# Patient Record
Sex: Female | Born: 1965 | Race: Black or African American | Hispanic: No | Marital: Married | Smoking: Current every day smoker
Health system: Southern US, Community
[De-identification: ages and names within clinical notes are randomized; demographics above are authoritative.]

## PROBLEM LIST (undated history)

## (undated) ENCOUNTER — Emergency Department (HOSPITAL_COMMUNITY): Admission: EM | Source: Home / Self Care

## (undated) DIAGNOSIS — T7840XA Allergy, unspecified, initial encounter: Secondary | ICD-10-CM

## (undated) DIAGNOSIS — I10 Essential (primary) hypertension: Secondary | ICD-10-CM

## (undated) DIAGNOSIS — D649 Anemia, unspecified: Secondary | ICD-10-CM

## (undated) DIAGNOSIS — C801 Malignant (primary) neoplasm, unspecified: Secondary | ICD-10-CM

## (undated) DIAGNOSIS — R7303 Prediabetes: Secondary | ICD-10-CM

## (undated) DIAGNOSIS — O00109 Unspecified tubal pregnancy without intrauterine pregnancy: Secondary | ICD-10-CM

## (undated) DIAGNOSIS — Z789 Other specified health status: Secondary | ICD-10-CM

## (undated) HISTORY — DX: Anemia, unspecified: D64.9

## (undated) HISTORY — DX: Malignant (primary) neoplasm, unspecified: C80.1

## (undated) HISTORY — PX: COLONOSCOPY: SHX174

## (undated) HISTORY — PX: OTHER SURGICAL HISTORY: SHX169

## (undated) HISTORY — PX: MOUTH SURGERY: SHX715

## (undated) HISTORY — DX: Essential (primary) hypertension: I10

## (undated) HISTORY — PX: WISDOM TOOTH EXTRACTION: SHX21

## (undated) HISTORY — PX: TUBAL LIGATION: SHX77

## (undated) HISTORY — PX: COLON SURGERY: SHX602

## (undated) HISTORY — DX: Unspecified tubal pregnancy without intrauterine pregnancy: O00.109

## (undated) HISTORY — DX: Allergy, unspecified, initial encounter: T78.40XA

---

## 1998-04-10 ENCOUNTER — Encounter: Payer: Self-pay | Admitting: Emergency Medicine

## 1998-04-10 ENCOUNTER — Emergency Department (HOSPITAL_COMMUNITY): Admission: EM | Admit: 1998-04-10 | Discharge: 1998-04-10 | Payer: Self-pay | Admitting: Emergency Medicine

## 2002-05-15 ENCOUNTER — Emergency Department (HOSPITAL_COMMUNITY): Admission: EM | Admit: 2002-05-15 | Discharge: 2002-05-15 | Payer: Self-pay | Admitting: *Deleted

## 2002-05-15 ENCOUNTER — Encounter: Payer: Self-pay | Admitting: *Deleted

## 2002-10-23 ENCOUNTER — Encounter: Admission: RE | Admit: 2002-10-23 | Discharge: 2002-10-23 | Payer: Self-pay | Admitting: Obstetrics and Gynecology

## 2003-06-06 ENCOUNTER — Emergency Department (HOSPITAL_COMMUNITY): Admission: EM | Admit: 2003-06-06 | Discharge: 2003-06-06 | Payer: Self-pay | Admitting: Emergency Medicine

## 2003-07-09 ENCOUNTER — Inpatient Hospital Stay (HOSPITAL_COMMUNITY): Admission: AD | Admit: 2003-07-09 | Discharge: 2003-07-09 | Payer: Self-pay | Admitting: *Deleted

## 2003-12-03 ENCOUNTER — Ambulatory Visit (HOSPITAL_COMMUNITY): Admission: RE | Admit: 2003-12-03 | Discharge: 2003-12-03 | Payer: Self-pay | Admitting: *Deleted

## 2008-04-12 ENCOUNTER — Ambulatory Visit (HOSPITAL_COMMUNITY): Admission: RE | Admit: 2008-04-12 | Discharge: 2008-04-12 | Payer: Self-pay | Admitting: Obstetrics

## 2008-04-26 ENCOUNTER — Ambulatory Visit (HOSPITAL_COMMUNITY): Admission: RE | Admit: 2008-04-26 | Discharge: 2008-04-26 | Payer: Self-pay | Admitting: Obstetrics

## 2011-06-27 ENCOUNTER — Inpatient Hospital Stay (HOSPITAL_COMMUNITY): Payer: Self-pay

## 2011-06-27 ENCOUNTER — Encounter (HOSPITAL_COMMUNITY): Payer: Self-pay | Admitting: *Deleted

## 2011-06-27 ENCOUNTER — Inpatient Hospital Stay (HOSPITAL_COMMUNITY)
Admission: AD | Admit: 2011-06-27 | Discharge: 2011-06-27 | Disposition: A | Discharge status: Home / Self Care | Payer: Self-pay | Source: Ambulatory Visit | Attending: Obstetrics and Gynecology | Admitting: Obstetrics and Gynecology

## 2011-06-27 DIAGNOSIS — N946 Dysmenorrhea, unspecified: Secondary | ICD-10-CM | POA: Insufficient documentation

## 2011-06-27 DIAGNOSIS — R1031 Right lower quadrant pain: Secondary | ICD-10-CM | POA: Insufficient documentation

## 2011-06-27 DIAGNOSIS — D259 Leiomyoma of uterus, unspecified: Secondary | ICD-10-CM | POA: Insufficient documentation

## 2011-06-27 DIAGNOSIS — N945 Secondary dysmenorrhea: Secondary | ICD-10-CM

## 2011-06-27 HISTORY — DX: Other specified health status: Z78.9

## 2011-06-27 LAB — HCG, SERUM, QUALITATIVE: Preg, Serum: NEGATIVE

## 2011-06-27 LAB — URINALYSIS, ROUTINE W REFLEX MICROSCOPIC
Bilirubin Urine: NEGATIVE
Specific Gravity, Urine: 1.025 (ref 1.005–1.030)
pH: 6 (ref 5.0–8.0)

## 2011-06-27 LAB — CBC
Hemoglobin: 12.4 g/dL (ref 12.0–15.0)
MCH: 28.5 pg (ref 26.0–34.0)
MCHC: 34.2 g/dL (ref 30.0–36.0)
RDW: 14.6 % (ref 11.5–15.5)

## 2011-06-27 LAB — URINE MICROSCOPIC-ADD ON

## 2011-06-27 MED ORDER — OXYCODONE-ACETAMINOPHEN 5-325 MG PO TABS
2.0000 | ORAL_TABLET | Freq: Once | ORAL | Status: AC
  Administered 2011-06-27: 2 via ORAL
  Filled 2011-06-27: qty 2

## 2011-06-27 MED ORDER — OXYCODONE-ACETAMINOPHEN 5-325 MG PO TABS: 2.0000 | ORAL_TABLET | Freq: Four times a day (QID) | ORAL | Status: AC | PRN

## 2011-06-27 MED ORDER — PROMETHAZINE HCL 25 MG/ML IJ SOLN
12.5000 mg | Freq: Once | INTRAMUSCULAR | Status: AC
  Administered 2011-06-27: 12.5 mg via INTRAMUSCULAR
  Filled 2011-06-27: qty 1

## 2011-06-27 MED ORDER — MORPHINE SULFATE 4 MG/ML IJ SOLN
2.0000 mg | Freq: Once | INTRAMUSCULAR | Status: AC
  Administered 2011-06-27: 2 mg via INTRAMUSCULAR
  Filled 2011-06-27: qty 1

## 2011-06-27 NOTE — ED Provider Notes (Signed)
Chief Complaint:  Abdominal Pain   Shelby Wyatt is  46 y.o. No obstetric history on file..  Patient's last menstrual period was 06/25/2011.Marland Kitchen  Her pregnancy status is negative.  She presents complaining of Abdominal Pain . Onset is described as sudden and has been present for  2 hours. Pt reports right-sided abd pain that feels "just like 2 previous ectopic pregnancies". States period started on time. Changing pad 3-4 times daily. Reports dx of fibroids by Dr. Gaynell Face in 2009.  Obstetrical/Gynecological History: [id number]  1992: Ectopic  2009: Ectopic  Past Medical History: Past Medical History  Diagnosis Date  . No pertinent past medical history     Past Surgical History: Past Surgical History  Procedure Date  . Laporoscopy     Family History: Family History  Problem Relation Age of Onset  . Diabetes Mother     Social History: History  Substance Use Topics  . Smoking status: Current Everyday Smoker  . Smokeless tobacco: Not on file  . Alcohol Use: No    Allergies: No Known Allergies  Prescriptions prior to admission  Medication Sig Dispense Refill  . naproxen sodium (ANAPROX) 220 MG tablet Take 220 mg by mouth 2 (two) times daily with a meal. pain        Review of Systems - Gastrointestinal ROS: positive for - abdominal pain negative for - nausea/vomiting Genito-Urinary ROS: no dysuria, trouble voiding, or hematuria positive for - menses  Physical Exam   Blood pressure 137/77, pulse 80, temperature 99.4 F (37.4 C), resp. rate 18, height 5\' 2"  (1.575 m), last menstrual period 06/25/2011.  General: General appearance - alert, well appearing, and in no distress, oriented to person, place, and time, overweight and uncomfortable appearing Mental status - alert, oriented to person, place, and time, normal mood, behavior, speech, dress, motor activity, and thought processes, affect appropriate to mood Abdomen - tenderness noted diffuse, moderate, soft abd.  +BS no CVA tenderness Pelvic - small amount of blood noted c/w mense  Labs: Recent Results (from the past 24 hour(s))  URINALYSIS, ROUTINE W REFLEX MICROSCOPIC   Collection Time   06/27/11  9:10 PM      Component Value Range   Color, Urine YELLOW  YELLOW    APPearance HAZY (*) CLEAR    Specific Gravity, Urine 1.025  1.005 - 1.030    pH 6.0  5.0 - 8.0    Glucose, UA NEGATIVE  NEGATIVE (mg/dL)   Hgb urine dipstick TRACE (*) NEGATIVE    Bilirubin Urine NEGATIVE  NEGATIVE    Ketones, ur 15 (*) NEGATIVE (mg/dL)   Protein, ur NEGATIVE  NEGATIVE (mg/dL)   Urobilinogen, UA 1.0  0.0 - 1.0 (mg/dL)   Nitrite NEGATIVE  NEGATIVE    Leukocytes, UA NEGATIVE  NEGATIVE   URINE MICROSCOPIC-ADD ON   Collection Time   06/27/11  9:10 PM      Component Value Range   Squamous Epithelial / LPF RARE  RARE    WBC, UA 0-2  <3 (WBC/hpf)   RBC / HPF 0-2  <3 (RBC/hpf)   Urine-Other MUCOUS PRESENT    CBC   Collection Time   06/27/11  9:15 PM      Component Value Range   WBC 7.8  4.0 - 10.5 (K/uL)   RBC 4.35  3.87 - 5.11 (MIL/uL)   Hemoglobin 12.4  12.0 - 15.0 (g/dL)   HCT 28.4  13.2 - 44.0 (%)   MCV 83.4  78.0 - 100.0 (fL)  MCH 28.5  26.0 - 34.0 (pg)   MCHC 34.2  30.0 - 36.0 (g/dL)   RDW 14.7  82.9 - 56.2 (%)   Platelets 216  150 - 400 (K/uL)  HCG, SERUM, QUALITATIVE   Collection Time   06/27/11  9:15 PM      Component Value Range   Preg, Serum NEGATIVE  NEGATIVE    Imaging Studies:  *RADIOLOGY REPORT*  Clinical Data: Uterine fibroids, right lower quadrant pain  TRANSABDOMINAL AND TRANSVAGINAL ULTRASOUND OF PELVIS Technique: Both transabdominal and transvaginal ultrasound examinations of the pelvis were performed. Transabdominal technique was performed for global imaging of the pelvis including uterus, ovaries, adnexal regions, and pelvic cul-de-sac.  Comparison: 07/09/2003 It was necessary to proceed with endovaginal exam following the transabdominal exam to visualize the uterus and  better detail.  Findings: Uterus: Uterus measures 10.1 x 6.0 x 7.7 cm. Numerous uterine fibroids noted. Largest uterine fibroid is on the right side and fundal in location measuring 3.6 x 3.3 x 4.2 cm. At least six total fibroids demonstrated which are predominately 3 cm or less in size.  Endometrium: Normal appearance. 3.7 mm thickness.  Right ovary: Measures 2.2 x 1.2 x 1.2 cm. No focal abnormality. Small follicles noted.  Left ovary: Measures 2.5 x 1.2 x 2.3 cm. No focal abnormality.  Trace physiologic free fluid.  Other findings: No free fluid  IMPRESSION: Multiple uterine fibroids.  Normal endometrium and ovaries.  Trace physiologic free fluid.  No acute finding by ultrasound.  Original Report Authenticated By: Judie Petit. Ruel Favors, M.D.   Assessment: Fibroid Uterus Dysmenorrea  Plan: Discharge home Rx pain percocet FU with Dr. Gaynell Face or [facility] E. 06/27/2011,11:03 PM

## 2011-06-27 NOTE — Progress Notes (Signed)
Pt states "I feel like I am having another ectopic pregnancy", pain rt lower abd off/on since yesterday, eased up and came back around 1830, period started 01/11 and became heavy yesterday. Changing a pad about every 2 1/2 hours. LMP in December 15. History of ectopic x 2, has had one on each side but did not remove tubes or ovaries.

## 2011-06-30 NOTE — ED Provider Notes (Signed)
Attestation of Attending Supervision of Advanced Practitioner: Evaluation and management procedures were performed by the PA/NP/CNM/OB Fellow under my supervision/collaboration. Chart reviewed and agree with management and plan.  Akya Fiorello V 06/30/2011 11:58 AM

## 2014-04-15 ENCOUNTER — Encounter (HOSPITAL_COMMUNITY): Payer: Self-pay | Admitting: *Deleted

## 2018-01-28 ENCOUNTER — Ambulatory Visit (INDEPENDENT_AMBULATORY_CARE_PROVIDER_SITE_OTHER): Payer: BLUE CROSS/BLUE SHIELD

## 2018-01-28 ENCOUNTER — Ambulatory Visit (INDEPENDENT_AMBULATORY_CARE_PROVIDER_SITE_OTHER): Payer: BLUE CROSS/BLUE SHIELD | Admitting: Podiatry

## 2018-01-28 ENCOUNTER — Ambulatory Visit: Payer: BLUE CROSS/BLUE SHIELD

## 2018-01-28 ENCOUNTER — Other Ambulatory Visit: Payer: Self-pay | Admitting: Podiatry

## 2018-01-28 ENCOUNTER — Encounter: Payer: Self-pay | Admitting: Podiatry

## 2018-01-28 DIAGNOSIS — M722 Plantar fascial fibromatosis: Secondary | ICD-10-CM

## 2018-01-28 MED ORDER — METHYLPREDNISOLONE 4 MG PO TBPK: ORAL_TABLET | ORAL | 0 refills | Status: DC

## 2018-01-28 MED ORDER — MELOXICAM 15 MG PO TABS: 15.0000 mg | ORAL_TABLET | Freq: Every day | ORAL | 1 refills | Status: DC

## 2018-02-01 NOTE — Progress Notes (Signed)
   Subjective: 52 year old female presenting today as a new patient with a chief complaint of bilateral heel pain that began a few years ago. She states the left heel is worse than the right and the pain has worsened over the past year. She states the pain is worse when standing after sitting for a long period of time. Walking and standing for long periods of time also increases the pain. She has been applying ice therapy with some temporary relief. Patient is here for further evaluation and treatment.   Past Medical History:  Diagnosis Date  . No pertinent past medical history      Objective: Physical Exam General: The patient is alert and oriented x3 in no acute distress.  Dermatology: Skin is warm, dry and supple bilateral lower extremities. Negative for open lesions or macerations bilateral.   Vascular: Dorsalis Pedis and Posterior Tibial pulses palpable bilateral.  Capillary fill time is immediate to all digits.  Neurological: Epicritic and protective threshold intact bilateral.   Musculoskeletal: Tenderness to palpation to the plantar aspect of the bilateral heels along the plantar fascia. All other joints range of motion within normal limits bilateral. Strength 5/5 in all groups bilateral.   Radiographic exam: Normal osseous mineralization. Joint spaces preserved. No fracture/dislocation/boney destruction. No other soft tissue abnormalities or radiopaque foreign bodies.   Assessment: 1. plantar fasciitis bilateral feet  Plan of Care:  1. Patient evaluated. Xrays reviewed.   2. Injection of 0.5cc Celestone soluspan injected into the bilateral heels.  3. Rx for Medrol Dose Pak placed 4. Rx for Meloxicam ordered for patient. 5. Plantar fascial band(s) dispensed for bilateral plantar fasciitis. 6. Instructed patient regarding therapies and modalities at home to alleviate symptoms.  7. Return to clinic in 4 weeks.    Edrick Kins, DPM [facility]  Dr. Edrick Kins, DPM    [address], [place] [address]                Office [phone]  Fax [phone]

## 2018-02-27 ENCOUNTER — Ambulatory Visit: Payer: BLUE CROSS/BLUE SHIELD | Admitting: Podiatry

## 2018-03-13 ENCOUNTER — Ambulatory Visit (INDEPENDENT_AMBULATORY_CARE_PROVIDER_SITE_OTHER): Payer: BLUE CROSS/BLUE SHIELD | Admitting: Podiatry

## 2018-03-13 DIAGNOSIS — B353 Tinea pedis: Secondary | ICD-10-CM

## 2018-03-13 DIAGNOSIS — M722 Plantar fascial fibromatosis: Secondary | ICD-10-CM | POA: Diagnosis not present

## 2018-03-13 MED ORDER — TERBINAFINE HCL 250 MG PO TABS: 250.0000 mg | ORAL_TABLET | Freq: Every day | ORAL | 0 refills | Status: DC

## 2018-03-13 MED ORDER — CLOTRIMAZOLE-BETAMETHASONE 1-0.05 % EX CREA: 1.0000 "application " | TOPICAL_CREAM | Freq: Two times a day (BID) | CUTANEOUS | 1 refills | Status: DC

## 2018-03-13 NOTE — Progress Notes (Signed)
   Subjective: 52 year old female presenting today for follow-up treatment evaluation regarding plantar fasciitis to the bilateral feet.  Patient states that she did good for about a month however the pain slowly returned.  Patient recently went to the beach and walked to the [place] walk for several miles.  Is aggravated her plantar fasciitis.  She is currently back to her original pain level. Patient also has a new complaint regarding burning and itching between the toes that is been ongoing for several years.  She notices skin peeling to the plantar aspect of the digits.  Patient denies trauma.  Past Medical History:  Diagnosis Date  . No pertinent past medical history      Objective: Physical Exam General: The patient is alert and oriented x3 in no acute distress.  Dermatology: Skin is warm, dry and supple bilateral lower extremities. Negative for open lesions or macerations bilateral.  Diffuse hyperkeratotic skin noted to the interdigital areas of the bilateral feet with peeling.  Pruritus also noted.  Vascular: Dorsalis Pedis and Posterior Tibial pulses palpable bilateral.  Capillary fill time is immediate to all digits.  Neurological: Epicritic and protective threshold intact bilateral.   Musculoskeletal: Tenderness to palpation to the plantar aspect of the bilateral heels along the plantar fascia. All other joints range of motion within normal limits bilateral. Strength 5/5 in all groups bilateral.   Radiographic exam: Normal osseous mineralization. Joint spaces preserved. No fracture/dislocation/boney destruction. No other soft tissue abnormalities or radiopaque foreign bodies.   Assessment: 1. plantar fasciitis bilateral feet 2.  Tinea pedis bilateral feet  Plan of Care:  1. Patient evaluated. Xrays reviewed.   2. Injection of 0.5cc Celestone soluspan injected into the bilateral heels.  3.  Continue oral meloxicam daily 4.  Appointment with Liliane Channel, Pedorthist, for custom  molded orthotics 5.  Prescription for Lamisil 250 mg x 2 months 6.  Prescription for Lotrisone cream 7.  Return to clinic in 8 weeks  *Husband also states that he is athlete's foot as well  Edrick Kins, DPM [facility]  Dr. Edrick Kins, DPM    [address], [place] [address]                Office [phone]  Fax [phone]

## 2018-03-20 ENCOUNTER — Other Ambulatory Visit: Payer: BLUE CROSS/BLUE SHIELD | Admitting: Orthotics

## 2018-03-20 DIAGNOSIS — M722 Plantar fascial fibromatosis: Secondary | ICD-10-CM | POA: Diagnosis not present

## 2018-04-10 ENCOUNTER — Ambulatory Visit: Payer: BLUE CROSS/BLUE SHIELD | Admitting: Orthotics

## 2018-04-10 DIAGNOSIS — M722 Plantar fascial fibromatosis: Secondary | ICD-10-CM

## 2018-04-10 NOTE — Progress Notes (Signed)
Patient came in today to pick up custom made foot orthotics.  The goals were accomplished and the patient reported no dissatisfaction with said orthotics.  Patient was advised of breakin period and how to report any issues. 

## 2018-04-14 ENCOUNTER — Telehealth: Payer: Self-pay | Admitting: Podiatry

## 2018-04-14 NOTE — Telephone Encounter (Signed)
I spoke with pt and told her she should stay off of the Lamisil and go to a PCP for the rash, which may or may not be related to the terbinafine. Pt states she does not have a PCP and I directed her to the urgent care and family practice on [address].

## 2018-04-14 NOTE — Telephone Encounter (Signed)
I've been taking the terbinafine prescribed by Dr. Amalia Hailey. I read where it said to stop taking if you have stomach problems which I'm experiencing. I stopped taking them about two weeks ago. I know have a rash under my stomach and other areas. I just need to know what to do. I can be reached at [phone].

## 2018-04-17 DIAGNOSIS — T7840XA Allergy, unspecified, initial encounter: Secondary | ICD-10-CM | POA: Diagnosis not present

## 2018-05-08 ENCOUNTER — Ambulatory Visit (INDEPENDENT_AMBULATORY_CARE_PROVIDER_SITE_OTHER): Payer: BLUE CROSS/BLUE SHIELD | Admitting: Podiatry

## 2018-05-08 ENCOUNTER — Encounter: Payer: Self-pay | Admitting: Podiatry

## 2018-05-08 DIAGNOSIS — M722 Plantar fascial fibromatosis: Secondary | ICD-10-CM

## 2018-05-08 DIAGNOSIS — B351 Tinea unguium: Secondary | ICD-10-CM | POA: Diagnosis not present

## 2018-05-08 DIAGNOSIS — B353 Tinea pedis: Secondary | ICD-10-CM | POA: Diagnosis not present

## 2018-05-08 DIAGNOSIS — M79676 Pain in unspecified toe(s): Secondary | ICD-10-CM

## 2018-05-09 NOTE — Progress Notes (Signed)
   Subjective: 52 year old female presenting today for follow-up treatment evaluation regarding plantar fasciitis as well as tinea pedis of bilateral feet. She states the pain has improved but is still present on occasion. She states some days are better than others. She reports getting hives three weeks ago after starting the Lamisil so she discontinued use. She has been using the Lotrisone cream as directed without issue.  She also reports elongated, thickened nails of bilateral feet that cause pain while ambulating in shoes. She is unable to trim her own nails. Patient is here for further evaluation and treatment.   Past Medical History:  Diagnosis Date  . No pertinent past medical history      Objective: Physical Exam General: The patient is alert and oriented x3 in no acute distress.  Dermatology: Skin is warm, dry and supple bilateral lower extremities. Negative for open lesions or macerations bilateral. Diffuse hyperkeratotic skin and pruritis noted to the interdigital areas of the bilateral feet with peeling. Nails are tender, long, thickened and dystrophic with subungual debris, consistent with onychomycosis, 1-5 bilateral. No signs of infection noted.  Vascular: Dorsalis Pedis and Posterior Tibial pulses palpable bilateral.  Capillary fill time is immediate to all digits.  Neurological: Epicritic and protective threshold intact bilateral.   Musculoskeletal: Tenderness to palpation to the plantar aspect of the bilateral heels along the plantar fascia. All other joints range of motion within normal limits bilateral. Strength 5/5 in all groups bilateral.   Assessment: 1. plantar fasciitis bilateral feet 2. Tinea pedis bilateral feet 3. Onychodystrophic nails 1-5 bilateral with hyperkeratosis of nails.  4. Onychomycosis of nail due to dermatophyte bilateral  Plan of Care:  1. Patient evaluated.    2. Continue using custom orthotics.  3. Continue taking Meloxicam.  4.  Discontinue taking Lamisil oral tablets due to allergy.  5. Continue using Lotrisone cream.  6. Mechanical debridement of nails 1-5 bilaterally performed using a nail nipper. Filed with dremel without incident.  7. Return to clinic as needed.   Edrick Kins, DPM [facility]  Dr. Edrick Kins, DPM    [address], [place] [address]                Office [phone]  Fax [phone]

## 2018-05-10 ENCOUNTER — Telehealth: Payer: Self-pay

## 2018-05-10 NOTE — Telephone Encounter (Signed)
Faxed reill request for terbinafine 250mg  denied due to patient allergy

## 2018-10-20 ENCOUNTER — Other Ambulatory Visit: Payer: Self-pay

## 2018-10-20 ENCOUNTER — Emergency Department (HOSPITAL_COMMUNITY)
Admission: EM | Admit: 2018-10-20 | Discharge: 2018-10-21 | Disposition: A | Discharge status: Home / Self Care | Payer: Self-pay | Attending: Emergency Medicine | Admitting: Emergency Medicine

## 2018-10-20 ENCOUNTER — Encounter (HOSPITAL_COMMUNITY): Payer: Self-pay | Admitting: Emergency Medicine

## 2018-10-20 ENCOUNTER — Emergency Department (HOSPITAL_COMMUNITY): Payer: Self-pay

## 2018-10-20 DIAGNOSIS — R0789 Other chest pain: Secondary | ICD-10-CM | POA: Insufficient documentation

## 2018-10-20 DIAGNOSIS — R7309 Other abnormal glucose: Secondary | ICD-10-CM | POA: Insufficient documentation

## 2018-10-20 DIAGNOSIS — F17210 Nicotine dependence, cigarettes, uncomplicated: Secondary | ICD-10-CM | POA: Diagnosis not present

## 2018-10-20 LAB — I-STAT BETA HCG BLOOD, ED (MC, WL, AP ONLY): I-stat hCG, quantitative: <5 m[IU]/mL (ref <5)

## 2018-10-20 MED ORDER — SODIUM CHLORIDE 0.9% FLUSH: 3.0000 mL | Freq: Once | INTRAVENOUS | Status: DC

## 2018-10-20 NOTE — ED Triage Notes (Signed)
Pt c/o chest pain under L breast, intermittent since last night. Slight shob with exertion, denies n/v/d, denies fever.  Pt reports increase in diaphoresis recently.

## 2018-10-21 LAB — CBC
HCT: 35.5 % — ABNORMAL LOW (ref 36.0–46.0)
Hemoglobin: 10.6 g/dL — ABNORMAL LOW (ref 12.0–15.0)
MCH: 21.1 pg — ABNORMAL LOW (ref 26.0–34.0)
MCHC: 29.9 g/dL — ABNORMAL LOW (ref 30.0–36.0)
MCV: 70.7 fL — ABNORMAL LOW (ref 80.0–100.0)
Platelets: 323 10*3/uL (ref 150–400)
RBC: 5.02 MIL/uL (ref 3.87–5.11)
RDW: 19.2 % — ABNORMAL HIGH (ref 11.5–15.5)
WBC: 7.6 10*3/uL (ref 4.0–10.5)
nRBC: 0 % (ref 0.0–0.2)

## 2018-10-21 LAB — BASIC METABOLIC PANEL
Anion gap: 13 (ref 5–15)
BUN: 9 mg/dL (ref 6–20)
CO2: 25 mmol/L (ref 22–32)
Calcium: 9.4 mg/dL (ref 8.9–10.3)
Chloride: 100 mmol/L (ref 98–111)
Creatinine, Ser: 0.75 mg/dL (ref 0.44–1.00)
GFR calc Af Amer: >60 mL/min (ref >60)
GFR calc non Af Amer: >60 mL/min (ref >60)
Glucose, Bld: 154 mg/dL — ABNORMAL HIGH (ref 70–99)
Potassium: 3.6 mmol/L (ref 3.5–5.1)
Sodium: 138 mmol/L (ref 135–145)

## 2018-10-21 LAB — TROPONIN I: Troponin I: <0.03 ng/mL (ref <0.03)

## 2018-10-21 NOTE — ED Provider Notes (Signed)
[facility] EMERGENCY DEPARTMENT Provider Note   CSN: [id number] Arrival date & time: 10/20/18  2237    History   Chief Complaint Chief Complaint  Patient presents with  . Chest Pain    HPI Shelby Wyatt is a 53 y.o. female.   The history is provided by the patient.  Chest Pain  She has no significant past history, and comes in because of episodic chest pain which started yesterday.  She stretched her arms and felt a sharp pain in the left parasternal area which only lasted a few seconds before resolving.  She was fine and till during the day today, she intermittently had pain in the same area.  Pain would be sharp and only last a few seconds before resolving.  There is no radiation of pain.  Pain is rated at 6/10 when it is present.  She denies dyspnea, nausea, diaphoresis.  She denies any fever or cough.  She denies exposure to anyone with COVID-19.  There is no history of diabetes, hypertension, hyperlipidemia.  There is no family history of premature coronary atherosclerosis in first-degree relatives.  She is a cigarette smoker admitting to smoking about half pack of cigarettes a day.  She is never had any pain like this before.  Past Medical History:  Diagnosis Date  . No pertinent past medical history     There are no active problems to display for this patient.   Past Surgical History:  Procedure Laterality Date  . laporoscopy       OB History    Gravida  2   Para      Term      Preterm      AB  2   Living        SAB      TAB      Ectopic  2   Multiple      Live Births               Home Medications    Prior to Admission medications   Medication Sig Start Date End Date Taking? Authorizing Provider  clotrimazole-betamethasone (LOTRISONE) cream Apply 1 application topically 2 (two) times daily. 03/13/18   Edrick Kins, DPM  methylPREDNISolone (MEDROL DOSEPAK) 4 MG TBPK tablet 6 day dose pack - take as directed 01/28/18    Edrick Kins, DPM  naproxen sodium (ALEVE) 220 MG tablet Take 220 mg by mouth.    [provider]  terbinafine (LAMISIL) 250 MG tablet Take 1 tablet (250 mg total) by mouth daily. Patient not taking: Reported on 05/08/2018 03/13/18   Edrick Kins, DPM    Family History Family History  Problem Relation Age of Onset  . Diabetes Mother     Social History Social History   Tobacco Use  . Smoking status: Current Every Day Smoker  . Smokeless tobacco: Never Used  Substance Use Topics  . Alcohol use: No  . Drug use: No     Allergies   Terbinafine and related   Review of Systems Review of Systems  Cardiovascular: Positive for chest pain.  All other systems reviewed and are negative.    Physical Exam Updated Vital Signs BP 131/68 (BP Location: Right Arm)   Pulse 80   Temp 98.4 F (36.9 C)   Resp 18   Ht 5\' 2"  (1.575 m)   Wt 74.8 kg   LMP 10/13/2018   SpO2 100%   BMI 30.18 kg/m   Physical  Exam Vitals signs and nursing note reviewed.    53 year old female, resting comfortably and in no acute distress. Vital signs are normal. Oxygen saturation is 100%, which is normal. Head is normocephalic and atraumatic. PERRLA, EOMI. Oropharynx is clear. Neck is nontender and supple without adenopathy or JVD. Back is nontender and there is no CVA tenderness. Lungs are clear without rales, wheezes, or rhonchi. Chest has point tenderness in the left parasternal area which does reproduce her pain.  There is no crepitus. Heart has regular rate and rhythm without murmur. Abdomen is soft, flat, nontender without masses or hepatosplenomegaly and peristalsis is normoactive. Extremities have no cyanosis or edema, full range of motion is present. Skin is warm and dry without rash. Neurologic: Mental status is normal, cranial nerves are intact, there are no motor or sensory deficits.  ED Treatments / Results  Labs (all labs ordered are listed, but only abnormal results are  displayed) Labs Reviewed  BASIC METABOLIC PANEL - Abnormal; Notable for the following components:      Result Value   Glucose, Bld 154 (*)    All other components within normal limits  CBC - Abnormal; Notable for the following components:   Hemoglobin 10.6 (*)    HCT 35.5 (*)    MCV 70.7 (*)    MCH 21.1 (*)    MCHC 29.9 (*)    RDW 19.2 (*)    All other components within normal limits  TROPONIN I  I-STAT BETA HCG BLOOD, ED ([facility], [facility], [facility] ONLY)    EKG None  Radiology Dg Chest 2 View  Result Date: 10/20/2018 CLINICAL DATA:  Chest pain EXAM: CHEST - 2 VIEW COMPARISON:  None. FINDINGS: The heart size and mediastinal contours are within normal limits. Both lungs are clear. The visualized skeletal structures are unremarkable. IMPRESSION: No active cardiopulmonary disease. Electronically Signed   By: Inez Catalina M.D.   On: 10/20/2018 23:46    Procedures Procedures   Medications Ordered in ED Medications  sodium chloride flush (NS) 0.9 % injection 3 mL (has no administration in time range)     Initial Impression / Assessment and Plan / ED Course  I have reviewed the triage vital signs and the nursing notes.  Pertinent labs & imaging results that were available during my care of the patient were reviewed by me and considered in my medical decision making (see chart for details).  Chest pain which seems to be chest wall pain.  Pain is reproducible with an very well localized tenderness.  Pattern not suggestive of ACS/angina.  Low index of suspicion for pulmonary embolism or aortic dissection, and no risk factors for either of these.  Labs show normal troponin, mild microcytic anemia, borderline hyperglycemia.  Chest x-ray showed no acute process and ECG is unremarkable.  Heart score is 2, which puts her at low risk for major adverse cardiac events in the next 6 weeks.  Patient is advised of this and advised to use over-the-counter NSAIDs as well as topical measures such as ice.  Return  precautions discussed.  Encouraged her to stop smoking.  Final Clinical Impressions(s) / ED Diagnoses   Final diagnoses:  Chest wall pain  Elevated glucose    ED Discharge Orders    None       Delora Fuel, MD 88/41/66 [phone]

## 2018-10-21 NOTE — Discharge Instructions (Addendum)
Apply ice as needed.  Take ibuprofen or naproxen as needed.  Your blood sugar today was high - 154.  This will need to be watched closely.  If it stays consistently elevated or goes higher, you might need to go on a special diet or go on medication for diabetes.

## 2018-12-21 ENCOUNTER — Other Ambulatory Visit: Payer: Self-pay | Admitting: Podiatry

## 2018-12-21 NOTE — Telephone Encounter (Signed)
LVM to pt advising of her meloxicam refill sent to her pharmacy

## 2018-12-21 NOTE — Telephone Encounter (Signed)
DR. March Rummage as Dr. On call please advice

## 2019-08-10 DIAGNOSIS — Z1151 Encounter for screening for human papillomavirus (HPV): Secondary | ICD-10-CM | POA: Diagnosis not present

## 2019-08-10 DIAGNOSIS — Z3200 Encounter for pregnancy test, result unknown: Secondary | ICD-10-CM | POA: Diagnosis not present

## 2019-08-10 DIAGNOSIS — Z113 Encounter for screening for infections with a predominantly sexual mode of transmission: Secondary | ICD-10-CM | POA: Diagnosis not present

## 2019-08-10 DIAGNOSIS — Z01419 Encounter for gynecological examination (general) (routine) without abnormal findings: Secondary | ICD-10-CM | POA: Diagnosis not present

## 2019-08-10 DIAGNOSIS — Z1231 Encounter for screening mammogram for malignant neoplasm of breast: Secondary | ICD-10-CM | POA: Diagnosis not present

## 2019-08-10 DIAGNOSIS — Z114 Encounter for screening for human immunodeficiency virus [HIV]: Secondary | ICD-10-CM | POA: Diagnosis not present

## 2019-08-10 DIAGNOSIS — Z6831 Body mass index (BMI) 31.0-31.9, adult: Secondary | ICD-10-CM | POA: Diagnosis not present

## 2019-08-10 DIAGNOSIS — N939 Abnormal uterine and vaginal bleeding, unspecified: Secondary | ICD-10-CM | POA: Diagnosis not present

## 2019-08-10 DIAGNOSIS — Z1159 Encounter for screening for other viral diseases: Secondary | ICD-10-CM | POA: Diagnosis not present

## 2019-08-10 DIAGNOSIS — Z118 Encounter for screening for other infectious and parasitic diseases: Secondary | ICD-10-CM | POA: Diagnosis not present

## 2019-09-10 DIAGNOSIS — N939 Abnormal uterine and vaginal bleeding, unspecified: Secondary | ICD-10-CM | POA: Diagnosis not present

## 2019-09-21 ENCOUNTER — Ambulatory Visit: Payer: Self-pay | Attending: Internal Medicine

## 2019-09-21 DIAGNOSIS — Z23 Encounter for immunization: Secondary | ICD-10-CM

## 2019-09-21 NOTE — Progress Notes (Signed)
   Covid-19 Vaccination Clinic  Name:  Shelby Wyatt    MRN: [id number] DOB: [birth year]  09/21/2019  Ms. [name] was observed post Covid-19 immunization for 15 minutes without incident. She was provided with Vaccine Information Sheet and instruction to access the V-Safe system.   Ms. [name] was instructed to call 911 with any severe reactions post vaccine: Marland Kitchen Difficulty breathing  . Swelling of face and throat  . A fast heartbeat  . A bad rash all over body  . Dizziness and weakness   Immunizations Administered    Name Date Dose VIS Date Route   Pfizer COVID-19 Vaccine 09/21/2019 10:35 AM 0.3 mL 05/25/2019 Intramuscular   Manufacturer: [employer]   Lot: [id number]   [place]: [id number]

## 2019-10-16 ENCOUNTER — Ambulatory Visit: Payer: Self-pay | Attending: Internal Medicine

## 2019-10-16 DIAGNOSIS — Z23 Encounter for immunization: Secondary | ICD-10-CM

## 2019-10-16 NOTE — Progress Notes (Signed)
   Covid-19 Vaccination Clinic  Name:  REID GARNO    MRN: [id number] DOB: 1966/05/08  10/16/2019  Ms. Musch was observed post Covid-19 immunization for 15 minutes without incident. She was provided with Vaccine Information Sheet and instruction to access the V-Safe system.   Ms. Gray was instructed to call 911 with any severe reactions post vaccine: Marland Kitchen Difficulty breathing  . Swelling of face and throat  . A fast heartbeat  . A bad rash all over body  . Dizziness and weakness   Immunizations Administered    Name Date Dose VIS Date Route   Pfizer COVID-19 Vaccine 10/16/2019 10:43 AM 0.3 mL 08/08/2018 Intramuscular   Manufacturer: [place]   Lot: [id number]   [place]: [id number]

## 2020-06-23 ENCOUNTER — Encounter (HOSPITAL_COMMUNITY): Payer: Self-pay

## 2020-06-23 ENCOUNTER — Ambulatory Visit (INDEPENDENT_AMBULATORY_CARE_PROVIDER_SITE_OTHER): Payer: Worker's Compensation

## 2020-06-23 ENCOUNTER — Ambulatory Visit (HOSPITAL_COMMUNITY)
Admission: EM | Admit: 2020-06-23 | Discharge: 2020-06-23 | Disposition: A | Discharge status: Home / Self Care | Payer: Worker's Compensation | Attending: Family Medicine | Admitting: Family Medicine

## 2020-06-23 ENCOUNTER — Other Ambulatory Visit: Payer: Self-pay

## 2020-06-23 DIAGNOSIS — M25421 Effusion, right elbow: Secondary | ICD-10-CM | POA: Diagnosis not present

## 2020-06-23 DIAGNOSIS — S5001XA Contusion of right elbow, initial encounter: Secondary | ICD-10-CM

## 2020-06-23 DIAGNOSIS — M25521 Pain in right elbow: Secondary | ICD-10-CM

## 2020-06-23 DIAGNOSIS — R2231 Localized swelling, mass and lump, right upper limb: Secondary | ICD-10-CM

## 2020-06-23 NOTE — ED Triage Notes (Signed)
Patient states she was pushing a cart and her right elbow got hit by a door. Pt complains of pain when she flexes her had as well as straightening her elbow. Pt complains of some swelling as well. Pt is aox4 and ambulatory.

## 2020-06-23 NOTE — Discharge Instructions (Addendum)
Limit use of arm while painful Take aleve 2 tabs morning and night with food Use ice to area to reduce pain and swelling Follow up with health at work

## 2020-06-23 NOTE — ED Provider Notes (Signed)
[facility]    CSN: [id number] Arrival date & time: 06/23/20  1547      History   Chief Complaint Chief Complaint  Patient presents with  . Elbow Injury    Occurred this am    HPI Shelby Wyatt is a 55 y.o. female.   HPI   Patient states that she was at work today.  She was pushing a cart through a doorway and a heavy freezer door hit her right on the elbow.  It caught her elbow between the car to the door.  She had immediate pain.  She was able to keep working but later noticed that when she tried to use her right arm or pick anything up it was painful in her elbow.  Her manager told her to come here for evaluation. Patient states she is in good health.  She is not on any prescription medications.  Past Medical History:  Diagnosis Date  . No pertinent past medical history     There are no problems to display for this patient.   Past Surgical History:  Procedure Laterality Date  . laporoscopy      OB History    Gravida  2   Para      Term      Preterm      AB  2   Living        SAB      IAB      Ectopic  2   Multiple      Live Births               Home Medications    Prior to Admission medications   Medication Sig Start Date End Date Taking? Authorizing Provider  naproxen sodium (ALEVE) 220 MG tablet Take 220 mg by mouth.    [provider]    Family History Family History  Problem Relation Age of Onset  . Diabetes Mother     Social History Social History   Tobacco Use  . Smoking status: Current Every Day Smoker  . Smokeless tobacco: Never Used  Vaping Use  . Vaping Use: Never used  Substance Use Topics  . Alcohol use: No  . Drug use: No     Allergies   Terbinafine and related   Review of Systems Review of Systems See HPI  Physical Exam Triage Vital Signs ED Triage Vitals  Enc Vitals Group     BP 06/23/20 1650 139/80     Pulse Rate 06/23/20 1650 75     Resp 06/23/20 1650 18     Temp  06/23/20 1650 98 F (36.7 C)     Temp Source 06/23/20 1650 Oral     SpO2 06/23/20 1650 99 %     Weight --      Height --      Head Circumference --      Peak Flow --      Pain Score 06/23/20 1652 6     Pain Loc --      Pain Edu? --      Excl. in [place]? --    No data found.  Updated Vital Signs BP 139/80 (BP Location: Right Arm)   Pulse 75   Temp 98 F (36.7 C) (Oral)   Resp 18   LMP 04/29/2020 (Approximate)   SpO2 99%      Physical Exam Constitutional:      General: She is not in acute distress.    Appearance:  She is well-developed and well-nourished.  HENT:     Head: Normocephalic and atraumatic.     Mouth/Throat:     Mouth: Oropharynx is clear and moist.  Eyes:     Conjunctiva/sclera: Conjunctivae normal.     Pupils: Pupils are equal, round, and reactive to light.  Cardiovascular:     Rate and Rhythm: Normal rate.  Pulmonary:     Effort: Pulmonary effort is normal. No respiratory distress.  Abdominal:     General: There is no distension.     Palpations: Abdomen is soft.  Musculoskeletal:        General: No edema. Normal range of motion.     Cervical back: Normal range of motion.     Comments: Right elbow was examined.  There is tenderness directly over the lateral epicondyle.  There is pain with resistance to wrist extension.  There is pain with pronation supination.  She has trauma directly to the tendon attachment of the lateral elbow  Skin:    General: Skin is warm and dry.  Neurological:     Mental Status: She is alert and oriented to person, place, and time.  Psychiatric:        Behavior: Behavior normal.      UC Treatments / Results  Labs (all labs ordered are listed, but only abnormal results are displayed) Labs Reviewed - No data to display  EKG   Radiology DG ELBOW COMPLETE RIGHT (3+VIEW)  Result Date: 06/23/2020 CLINICAL DATA:  55 year old female with right elbow swelling. EXAM: RIGHT ELBOW - COMPLETE 3+ VIEW COMPARISON:  None. FINDINGS:  There is no evidence of fracture, dislocation, or joint effusion. There is no evidence of arthropathy or other focal bone abnormality. Soft tissues are unremarkable. IMPRESSION: Negative. Electronically Signed   By: Anner Crete M.D.   On: 06/23/2020 17:39    Procedures Procedures (including critical care time)  Medications Ordered in UC Medications - No data to display  Initial Impression / Assessment and Plan / UC Course  I have reviewed the triage vital signs and the nursing notes.  Pertinent labs & imaging results that were available during my care of the patient were reviewed by me and considered in my medical decision making (see chart for details).     Patient has trauma to her lateral elbow causing a traumatic epicondylitis.  She should limit use of her arm, use ice, take anti-inflammatories, and follow-up with Worker's Comp. Final Clinical Impressions(s) / UC Diagnoses   Final diagnoses:  Contusion of right elbow, initial encounter  Elbow pain, right     Discharge Instructions     Limit use of arm while painful Take aleve 2 tabs morning and night with food Use ice to area to reduce pain and swelling Follow up with health at work   ED Prescriptions    None     PDMP not reviewed this encounter.   Raylene Everts, MD 06/23/20 [phone]

## 2020-12-01 ENCOUNTER — Other Ambulatory Visit (HOSPITAL_COMMUNITY): Payer: Self-pay

## 2020-12-01 ENCOUNTER — Ambulatory Visit: Payer: 59 | Attending: Internal Medicine | Admitting: Internal Medicine

## 2020-12-01 ENCOUNTER — Other Ambulatory Visit: Payer: Self-pay

## 2020-12-01 ENCOUNTER — Encounter: Payer: Self-pay | Admitting: Internal Medicine

## 2020-12-01 DIAGNOSIS — Z1159 Encounter for screening for other viral diseases: Secondary | ICD-10-CM | POA: Diagnosis not present

## 2020-12-01 DIAGNOSIS — R159 Full incontinence of feces: Secondary | ICD-10-CM | POA: Diagnosis not present

## 2020-12-01 DIAGNOSIS — Z7689 Persons encountering health services in other specified circumstances: Secondary | ICD-10-CM

## 2020-12-01 DIAGNOSIS — F17200 Nicotine dependence, unspecified, uncomplicated: Secondary | ICD-10-CM | POA: Diagnosis not present

## 2020-12-01 DIAGNOSIS — R109 Unspecified abdominal pain: Secondary | ICD-10-CM | POA: Diagnosis not present

## 2020-12-01 DIAGNOSIS — R739 Hyperglycemia, unspecified: Secondary | ICD-10-CM | POA: Diagnosis not present

## 2020-12-01 DIAGNOSIS — Z114 Encounter for screening for human immunodeficiency virus [HIV]: Secondary | ICD-10-CM

## 2020-12-01 MED ORDER — NICOTINE 7 MG/24HR TD PT24
7.0000 mg | MEDICATED_PATCH | Freq: Every day | TRANSDERMAL | 2 refills | Status: DC
  Filled 2020-12-01: qty 28, 28d supply, fill #0

## 2020-12-01 NOTE — Progress Notes (Addendum)
Virtual Visit via Telephone Note  I connected with Lenard Lance on 12/01/2020 at 1:49 PM by telephone and verified that I am speaking with the correct person using two identifiers  Location: Patient: home Provider: office  Participants: Myself Patient    I discussed the limitations, risks, security and privacy concerns of performing an evaluation and management service by telephone and the availability of in person appointments. I also discussed with the patient that there may be a patient responsible charge related to this service. The patient expressed understanding and agreed to proceed.   History of Present Illness: This is a new patient visit.  Patient is a Furniture conservator/restorer.  She has not had a previous PCP.  Denies any chronic medical diagnoses and not on any chronic medications.  She smokes about a third a pack of cigarettes a day.  Smoked since 1989.  When she smoked at her heaviest she was half a pack a day.  She was able to quit for 8 months 1 time in the past.  She is ready to quit.  She had tried using nicotine gum in the past but has several missing teeth and has difficulty chewing the gum.  She would like help to quit.  She would like to have lung cancer screening.  Based on her smoking of 33 years at most half a pack a day, her pack years is 16.5 years.  Complains of fecal incontinence intermittently x2 years.  Bowel movements are regular.  Has a bowel movement once a day to every 2 days.  Noticed blood in the stools only once about 2 years ago when she was straining.  Would like to have baseline blood test done including checking her liver function.  Reports she had some right upper quadrant abdominal pain back in 2020 when she was on terbinafine for onychomycosis.  Past medical, surgical, family history and social history updated in the system.  HM: Reports having had normal Pap smear and mammogram done about a year ago through [facility].  Social History    Socioeconomic History   Marital status: Married    Spouse name: Not on file   Number of children: 0   Years of education: Not on file   Highest education level: 12th grade  Occupational History   Occupation: Food services - [employer]  Tobacco Use   Smoking status: Every Day    Packs/day: 0.25    Pack years: 0.00    Types: Cigarettes   Smokeless tobacco: Never  Vaping Use   Vaping Use: Never used  Substance and Sexual Activity   Alcohol use: Yes    Comment: occasionally   Drug use: Yes    Types: Marijuana    Comment: occasionally   Sexual activity: Yes    Birth control/protection: None  Other Topics Concern   Not on file  Social History Narrative   Not on file   Social Determinants of Health   Financial Resource Strain: Not on file  Food Insecurity: Not on file  Transportation Needs: Not on file  Physical Activity: Not on file  Stress: Not on file  Social Connections: Not on file      Observations/Objective: No direct observation done as this was a telephone encounter.  Assessment and Plan: 1. Encounter to establish care   2. Tobacco dependence Advised to quit.  Discussed health risks associated with smoking.  Patient wanting to quit.  We discussed methods to help her quit.  She is not interested in  Chantix because she is afraid of possible side effects.  She would like to try the nicotine patches.  I recommended starting on the 14 mg dose but she wants to start a lower dose.  Prescription sent to her pharmacy.  I went over with her how to use the patches.  We will reevaluate her progress on her in person visit.  3 minutes spent on counseling -Based on her pack years, she does not qualify for low-dose CT scan for lung cancer screening.  However I strongly encouraged her to quit smoking. - CBC; Future - Lipid panel; Future - Comprehensive metabolic panel; Future - nicotine (NICODERM CQ - DOSED IN MG/24 HR) 7 mg/24hr patch; Place 1 patch (7 mg total) onto the skin  daily.  Dispense: 28 patch; Refill: 2  3. Incontinence of feces, unspecified fecal incontinence type Recommend referral to gastroenterology.  She is at the age where she is due for colonoscopy.  Patient is agreeable to colonoscopy. - Ambulatory referral to Gastroenterology  4. Abdominal pain, unspecified abdominal location -This has since resolved.  However she would like to have her liver function tests checked. - Comprehensive metabolic panel; Future  5. Screening for HIV (human immunodeficiency virus) - HIV antibody (with reflex); Future  6. Need for hepatitis C screening test - Hepatitis C Antibody; Future  When she comes to have her blood test done, I recommend that she signed a release for [place] to get the results of her Pap smear and mammogram from her gynecologist's office.  Addendum 12/11/2020: Patient with elevated blood sugar on lab results.  We will add an A1c to screen for diabetes. Follow Up Instructions: 2-3 mths   I discussed the assessment and treatment plan with the patient. The patient was provided an opportunity to ask questions and all were answered. The patient agreed with the plan and demonstrated an understanding of the instructions.   The patient was advised to call back or seek an in-person evaluation if the symptoms worsen or if the condition fails to improve as anticipated.  I  Spent 25 minutes on this telephone encounter  Karle Plumber, MD

## 2020-12-10 ENCOUNTER — Ambulatory Visit: Payer: 59 | Attending: Internal Medicine

## 2020-12-10 ENCOUNTER — Other Ambulatory Visit: Payer: Self-pay

## 2020-12-10 DIAGNOSIS — F17200 Nicotine dependence, unspecified, uncomplicated: Secondary | ICD-10-CM | POA: Diagnosis not present

## 2020-12-10 DIAGNOSIS — Z114 Encounter for screening for human immunodeficiency virus [HIV]: Secondary | ICD-10-CM | POA: Diagnosis not present

## 2020-12-10 DIAGNOSIS — R109 Unspecified abdominal pain: Secondary | ICD-10-CM

## 2020-12-10 DIAGNOSIS — Z1159 Encounter for screening for other viral diseases: Secondary | ICD-10-CM

## 2020-12-10 DIAGNOSIS — R739 Hyperglycemia, unspecified: Secondary | ICD-10-CM | POA: Diagnosis not present

## 2020-12-11 LAB — COMPREHENSIVE METABOLIC PANEL WITH GFR
ALT: 17 IU/L (ref 0–32)
AST: 14 IU/L (ref 0–40)
Albumin/Globulin Ratio: 1.2 (ref 1.2–2.2)
Albumin: 4.1 g/dL (ref 3.8–4.9)
Alkaline Phosphatase: 96 IU/L (ref 44–121)
BUN/Creatinine Ratio: 19 (ref 9–23)
BUN: 12 mg/dL (ref 6–24)
Bilirubin Total: <0.2 mg/dL (ref 0.0–1.2)
CO2: 21 mmol/L (ref 20–29)
Calcium: 9.3 mg/dL (ref 8.7–10.2)
Chloride: 103 mmol/L (ref 96–106)
Creatinine, Ser: 0.62 mg/dL (ref 0.57–1.00)
Globulin, Total: 3.3 g/dL (ref 1.5–4.5)
Glucose: 148 mg/dL — ABNORMAL HIGH (ref 65–99)
Potassium: 4.8 mmol/L (ref 3.5–5.2)
Sodium: 136 mmol/L (ref 134–144)
Total Protein: 7.4 g/dL (ref 6.0–8.5)
eGFR: 107 mL/min/1.73

## 2020-12-11 LAB — LIPID PANEL
Chol/HDL Ratio: 3 ratio (ref 0.0–4.4)
Cholesterol, Total: 155 mg/dL (ref 100–199)
HDL: 52 mg/dL (ref >39)
LDL Chol Calc (NIH): 91 mg/dL (ref 0–99)
Triglycerides: 60 mg/dL (ref 0–149)
VLDL Cholesterol Cal: 12 mg/dL (ref 5–40)

## 2020-12-11 LAB — CBC
Hematocrit: 38.2 % (ref 34.0–46.6)
Hemoglobin: 12.7 g/dL (ref 11.1–15.9)
MCH: 27.9 pg (ref 26.6–33.0)
MCHC: 33.2 g/dL (ref 31.5–35.7)
MCV: 84 fL (ref 79–97)
Platelets: 258 10*3/uL (ref 150–450)
RBC: 4.56 x10E6/uL (ref 3.77–5.28)
RDW: 14.7 % (ref 11.7–15.4)
WBC: 6.1 10*3/uL (ref 3.4–10.8)

## 2020-12-11 LAB — HIV ANTIBODY (ROUTINE TESTING W REFLEX): HIV Screen 4th Generation wRfx: NONREACTIVE

## 2020-12-11 LAB — HEPATITIS C ANTIBODY: Hep C Virus Ab: <0.1 s/co ratio (ref 0.0–0.9)

## 2020-12-11 NOTE — Addendum Note (Signed)
Addended by: Karle Plumber B on: 12/11/2020 09:07 AM   Modules accepted: Orders

## 2020-12-13 LAB — HEMOGLOBIN A1C
Est. average glucose Bld gHb Est-mCnc: 126 mg/dL
Hgb A1c MFr Bld: 6 % — ABNORMAL HIGH (ref 4.8–5.6)

## 2020-12-13 LAB — SPECIMEN STATUS REPORT

## 2020-12-22 ENCOUNTER — Ambulatory Visit: Payer: Self-pay | Admitting: *Deleted

## 2020-12-22 NOTE — Telephone Encounter (Signed)
Reason for Disposition  [5] Systolic BP  >= 449 OR Diastolic >= 80 AND [2] not taking BP medications  Answer Assessment - Initial Assessment Questions 1. BLOOD PRESSURE: "What is the blood pressure?" "Did you take at least two measurements 5 minutes apart?"     160/80  2. ONSET: "When did you take your blood pressure?"     10 minutes ago  3. HOW: "How did you obtain the blood pressure?" (e.g., visiting nurse, automatic home BP monitor)     Checked at work in  hospital  4. HISTORY: "Do you have a history of high blood pressure?"     no 5. MEDICATIONS: "Are you taking any medications for blood pressure?" "Have you missed any doses recently?"     no 6. OTHER SYMPTOMS: "Do you have any symptoms?" (e.g., headache, chest pain, blurred vision, difficulty breathing, weakness)     Chest pain once or twice every two weeks. Headaches , dizziness and lightheaded  7. PREGNANCY: "Is there any chance you are pregnant?" "When was your last menstrual period?"     na  Protocols used: Blood Pressure - High-A-AH

## 2020-12-22 NOTE — Telephone Encounter (Signed)
C/o elevated B/P today. Patient c/o dizziness and lightheadedness when leaving work today . Patient got nurse to check B/P 10 minutes ago for 160/80. Patient denies symptoms at this time. C/o headaches at times in left posterior side of head lasting approx. 5 minutes. Chest pains once to twice every 2 weeks last 3-5 minutes. Sharp pains noted on left side and sometimes notes feelings of reflux especially when lying flat. Patient wanted appt. No appt noted earlier than 02/12/21. Please notify patient if earlier appt available. Care advise given. Patient verbalized understanding of care advise and to call back or go to [facility] or ED if symptoms worsen.

## 2020-12-23 NOTE — Telephone Encounter (Signed)
Will forward to provider  

## 2020-12-24 ENCOUNTER — Other Ambulatory Visit (HOSPITAL_COMMUNITY): Payer: Self-pay

## 2020-12-24 ENCOUNTER — Ambulatory Visit: Payer: 59 | Admitting: Physician Assistant

## 2020-12-24 ENCOUNTER — Encounter: Payer: Self-pay | Admitting: Physician Assistant

## 2020-12-24 ENCOUNTER — Encounter: Payer: Self-pay | Admitting: Internal Medicine

## 2020-12-24 ENCOUNTER — Other Ambulatory Visit: Payer: Self-pay

## 2020-12-24 VITALS — BP 138/78 | HR 80 | Temp 98.2°F | Resp 18 | Ht 62.0 in | Wt 181.0 lb

## 2020-12-24 DIAGNOSIS — R7303 Prediabetes: Secondary | ICD-10-CM

## 2020-12-24 DIAGNOSIS — N951 Menopausal and female climacteric states: Secondary | ICD-10-CM | POA: Diagnosis not present

## 2020-12-24 DIAGNOSIS — F17200 Nicotine dependence, unspecified, uncomplicated: Secondary | ICD-10-CM

## 2020-12-24 DIAGNOSIS — I10 Essential (primary) hypertension: Secondary | ICD-10-CM

## 2020-12-24 MED ORDER — LISINOPRIL 5 MG PO TABS
5.0000 mg | ORAL_TABLET | Freq: Every day | ORAL | 2 refills | Status: DC
  Filled 2020-12-24: qty 30, 30d supply, fill #0
  Filled 2021-01-26: qty 30, 30d supply, fill #1
  Filled 2021-02-26: qty 30, 30d supply, fill #2

## 2020-12-24 MED ORDER — METFORMIN HCL 500 MG PO TABS
500.0000 mg | ORAL_TABLET | Freq: Two times a day (BID) | ORAL | 2 refills | Status: DC
  Filled 2020-12-24: qty 60, 30d supply, fill #0
  Filled 2021-02-26: qty 30, 15d supply, fill #1

## 2020-12-24 NOTE — Progress Notes (Signed)
Patient presents with HA and dizziness beginning at the start of the year with feeling flush and sweating. Patient shares this past Monday she became lightheaded and dizzy at work which subsided after a few minutes of deep breathing. Patient had her BP checked at 3 pm that afternoon which resulted in 160/80. Patient used her sisters cuff last night and received a reading of 154/76. Patient rechecked this morning which was 138/81 at 9:15 am.

## 2020-12-24 NOTE — Progress Notes (Signed)
I received patient's record from Dr. Brien Few on her gynecologist.  Her last mammogram was done 08/10/2019 and was negative.  Last Pap smear was done 08/10/2019 and was normal with negative HPV.  Patient also had negative hepatitis C and HIV screening test and RPR on the same day.

## 2020-12-24 NOTE — Progress Notes (Signed)
Established Patient Office Visit  Subjective:  [name], female    DOB: February 21, 1966  Age: 55 y.o. MRN: [id number]  CC:  Chief Complaint  Patient presents with   Blood Pressure Check    Elevated during home readings    HPI Shelby Wyatt reports that she has been having elevated blood pressure readings.  Reports that she has been having episodes of headaches, weakness, and sweating.  States that she has been checking her blood pressure and has experienced several elevated readings, stating her blood pressure at work yesterday was 160/80 and then again last night was 154/76.  Reports that she drinks approximately 32 ounces of water a day.  Reports that she does have difficulty sleeping due to feeling hot.  Reports that she has been using a fan with some relief.  Reports that she is still having a menstrual cycle, states that she had a menstrual cycle last month, but states that it has been irregular for several years.    Past Medical History:  Diagnosis Date   No pertinent past medical history    Tubal pregnancy     Past Surgical History:  Procedure Laterality Date   laporoscopy     TUBAL LIGATION      Family History  Problem Relation Age of Onset   Hypertension Mother    Diabetes Mother    Breast cancer Mother    Hypertension Maternal Grandmother    Hypertension Paternal Grandmother    Hypertension Paternal Grandfather     Social History   Socioeconomic History   Marital status: Married    Spouse name: Not on file   Number of children: 0   Years of education: Not on file   Highest education level: 12th grade  Occupational History   Occupation: Food services - [employer]  Tobacco Use   Smoking status: Every Day    Packs/day: 0.25    Types: Cigarettes   Smokeless tobacco: Never  Vaping Use   Vaping Use: Never used  Substance and Sexual Activity   Alcohol use: Yes    Comment: occasionally   Drug use: Yes    Types: Marijuana     Comment: occasionally   Sexual activity: Yes    Birth control/protection: None  Other Topics Concern   Not on file  Social History Narrative   Not on file   Social Determinants of Health   Financial Resource Strain: Not on file  Food Insecurity: Not on file  Transportation Needs: Not on file  Physical Activity: Not on file  Stress: Not on file  Social Connections: Not on file  Intimate Partner Violence: Not on file    Outpatient Medications Prior to Visit  Medication Sig Dispense Refill   naproxen sodium (ALEVE) 220 MG tablet Take 220 mg by mouth.     nicotine (NICODERM CQ - DOSED IN MG/24 HR) 7 mg/24hr patch Place 1 patch (7 mg total) onto the skin daily. 28 patch 2   No facility-administered medications prior to visit.    Allergies  Allergen Reactions   Terbinafine And Related     ROS Review of Systems  Constitutional: Negative.   HENT: Negative.    Eyes: Negative.   Respiratory:  Negative for shortness of breath.   Cardiovascular:  Negative for chest pain.  Gastrointestinal: Negative.   Endocrine: Negative.   Genitourinary: Negative.   Musculoskeletal: Negative.   Skin: Negative.   Allergic/Immunologic: Negative.   Neurological: Negative.   Hematological: Negative.  Psychiatric/Behavioral: Negative.       Objective:    Physical Exam Vitals and nursing note reviewed.  Constitutional:      Appearance: Normal appearance.  HENT:     Head: Normocephalic and atraumatic.     Right Ear: External ear normal.     Left Ear: External ear normal.     Nose: Nose normal.     Mouth/Throat:     Mouth: Mucous membranes are moist.     Pharynx: Oropharynx is clear.  Eyes:     Extraocular Movements: Extraocular movements intact.     Conjunctiva/sclera: Conjunctivae normal.     Pupils: Pupils are equal, round, and reactive to light.  Cardiovascular:     Rate and Rhythm: Normal rate and regular rhythm.     Pulses: Normal pulses.     Heart sounds: Normal heart  sounds.  Pulmonary:     Effort: Pulmonary effort is normal.     Breath sounds: Normal breath sounds.  Musculoskeletal:        General: Normal range of motion.     Cervical back: Normal range of motion and neck supple.  Skin:    General: Skin is warm and dry.  Neurological:     General: No focal deficit present.     Mental Status: She is alert and oriented to person, place, and time.  Psychiatric:        Mood and Affect: Mood normal.        Behavior: Behavior normal.        Thought Content: Thought content normal.        Judgment: Judgment normal.    BP 138/78 (BP Location: Left Arm, Patient Position: Sitting, Cuff Size: Normal)   Pulse 80   Temp 98.2 F (36.8 C) (Temporal)   Resp 18   Ht _0  (1.575 m)   Wt 181 lb (82.1 kg)   LMP 11/27/2020 Comment: last day  SpO2 98%   BMI 33.11 kg/m  Wt Readings from Last 3 Encounters:  12/24/20 181 lb (82.1 kg)  10/20/18 165 lb (74.8 kg)     Health Maintenance Due  Topic Date Due   Pneumococcal Vaccine 1-14 Years old (1 - PCV) Never done   TETANUS/TDAP  Never done   COLONOSCOPY (Pts 45-76yr Insurance coverage will need to be confirmed)  Never done   Zoster Vaccines- Shingrix (1 of 2) Never done   COVID-19 Vaccine (3 - Booster for Pfizer series) 03/17/2020    There are no preventive care reminders to display for this patient.  No results found for: TSH Lab Results  Component Value Date   WBC 6.1 12/10/2020   HGB 12.7 12/10/2020   HCT 38.2 12/10/2020   MCV 84 12/10/2020   PLT 258 12/10/2020   Lab Results  Component Value Date   NA 136 12/10/2020   K 4.8 12/10/2020   CO2 21 12/10/2020   GLUCOSE 148 (H) 12/10/2020   BUN 12 12/10/2020   CREATININE 0.62 12/10/2020   BILITOT <0.2 12/10/2020   ALKPHOS 96 12/10/2020   AST 14 12/10/2020   ALT 17 12/10/2020   PROT 7.4 12/10/2020   ALBUMIN 4.1 12/10/2020   CALCIUM 9.3 12/10/2020   ANIONGAP 13 10/20/2018   EGFR 107 12/10/2020   Lab Results  Component Value Date    CHOL 155 12/10/2020   Lab Results  Component Value Date   HDL 52 12/10/2020   Lab Results  Component Value Date   LDLCALC 91 12/10/2020  Lab Results  Component Value Date   TRIG 60 12/10/2020   Lab Results  Component Value Date   CHOLHDL 3.0 12/10/2020   Lab Results  Component Value Date   HGBA1C 6.0 (H) 12/10/2020      Assessment & Plan:   Problem List Items Addressed This Visit       Other   Tobacco dependence   Other Visit Diagnoses     Essential hypertension    -  Primary   Relevant Medications   lisinopril (ZESTRIL) 5 MG tablet   Prediabetes       Relevant Medications   metFORMIN (GLUCOPHAGE) 500 MG tablet   Other Relevant Orders   Referral to Nutrition and Diabetes Services   Perimenopausal           Meds ordered this encounter  Medications   lisinopril (ZESTRIL) 5 MG tablet    Sig: Take 1 tablet (5 mg total) by mouth daily.    Dispense:  30 tablet    Refill:  2    Order Specific Question:   Supervising Provider    Answer:   Elsie Stain [[id number]]   metFORMIN (GLUCOPHAGE) 500 MG tablet    Sig: Take 1 tablet (500 mg total) by mouth 2 (two) times daily with a meal.    Dispense:  30 tablet    Refill:  2    Order Specific Question:   Supervising Provider    Answer:   Joya Gaskins, PATRICK E [[id number]]  1. Essential hypertension Trial lisinopril.  Patient encouraged to check blood pressure at home, keep a written log and have available for all office visits.  Patient encouraged to increase hydration, follow a low sugar, low-sodium diet.  Red flags given for prompt reevaluation. - lisinopril (ZESTRIL) 5 MG tablet; Take 1 tablet (5 mg total) by mouth daily.  Dispense: 30 tablet; Refill: 2  2. Prediabetes Patient recently had A1c of 6.0.  Patient does want to start trial of metformin.  Refer for nutrition education.  Patient education given on mindful eating, Mediterranean style diet. - metFORMIN (GLUCOPHAGE) 500 MG tablet; Take 1 tablet (500 mg total) by  mouth 2 (two) times daily with a meal.  Dispense: 30 tablet; Refill: 2 - Referral to Nutrition and Diabetes Services  3. Tobacco dependence   4. Perimenopausal Patient encouraged to improve sleep hygiene, increase hydration, low sugar diet.   I have reviewed the patient's medical history (PMH, PSH, Social History, Family History, Medications, and allergies) , and have been updated if relevant. I spent 34 minutes reviewing chart and  face to face time with patient.    Follow-up: Return if symptoms worsen or fail to improve.    Loraine Grip Mayers, PA-C

## 2020-12-24 NOTE — Patient Instructions (Addendum)
For your elevated blood pressure, you will start lisinopril 5 mg once a day.  I encourage you to check your blood pressure on a daily basis, keep a written log and have available for all office visits.  For your prediabetes, you will start metformin 500 mg once a day.  I encourage you to follow a low sugar diet, practice mindfulness.  I do encourage you to increase your water intake to at least 64 ounces of water a day.  I have started a referral for you to be seen by a nutritionist.  Please feel free to follow-up with the mobile medicine unit if necessary prior to your next office visit with Dr. Wynetta Emery.  Kennieth Rad, PA-C Physician Assistant [place] [email or web address]  How to Take Your Blood Pressure Blood pressure is a measurement of how strongly your blood is pressing against the walls of your arteries. Arteries are blood vessels that carry blood from your heart throughout your body. Your health care provider takes your blood pressure at each office visit. You can also take your own blood pressure athome with a blood pressure monitor. You may need to take your own blood pressure to: Confirm a diagnosis of high blood pressure (hypertension). Monitor your blood pressure over time. Make sure your blood pressure medicine is working. Supplies needed: Blood pressure monitor. Dining room chair to sit in. Table or desk. Small notebook and pencil or pen. How to prepare To get the most accurate reading, avoid the following for 30 minutes before you check your blood pressure: Drinking caffeine. Drinking alcohol. Eating. Smoking. Exercising. Five minutes before you check your blood pressure: Use the bathroom and urinate so that you have an empty bladder. Sit quietly in a dining room chair. Do not sit in a soft couch or an armchair. Do not talk. How to take your blood pressure To check your blood pressure, follow the  instructions in the manual that came with your blood pressure monitor. If you have a digital blood pressure monitor, the instructions may be as follows: Sit up straight in a chair. Place your feet on the floor. Do not cross your ankles or legs. Rest your left arm at the level of your heart on a table or desk or on the arm of a chair. Pull up your shirt sleeve. Wrap the blood pressure cuff around the upper part of your left arm, 1 inch (2.5 cm) above your elbow. It is best to wrap the cuff around bare skin. Fit the cuff snugly around your arm. You should be able to place only one finger between the cuff and your arm. Position the cord so that it rests in the bend of your elbow. Press the power button. Sit quietly while the cuff inflates and deflates. Read the digital reading on the monitor screen and write the numbers down (record them) in a notebook. Wait 2-3 minutes, then repeat the steps, starting at step 1. What does my blood pressure reading mean? A blood pressure reading consists of a higher number over a lower number. Ideally, your blood pressure should be below 120/80. The first ("top") number is called the systolic pressure. It is a measure of the pressure in your arteries as your heart beats. The second ("bottom") number is called the diastolic pressure. It is a measure of the pressure in your arteries as theheart relaxes. Blood pressure is classified into five stages. The following are the stages for adults who do not have a short-term serious illness  or a chronic condition. Systolic pressure and diastolic pressure are measured in a unit called mm Hg (millimeters of mercury). Normal Systolic pressure: below 932. Diastolic pressure: below 80. Elevated Systolic pressure: 671-245. Diastolic pressure: below 80. Hypertension stage 1 Systolic pressure: 809-983. Diastolic pressure: 38-25. Hypertension stage 2 Systolic pressure: 053 or above. Diastolic pressure: 90 or above. You can have  elevated blood pressure or hypertension even if only the systolicor only the diastolic number in your reading is higher than normal. Follow these instructions at home: Medicines Take over-the-counter and prescription medicines only as told by your health care provider. Tell your health care provider if you are having any side effects from blood pressure medicine. General instructions Check your blood pressure as often as recommended by your health care provider. Check your blood pressure at the same time every day. Take your monitor to the next appointment with your health care provider to make sure that: You are using it correctly. It provides accurate readings. Understand what your goal blood pressure numbers are. Keep all follow-up visits as told by your health care provider. This is important. General tips Your health care provider can suggest a reliable monitor that will meet your needs. There are several types of home blood pressure monitors. Choose a monitor that has an arm cuff. Do not choose a monitor that measures your blood pressure from your wrist or finger. Choose a cuff that wraps snugly around your upper arm. You should be able to fit only one finger between your arm and the cuff. You can buy a blood pressure monitor at most drugstores or online. Where to find more information American Heart Association: www.heart.org Contact a health care provider if: Your blood pressure is consistently high. Your blood pressure is suddenly low. Get help right away if: Your systolic blood pressure is higher than 180. Your diastolic blood pressure is higher than 120. Summary Blood pressure is a measurement of how strongly your blood is pressing against the walls of your arteries. A blood pressure reading consists of a higher number over a lower number. Ideally, your blood pressure should be below 120/80. Check your blood pressure at the same time every day. Avoid caffeine, alcohol,  smoking, and exercise for 30 minutes prior to checking your blood pressure. These agents can affect the accuracy of the blood pressure reading. This information is not intended to replace advice given to you by your health care provider. Make sure you discuss any questions you have with your healthcare provider. Document Revised: 04/09/2020 Document Reviewed: 05/25/2019 Elsevier Patient Education  2022 [place] refers to food and lifestyle choices that are based on the traditions of countries located on the [employer]. This way of eating has been shown to help prevent certain conditions and improve outcomes forpeople who have chronic diseases, like kidney disease and heart disease. What are tips for following this plan? Lifestyle Cook and eat meals together with your family, when possible. Drink enough fluid to keep your urine clear or pale yellow. Be physically active every day. This includes: Aerobic exercise like running or swimming. Leisure activities like gardening, walking, or housework. Get 7-8 hours of sleep each night. If recommended by your health care provider, drink red wine in moderation. This means 1 glass a day for nonpregnant women and 2 glasses a day for men. A glass of wine equals 5 oz (150 mL). Reading food labels  Check the serving size of packaged foods. For foods such  as rice and pasta, the serving size refers to the amount of cooked product, not dry. Check the total fat in packaged foods. Avoid foods that have saturated fat or trans fats. Check the ingredients list for added sugars, such as corn syrup.  Shopping At the grocery store, buy most of your food from the areas near the walls of the store. This includes: Fresh fruits and vegetables (produce). Grains, beans, nuts, and seeds. Some of these may be available in unpackaged forms or large amounts (in bulk). Fresh seafood. Poultry and eggs. Low-fat dairy  products. Buy whole ingredients instead of prepackaged foods. Buy fresh fruits and vegetables in-season from local farmers markets. Buy frozen fruits and vegetables in resealable bags. If you do not have access to quality fresh seafood, buy precooked frozen shrimp or canned fish, such as tuna, salmon, or sardines. Buy small amounts of raw or cooked vegetables, salads, or olives from the deli or salad bar at your store. Stock your pantry so you always have certain foods on hand, such as olive oil, canned tuna, canned tomatoes, rice, pasta, and beans. Cooking Cook foods with extra-virgin olive oil instead of using butter or other vegetable oils. Have meat as a side dish, and have vegetables or grains as your main dish. This means having meat in small portions or adding small amounts of meat to foods like pasta or stew. Use beans or vegetables instead of meat in common dishes like chili or lasagna. Experiment with different cooking methods. Try roasting or broiling vegetables instead of steaming or sauteing them. Add frozen vegetables to soups, stews, pasta, or rice. Add nuts or seeds for added healthy fat at each meal. You can add these to yogurt, salads, or vegetable dishes. Marinate fish or vegetables using olive oil, lemon juice, garlic, and fresh herbs. Meal planning  Plan to eat 1 vegetarian meal one day each week. Try to work up to 2 vegetarian meals, if possible. Eat seafood 2 or more times a week. Have healthy snacks readily available, such as: Vegetable sticks with hummus. Greek yogurt. Fruit and nut trail mix. Eat balanced meals throughout the week. This includes: Fruit: 2-3 servings a day Vegetables: 4-5 servings a day Low-fat dairy: 2 servings a day Fish, poultry, or lean meat: 1 serving a day Beans and legumes: 2 or more servings a week Nuts and seeds: 1-2 servings a day Whole grains: 6-8 servings a day Extra-virgin olive oil: 3-4 servings a day Limit red meat and sweets  to only a few servings a month  What are my food choices? Mediterranean diet Recommended Grains: Whole-grain pasta. Brown rice. Bulgar wheat. Polenta. Couscous. Whole-wheat bread. [place]. Vegetables: Artichokes. Beets. Broccoli. Cabbage. Carrots. Eggplant. Green beans. Chard. Kale. Spinach. Onions. Leeks. Peas. Squash. Tomatoes. Peppers. Radishes. Fruits: Apples. Apricots. Avocado. Berries. Bananas. Cherries. Dates. Figs. Grapes. Lemons. Melon. Oranges. Peaches. Plums. Pomegranate. Meats and other protein foods: Beans. Almonds. Sunflower seeds. Pine nuts. Peanuts. [place]. Salmon. Scallops. Shrimp. [place]. Tilapia. Clams. Oysters. Eggs. Dairy: Low-fat milk. Cheese. Greek yogurt. Beverages: Water. Red wine. Herbal tea. Fats and oils: Extra virgin olive oil. Avocado oil. Grape seed oil. Sweets and desserts: [place] yogurt with honey. Baked apples. Poached pears. Trail mix. Seasoning and other foods: Basil. Cilantro. Coriander. Cumin. Mint. Parsley. Sage. Rosemary. Tarragon. Garlic. Oregano. Thyme. Pepper. Balsalmic vinegar. Tahini. Hummus. Tomato sauce. Olives. Mushrooms. Limit these Grains: Prepackaged pasta or rice dishes. Prepackaged cereal with added sugar. Vegetables: Deep fried potatoes (french fries). Fruits: Fruit canned in syrup. Meats  and other protein foods: Beef. Pork. Lamb. Poultry with skin. Hot dogs. Berniece Salines. Dairy: Ice cream. Sour cream. Whole milk. Beverages: Juice. Sugar-sweetened soft drinks. Beer. Liquor and spirits. Fats and oils: Butter. Canola oil. Vegetable oil. Beef fat (tallow). Lard. Sweets and desserts: Cookies. Cakes. Pies. Candy. Seasoning and other foods: Mayonnaise. Premade sauces and marinades. The items listed may not be a complete list. Talk with your dietitian aboutwhat dietary choices are right for you. Summary The Mediterranean diet includes both food and lifestyle choices. Eat a variety of fresh fruits and vegetables, beans, nuts, seeds, and whole  grains. Limit the amount of red meat and sweets that you eat. Talk with your health care provider about whether it is safe for you to drink red wine in moderation. This means 1 glass a day for nonpregnant women and 2 glasses a day for men. A glass of wine equals 5 oz (150 mL). This information is not intended to replace advice given to you by your health care provider. Make sure you discuss any questions you have with your healthcare provider. Document Revised: 01/29/2016 Document Reviewed: 01/22/2016 Elsevier Patient Education  [place].

## 2020-12-24 NOTE — Telephone Encounter (Signed)
All providers are fully booked. Provided patient with [facility] phone number to call to get on Cari schedule for today if possible.

## 2020-12-25 DIAGNOSIS — N951 Menopausal and female climacteric states: Secondary | ICD-10-CM | POA: Insufficient documentation

## 2020-12-25 DIAGNOSIS — R7303 Prediabetes: Secondary | ICD-10-CM | POA: Insufficient documentation

## 2020-12-25 DIAGNOSIS — I10 Essential (primary) hypertension: Secondary | ICD-10-CM | POA: Insufficient documentation

## 2021-01-05 ENCOUNTER — Encounter: Payer: Self-pay | Admitting: Dietician

## 2021-01-05 ENCOUNTER — Encounter: Payer: 59 | Attending: Internal Medicine | Admitting: Dietician

## 2021-01-05 ENCOUNTER — Other Ambulatory Visit: Payer: Self-pay

## 2021-01-05 DIAGNOSIS — R7303 Prediabetes: Secondary | ICD-10-CM | POA: Insufficient documentation

## 2021-01-05 NOTE — Progress Notes (Signed)
Medical Nutrition Therapy  Appointment Start time:  83  Appointment End time:  1730  Primary concerns today: Prediabetes  Referral diagnosis: R73.03 - Prediabetes Preferred learning style: No preference indicated Learning readiness: Ready   NUTRITION ASSESSMENT   Anthropometrics  Ht: 5'2" Wt: 181 lbs  Clinical Medical Hx: HTN, Prediabetes, Tobacco use Medications: Lisinopril, metformin (not taking due to concern of side effects n/v/d) Labs: A1c 6.0 Notable Signs/Symptoms: N/A  Lifestyle & Dietary Hx  Pt works in [employer], 6:00 am - 3:00 pm with every other weekend off. Pt is on their feet and active while at work. Pt has been a smoker since 1989, planning on quitting on quitting smoking January 12, 2021 with the help of Nicoderm. Pt reports liking to eat, married to a Biomedical scientist. Pt states they have always eaten whatever they want and never gained any weight. Pt reports usually skipping lunch when at home, may snack a little.  Pt currently drinks about 32 oz of water each day, is working on a goal of 64 oz per day. Pt drinks V8 splash, occasional Coke and sweet tea. Pt has a dog they walk every morning for 10-15 minutes before going to work. Pt reports left knee pain, has been through physical therapy but has pain occasionally. Pt reports major stressor of difficulty in communications with spouse, states they usually just walk away and move on. Pt is motivated to take care of themselves and improve blood sugar control   Estimated daily fluid intake: 50-60 oz Supplements: Centrum silver women's One-A-Day Sleep: Not great, has difficulty falling asleep occasionally Stress / self-care: Marital conflicts Current average weekly physical activity: ADLs, walks dog, dances  24-Hr Dietary Recall First Meal: [employer]'s pancakes w/syrup, scrambled eggs, bacon, hashbrown, coffee Snack: none Second Meal: none Snack: none Third Meal: Cube steak, rice and gravy, green  beans, 2 pieces of white wheat bread, V8 splash Snack: none Beverages: coffee, water, V8 splash   NUTRITION DIAGNOSIS  NB-1.1 Food and nutrition-related knowledge deficit As related to prediabetes.  As evidenced by A1c of 6.0, skipping lunch, and self-reported over consumption at meals.   NUTRITION INTERVENTION  Nutrition education (E-1) on the following topics:  Educated patient on the pathophysiology of diabetes. This includes why our bodies need circulating blood sugar, the relationship between insulin and blood sugar, and the results of insulin resistance and/or pancreatic insufficiency on the development of diabetes. Educated patient on factors that contribute to elevation of blood sugars, such as stress, illness, injury,and food choices. Discussed the role that physical activity plays in lowering blood sugar. Educate patient on the three main macronutrients. Protein, fats, and carbohydrates. Discussed how each of these macronutrients affect blood sugar levels, especially carbohydrate, and the importance of eating a consistent amount of carbohydrate throughout the day.   Handouts Provided Include  Balanced Plate Balanced Plate Food List  Learning Style & Readiness for Change Teaching method utilized: Visual & Auditory  Demonstrated degree of understanding via: Teach Back  Barriers to learning/adherence to lifestyle change: None  Goals Established by Pt Begin taking your metformin as prescribed. Always take it after a meal on a full stomach. Take your pill after breakfast and dinner. Begin increasing your physical activity up to 150 minutes a week.  Try some "Hiram Comber" or adding some intensity to your walks with your dog. When having fruits, a serving size is about the size of a baseball ball. Remember 1/2 a banana is a serving size.  Balance your smoothies with a good source of protein. Work towards eating three meals a day, about 5-6 hours apart! Begin to recognize carbohydrates in  your food choices!  Begin to build your meals using the proportions of the Balanced Plate. First, select your carb choice(s) for the meal Next, select your source of protein to pair with your carb choice(s). Finally, complete the remaining half of your meal with a variety of non-starchy vegetables. Purchase divided plates to begin building balanced meals!   MONITORING & EVALUATION Dietary intake, weekly physical activity, and meal frequency in 2 months.  Next Steps  Patient is to follow up with RD.

## 2021-01-05 NOTE — Patient Instructions (Addendum)
Begin taking your metformin as prescribed. Always take it after a meal on a full stomach. Take your pill after breakfast and dinner.  Begin increasing your physical activity up to 150 minutes a week.  Try some "Hiram Comber" or adding some intensity to your walks with your dog.  When having fruits, a serving size is about the size of a baseball ball. Remember 1/2 a banana is a serving size.  Balance your smoothies with a good source of protein.  Work towards eating three meals a day, about 5-6 hours apart!  Begin to recognize carbohydrates in your food choices!   Begin to build your meals using the proportions of the Balanced Plate. First, select your carb choice(s) for the meal Next, select your source of protein to pair with your carb choice(s). Finally, complete the remaining half of your meal with a variety of non-starchy vegetables.  Purchase divided plates to begin building balanced meals!

## 2021-01-13 ENCOUNTER — Ambulatory Visit: Payer: 59 | Admitting: Gastroenterology

## 2021-01-13 ENCOUNTER — Encounter: Payer: Self-pay | Admitting: Gastroenterology

## 2021-01-13 ENCOUNTER — Other Ambulatory Visit (HOSPITAL_COMMUNITY): Payer: Self-pay

## 2021-01-13 VITALS — BP 122/76 | HR 76 | Ht 62.0 in | Wt 186.0 lb

## 2021-01-13 DIAGNOSIS — Z1212 Encounter for screening for malignant neoplasm of rectum: Secondary | ICD-10-CM

## 2021-01-13 DIAGNOSIS — R151 Fecal smearing: Secondary | ICD-10-CM

## 2021-01-13 DIAGNOSIS — Z1211 Encounter for screening for malignant neoplasm of colon: Secondary | ICD-10-CM | POA: Diagnosis not present

## 2021-01-13 MED ORDER — PLENVU 140 G PO SOLR
ORAL | 0 refills | Status: DC
  Filled 2021-01-13: qty 3, 1d supply, fill #0

## 2021-01-13 NOTE — Patient Instructions (Signed)
If you are age 53 or older, your body mass index should be between 23-30. Your Body mass index is 34.02 kg/m. If this is out of the aforementioned range listed, please consider follow up with your Primary Care Provider.  If you are age 61 or younger, your body mass index should be between 19-25. Your Body mass index is 34.02 kg/m. If this is out of the aformentioned range listed, please consider follow up with your Primary Care Provider.   You have been scheduled for a colonoscopy. Please follow written instructions given to you at your visit today.  Please pick up your prep supplies at the pharmacy within the next 1-3 days. If you use inhalers (even only as needed), please bring them with you on the day of your procedure.  Start Metamucil daily.   The [facility] providers would like to encourage you to use [facility] to communicate with providers for non-urgent requests or questions.  Due to long hold times on the telephone, sending your provider a message by [facility] may be a faster and more efficient way to get a response.  Please allow 48 business hours for a response.  Please remember that this is for non-urgent requests.   It was a pleasure to see you today!  Thank you for trusting me with your gastrointestinal care!    Scott E. Candis Schatz, MD

## 2021-01-13 NOTE — Progress Notes (Signed)
HPI : Shelby Wyatt is a very pleasant 55 year old female referred by Karle Plumber, MD for further evaluation of fecal incontinence and for initial screening colonoscopy.  The patient states she has been having symptoms of incontinence for 2 years now.  Her incontinence is characterized by small amounts of fecal material seeping out following bowel movements.  This is been occurring with nearly every bowel movement for the past 2 years now.  Her incontinence is usually experienced within 20 minutes after a bowel movement.  The patient does not have the urge to defecate and does not realize stool is leaking out, rather she just feels the presence of stool after it happens.  Typically, the incontinence is small-volume and only liquid stool.  She typically has 1-2 bowel movements per day.  She denies problems with frequent diarrhea or constipation. Most the time her stool is formed with a bowel movement, but sometimes she has loose stools.  She denies blood in her stool.  She has problems with perianal itching associated with the seepage, but denies symptoms of itching or burning otherwise she denies pain with passage of stool or pain with wiping.  She denies feeling any protuberances around the skin or protruding from the anal canal.  She has symptoms of urinary urge incontinence as well.  She denies abdominal pain or unintentional weight loss. No family history of colon cancer.  She has never had a colonoscopy.      Past Medical History:  Diagnosis Date   No pertinent past medical history    Tubal pregnancy      Past Surgical History:  Procedure Laterality Date   laporoscopy     TUBAL LIGATION     Family History  Problem Relation Age of Onset   Hypertension Mother    Diabetes Mother    Breast cancer Mother    Hypertension Maternal Grandmother    Hypertension Paternal Grandmother    Hypertension Paternal Grandfather    Social History   Tobacco Use   Smoking status: Every Day     Packs/day: 0.25    Types: Cigarettes   Smokeless tobacco: Never  Vaping Use   Vaping Use: Never used  Substance Use Topics   Alcohol use: Yes    Comment: occasionally   Drug use: Yes    Types: Marijuana    Comment: occasionally   Current Outpatient Medications  Medication Sig Dispense Refill   lisinopril (ZESTRIL) 5 MG tablet Take 1 tablet (5 mg total) by mouth daily. 30 tablet 2   metFORMIN (GLUCOPHAGE) 500 MG tablet Take 1 tablet (500 mg total) by mouth 2 (two) times daily with a meal. 30 tablet 2   naproxen sodium (ALEVE) 220 MG tablet Take 220 mg by mouth.     nicotine (NICODERM CQ - DOSED IN MG/24 HR) 7 mg/24hr patch Place 1 patch (7 mg total) onto the skin daily. 28 patch 2   No current facility-administered medications for this visit.   Allergies  Allergen Reactions   Terbinafine And Related      Review of Systems: All systems reviewed and negative except where noted in HPI.    No results found.  Physical Exam: BP 122/76   Pulse 76   Ht '5\' 2"'$  (1.575 m)   Wt 186 lb (84.4 kg)   LMP 11/19/2020   BMI 34.02 kg/m  Constitutional: Pleasant,well-developed, African-American female in no acute distress. HEENT: Normocephalic and atraumatic. Conjunctivae are normal. No scleral icterus. Cardiovascular: Normal rate, regular rhythm.  Pulmonary/chest:  Effort normal and breath sounds normal. No wheezing, rales or rhonchi. Abdominal: Soft, nondistended, nontender. Bowel sounds active throughout. There are no masses palpable. No hepatomegaly. Extremities: no edema Rectal: Deferred until the time of colonoscopy Neurological: Alert and oriented to person place and time. Skin: Skin is warm and dry. No rashes noted. Psychiatric: Normal mood and affect. Behavior is normal.  CBC    Component Value Date/Time   WBC 6.1 12/10/2020 0958   WBC 7.6 10/20/2018 2303   RBC 4.56 12/10/2020 0958   RBC 5.02 10/20/2018 2303   HGB 12.7 12/10/2020 0958   HCT 38.2 12/10/2020 0958   PLT 258  12/10/2020 0958   MCV 84 12/10/2020 0958   MCH 27.9 12/10/2020 0958   MCH 21.1 (L) 10/20/2018 2303   MCHC 33.2 12/10/2020 0958   MCHC 29.9 (L) 10/20/2018 2303   RDW 14.7 12/10/2020 0958    CMP     Component Value Date/Time   NA 136 12/10/2020 0958   K 4.8 12/10/2020 0958   CL 103 12/10/2020 0958   CO2 21 12/10/2020 0958   GLUCOSE 148 (H) 12/10/2020 0958   GLUCOSE 154 (H) 10/20/2018 2303   BUN 12 12/10/2020 0958   CREATININE 0.62 12/10/2020 0958   CALCIUM 9.3 12/10/2020 0958   PROT 7.4 12/10/2020 0958   ALBUMIN 4.1 12/10/2020 0958   AST 14 12/10/2020 0958   ALT 17 12/10/2020 0958   ALKPHOS 96 12/10/2020 0958   BILITOT <0.2 12/10/2020 0958   GFRNONAA >60 10/20/2018 2303   GFRAA >60 10/20/2018 2303     ASSESSMENT AND PLAN: 55 year old female with 2-year history of fecal seepage following bowel movements, with otherwise normal bowel habits and no other concerning symptoms.  I suspect that her fecal seepage is most likely related to age-related decline in sphincter integrity and suboptimal stool consistency.  Internal hemorrhoids could also be a factor.  I recommended she start taking a fiber supplement daily to improve her stool bulk and consistency.  We discussed the role of pelvic floor physical therapy for this problem, and we elected to defer referring until after trial of fiber therapy. She is due for her initial screening colonoscopy.  At that time, we will perform an exam of the anus and the anal canal.  If large internal hemorrhoids are seen, then hemorrhoid banding may also be able to help with her seepage.  Fecal incontinence/seepage - Metamucil daily, increase as needed to optimize stool consistency and reduce seepage - Pelvic floor physical therapy +/- anorectal manometry if no improvement after trial of Metamucil - Hemorrhoid banding if large internal hemorrhoids seen on colonoscopy  Colon cancer screening - Plan for routine screening colonoscopy  The details,  risks (including bleeding, perforation, infection, missed lesions, medication reactions and possible hospitalization or surgery if complications occur), benefits, and alternatives to colonoscopy with possible biopsy and possible polypectomy were discussed with the patient and he consents to proceed.   I spent a total of 45 minutes reviewing the patient's medical record, interviewing and examining the patient, discussing her diagnosis and management of her condition going forward, and documenting in the medical record  Elijio Staples E. Candis Schatz, MD [facility]

## 2021-01-15 ENCOUNTER — Other Ambulatory Visit (HOSPITAL_COMMUNITY): Payer: Self-pay

## 2021-01-26 ENCOUNTER — Other Ambulatory Visit (HOSPITAL_COMMUNITY): Payer: Self-pay

## 2021-01-28 ENCOUNTER — Ambulatory Visit: Payer: Self-pay | Admitting: *Deleted

## 2021-01-28 NOTE — Telephone Encounter (Signed)
Patient would like to be contacted regarding their blood pressure   The patient shares that for the past three days their blood pressure has been elevated (reaching as high as 161/91 on 01/26/21)   The patient shares that they've also previously experienced lightheadedness as well as nausea   Called patient to review symptoms. C/o elevated B/P noted 01/26/21 161/98. C/o x 3 days dizziness, lightheaded and yesterday nausea. C/o feeling like room spinning when laying down in bed and takes approx 1 minute to go away. C/o slight headache at times . No specific symptoms at this time. Unable to check B/P at this time due to being at work. Denies chest pain, difficulty breathing, blurred vision or weakness or numbness on either side of body. Patient has scheduled appt 03/05/21. Please advise. Care advise given. Patient verbalized understanding of care advise and to call back or go to [facility] or ED if symptoms worsen.

## 2021-01-28 NOTE — Telephone Encounter (Signed)
Reason for Disposition  [id number] Systolic BP  >= [id number] OR Diastolic >= 80 AND [id number] taking BP medications  Answer Assessment - Initial Assessment Questions 1. BLOOD PRESSURE: "What is the blood pressure?" "Did you take at least two measurements 5 minutes apart?"     Patient was at work and unable to check B/P 2. ONSET: "When did you take your blood pressure?"     01/26/21 B/P 161/98 3. HOW: "How did you obtain the blood pressure?" (e.g., visiting nurse, automatic home BP monitor)     Automatic B/P monitor  4. HISTORY: "Do you have a history of high blood pressure?"     Yes 5. MEDICATIONS: "Are you taking any medications for blood pressure?" "Have you missed any doses recently?"     Taking lisinopril 6. OTHER SYMPTOMS: "Do you have any symptoms?" (e.g., headache, chest pain, blurred vision, difficulty breathing, weakness)     Slight headache, dizziness, lightheaded, nausea yesterday. Room spinning when laying down 7. PREGNANCY: "Is there any chance you are pregnant?" "When was your last menstrual period?"     na  Protocols used: Blood Pressure - High-A-AH

## 2021-01-29 ENCOUNTER — Other Ambulatory Visit (HOSPITAL_COMMUNITY): Payer: Self-pay

## 2021-01-29 MED ORDER — AMLODIPINE BESYLATE 5 MG PO TABS
5.0000 mg | ORAL_TABLET | Freq: Every day | ORAL | 2 refills | Status: DC
  Filled 2021-01-29: qty 30, 30d supply, fill #0
  Filled 2021-02-26: qty 60, 60d supply, fill #1

## 2021-01-29 NOTE — Addendum Note (Signed)
Addended by: Karle Plumber B on: 01/29/2021 01:01 PM   Modules accepted: Orders

## 2021-01-29 NOTE — Telephone Encounter (Signed)
Returned pt call and went over provider response. Pt states she will pick up medication tomorrow after work. Made pt aware that provider doesn't have any availability and provided the mobile location. Pt states she hasn't had any symptoms today. Made pt aware that if she starts to have any symptoms to be seen at the urgent care. Pt states she understands and doesn't have any questions or concerns

## 2021-01-29 NOTE — Telephone Encounter (Signed)
Will forward to provider  

## 2021-02-26 ENCOUNTER — Other Ambulatory Visit (HOSPITAL_COMMUNITY): Payer: Self-pay

## 2021-03-03 ENCOUNTER — Ambulatory Visit (AMBULATORY_SURGERY_CENTER): Payer: 59 | Admitting: Gastroenterology

## 2021-03-03 ENCOUNTER — Other Ambulatory Visit: Payer: Self-pay

## 2021-03-03 ENCOUNTER — Encounter: Payer: Self-pay | Admitting: Gastroenterology

## 2021-03-03 VITALS — BP 116/64 | HR 71 | Temp 97.5°F | Resp 16 | Ht 62.0 in | Wt 186.0 lb

## 2021-03-03 DIAGNOSIS — D125 Benign neoplasm of sigmoid colon: Secondary | ICD-10-CM | POA: Diagnosis not present

## 2021-03-03 DIAGNOSIS — D122 Benign neoplasm of ascending colon: Secondary | ICD-10-CM

## 2021-03-03 DIAGNOSIS — K635 Polyp of colon: Secondary | ICD-10-CM | POA: Diagnosis not present

## 2021-03-03 DIAGNOSIS — Z1212 Encounter for screening for malignant neoplasm of rectum: Secondary | ICD-10-CM | POA: Diagnosis not present

## 2021-03-03 DIAGNOSIS — Z1211 Encounter for screening for malignant neoplasm of colon: Secondary | ICD-10-CM | POA: Diagnosis not present

## 2021-03-03 DIAGNOSIS — R151 Fecal smearing: Secondary | ICD-10-CM | POA: Diagnosis not present

## 2021-03-03 DIAGNOSIS — D123 Benign neoplasm of transverse colon: Secondary | ICD-10-CM | POA: Diagnosis not present

## 2021-03-03 MED ORDER — SODIUM CHLORIDE 0.9 % IV SOLN: 500.0000 mL | Freq: Once | INTRAVENOUS | Status: DC

## 2021-03-03 NOTE — Patient Instructions (Signed)
Please read handouts provided. Continue present medications. Await pathology results.   YOU HAD AN ENDOSCOPIC PROCEDURE TODAY AT THE [facility]:   Refer to the procedure report that was given to you for any specific questions about what was found during the examination.  If the procedure report does not answer your questions, please call your gastroenterologist to clarify.  If you requested that your care partner not be given the details of your procedure findings, then the procedure report has been included in a sealed envelope for you to review at your convenience later.  YOU SHOULD EXPECT: Some feelings of bloating in the abdomen. Passage of more gas than usual.  Walking can help get rid of the air that was put into your GI tract during the procedure and reduce the bloating. If you had a lower endoscopy (such as a colonoscopy or flexible sigmoidoscopy) you may notice spotting of blood in your stool or on the toilet paper. If you underwent a bowel prep for your procedure, you may not have a normal bowel movement for a few days.  Please Note:  You might notice some irritation and congestion in your nose or some drainage.  This is from the oxygen used during your procedure.  There is no need for concern and it should clear up in a day or so.  SYMPTOMS TO REPORT IMMEDIATELY:  Following lower endoscopy (colonoscopy or flexible sigmoidoscopy):  Excessive amounts of blood in the stool  Significant tenderness or worsening of abdominal pains  Swelling of the abdomen that is new, acute  Fever of 100F or higher   For urgent or emergent issues, a gastroenterologist can be reached at any hour by calling [phone]. Do not use MyChart messaging for urgent concerns.    DIET:  We do recommend a small meal at first, but then you may proceed to your regular diet.  Drink plenty of fluids but you should avoid alcoholic beverages for 24 hours.  ACTIVITY:  You should plan to take it easy  for the rest of today and you should NOT DRIVE or use heavy machinery until tomorrow (because of the sedation medicines used during the test).    FOLLOW UP: Our staff will call the number listed on your records 48-72 hours following your procedure to check on you and address any questions or concerns that you may have regarding the information given to you following your procedure. If we do not reach you, we will leave a message.  We will attempt to reach you two times.  During this call, we will ask if you have developed any symptoms of COVID 19. If you develop any symptoms (ie: fever, flu-like symptoms, shortness of breath, cough etc.) before then, please call [phone].  If you test positive for Covid 19 in the 2 weeks post procedure, please call and report this information to us.    If any biopsies were taken you will be contacted by phone or by letter within the next 1-3 weeks.  Please call us at [phone] if you have not heard about the biopsies in 3 weeks.    SIGNATURES/CONFIDENTIALITY: You and/or your care partner have signed paperwork which will be entered into your electronic medical record.  These signatures attest to the fact that that the information above on your After Visit Summary has been reviewed and is understood.  Full responsibility of the confidentiality of this discharge information lies with you and/or your care-partner.  

## 2021-03-03 NOTE — Progress Notes (Signed)
[facility] History and Physical   Primary Care Physician:  Ladell Pier, MD   Reason for Procedure:   Colon cancer screening  Plan:    Screening colonoscopy     HPI: Shelby Wyatt is a 55 y.o. female here for initial average risk screening colonoscopy.  She has a history of fecal seepage, but otherwise no chronic GI symptoms. Her symptoms have improved since starting the metamucil following our visit.  No family history of colon cancer.   Past Medical History:  Diagnosis Date   Allergy    Hypertension    No pertinent past medical history    Tubal pregnancy     Past Surgical History:  Procedure Laterality Date   laporoscopy     TUBAL LIGATION      Prior to Admission medications   Medication Sig Start Date End Date Taking? Authorizing Provider  amLODipine (NORVASC) 5 MG tablet Take 1 tablet (5 mg total) by mouth daily. 01/29/21  Yes Ladell Pier, MD  lisinopril (ZESTRIL) 5 MG tablet Take 1 tablet (5 mg total) by mouth daily. 12/24/20  Yes Mayers, Cari S, PA-C  metFORMIN (GLUCOPHAGE) 500 MG tablet Take 1 tablet (500 mg total) by mouth 2 (two) times daily with a meal. 12/24/20  Yes Mayers, Cari S, PA-C  nicotine (NICODERM CQ - DOSED IN MG/24 HR) 7 mg/24hr patch Place 1 patch (7 mg total) onto the skin daily. 12/01/20  Yes Ladell Pier, MD  naproxen sodium (ALEVE) 220 MG tablet Take 220 mg by mouth. Patient not taking: Reported on 03/03/2021    [provider]    Current Outpatient Medications  Medication Sig Dispense Refill   amLODipine (NORVASC) 5 MG tablet Take 1 tablet (5 mg total) by mouth daily. 30 tablet 2   lisinopril (ZESTRIL) 5 MG tablet Take 1 tablet (5 mg total) by mouth daily. 30 tablet 2   metFORMIN (GLUCOPHAGE) 500 MG tablet Take 1 tablet (500 mg total) by mouth 2 (two) times daily with a meal. 30 tablet 2   nicotine (NICODERM CQ - DOSED IN MG/24 HR) 7 mg/24hr patch Place 1 patch (7 mg total) onto the skin daily. 28  patch 2   naproxen sodium (ALEVE) 220 MG tablet Take 220 mg by mouth. (Patient not taking: Reported on 03/03/2021)     Current Facility-Administered Medications  Medication Dose Route Frequency Provider Last Rate Last Admin   0.9 %  sodium chloride infusion  500 mL Intravenous Once Daryel November, MD        Allergies as of 03/03/2021 - Review Complete 03/03/2021  Allergen Reaction Noted   Terbinafine and related  10/20/2018    Family History  Problem Relation Age of Onset   Hypertension Mother    Diabetes Mother    Breast cancer Mother    Hypertension Maternal Grandmother    Hypertension Paternal Grandmother    Hypertension Paternal Grandfather    Colon cancer Neg Hx    Esophageal cancer Neg Hx    Rectal cancer Neg Hx    Stomach cancer Neg Hx     Social History   Socioeconomic History   Marital status: Married    Spouse name: Not on file   Number of children: 0   Years of education: Not on file   Highest education level: 12th grade  Occupational History   Occupation: Food services - [employer]  Tobacco Use   Smoking status: Every Day    Packs/day: 0.25  Types: Cigarettes   Smokeless tobacco: Never  Vaping Use   Vaping Use: Never used  Substance and Sexual Activity   Alcohol use: Yes    Comment: occasionally   Drug use: Yes    Types: Marijuana    Comment: occasionally   Sexual activity: Yes    Birth control/protection: None  Other Topics Concern   Not on file  Social History Narrative   Not on file   Social Determinants of Health   Financial Resource Strain: Not on file  Food Insecurity: Not on file  Transportation Needs: Not on file  Physical Activity: Not on file  Stress: Not on file  Social Connections: Not on file  Intimate Partner Violence: Not on file    Review of Systems: All other review of systems negative except as mentioned in the HPI.  Physical Exam: Vital signs BP 121/65   Pulse 73   Temp (!) 97.5 F (36.4 C) (Skin)   Ht 5\' 2"   (1.575 m)   Wt 186 lb (84.4 kg)   SpO2 98%   BMI 34.02 kg/m   General:   Alert,  Well-developed, well-nourished, pleasant and cooperative in NAD.  MP3 Lungs:  Clear throughout to auscultation.   Heart:  Regular rate and rhythm; no murmurs, clicks, rubs,  or gallops. Abdomen:  Soft, nontender and nondistended. Normal bowel sounds.   Neuro/Psych:  Normal mood and affect. A and O x 3   Lazer Wollard E. Candis Schatz, MD [facility]

## 2021-03-03 NOTE — Progress Notes (Signed)
Pt's states no medical or surgical changes since previsit or office visit. VS assessed by N.C ?

## 2021-03-03 NOTE — Progress Notes (Signed)
Called to room to assist during endoscopic procedure.  Patient ID and intended procedure confirmed with present staff. Received instructions for my participation in the procedure from the performing physician.  

## 2021-03-03 NOTE — Progress Notes (Signed)
Vss nad transferred to pacu 

## 2021-03-03 NOTE — Op Note (Signed)
[facility] Patient Name: Shelby Wyatt Procedure Date: 03/03/2021 8:54 AM MRN: [id number] Endoscopist: Nicki Reaper E. Candis Schatz , MD Age: 55 Referring MD:  Date of Birth: [birth year] Gender: Female Account #: [id number] Procedure:                Colonoscopy Indications:              Screening for colorectal malignant neoplasm, This                            is the patient's first colonoscopy Medicines:                Monitored Anesthesia Care Procedure:                Pre-Anesthesia Assessment:                           - Prior to the procedure, a History and Physical                            was performed, and patient medications and                            allergies were reviewed. The patient's tolerance of                            previous anesthesia was also reviewed. The risks                            and benefits of the procedure and the sedation                            options and risks were discussed with the patient.                            All questions were answered, and informed consent                            was obtained. Prior Anticoagulants: The patient has                            taken no previous anticoagulant or antiplatelet                            agents. ASA Grade Assessment: II - A patient with                            mild systemic disease. After reviewing the risks                            and benefits, the patient was deemed in                            satisfactory condition to undergo the procedure.  After obtaining informed consent, the colonoscope                            was passed under direct vision. Throughout the                            procedure, the patient's blood pressure, pulse, and                            oxygen saturations were monitored continuously. The                            CF [id number] #[id number] was introduced through the anus                            and advanced to  the the terminal ileum, with                            identification of the appendiceal orifice and IC                            valve. The colonoscopy was performed without                            difficulty. The patient tolerated the procedure                            well. The quality of the bowel preparation was                            good. The terminal ileum, ileocecal valve,                            appendiceal orifice, and rectum were photographed.                            The bowel preparation used was Plenvu via split                            dose instruction. Scope In: 9:00:50 AM Scope Out: 9:33:17 AM Scope Withdrawal Time: 0 hours 27 minutes 43 seconds  Total Procedure Duration: 0 hours 32 minutes 27 seconds  Findings:                 The perianal and digital rectal examinations were                            normal. Pertinent negatives include normal                            sphincter tone and no palpable rectal lesions.                           A 30 mm polyp was found in the cecum. The polyp was  sessile.                           Three sessile polyps were found in the ascending                            colon. The polyps were 2 to 5 mm in size. These                            polyps were removed with a cold snare. Resection                            and retrieval were complete. Estimated blood loss                            was minimal.                           A 12 mm polyp was found in the ascending colon. The                            polyp was sessile. The polyp was removed with a                            cold snare. Resection and retrieval were complete.                            Estimated blood loss was minimal.                           A 3 mm polyp was found in the hepatic flexure. The                            polyp was sessile. The polyp was removed with a                            cold snare. Resection  and retrieval were complete.                            Estimated blood loss was minimal.                           A 3 mm polyp was found in the transverse colon. The                            polyp was sessile. The polyp was removed with a                            cold snare. Resection and retrieval were complete.                            Estimated blood loss was minimal.  A 3 mm polyp was found in the splenic flexure. The                            polyp was sessile. The polyp was removed with a                            cold snare. Resection and retrieval were complete.                            Estimated blood loss was minimal.                           A 4 mm polyp was found in the sigmoid colon. The                            polyp was sessile. The polyp was removed with a                            cold snare. Resection and retrieval were complete.                            Estimated blood loss was minimal.                           The exam was otherwise normal throughout the                            examined colon.                           The terminal ileum appeared normal.                           The retroflexed view of the distal rectum and anal                            verge was normal and showed no anal or rectal                            abnormalities. Complications:            No immediate complications. Estimated Blood Loss:     Estimated blood loss was minimal. Impression:               - One 30 mm polyp in the cecum. Due to high risk                            polypectomy requiring lift and site closure,                            polypectomy should be performed in the hospital                            setting.                           -  Three 2 to 5 mm polyps in the ascending colon,                            removed with a cold snare. Resected and retrieved.                           - One 12 mm polyp in the ascending  colon, removed                            with a cold snare. Resected and retrieved.                           - One 3 mm polyp at the hepatic flexure, removed                            with a cold snare. Resected and retrieved.                           - One 3 mm polyp in the transverse colon, removed                            with a cold snare. Resected and retrieved.                           - One 3 mm polyp at the splenic flexure, removed                            with a cold snare. Resected and retrieved.                           - One 4 mm polyp in the sigmoid colon, removed with                            a cold snare. Resected and retrieved.                           - The examined portion of the ileum was normal.                           - The distal rectum and anal verge are normal on                            retroflexion view. Recommendation:           - Patient has a contact number available for                            emergencies. The signs and symptoms of potential                            delayed complications were discussed with the  patient. Return to normal activities tomorrow.                            Written discharge instructions were provided to the                            patient.                           - Resume previous diet.                           - Continue present medications.                           - Await pathology results.                           - Repeat colonoscopy in the at the next available                            hospital appointment for endoscopic mucosal                            resection of large cecal polyp. Koralyn Prestage E. Candis Schatz, MD 03/03/2021 9:46:47 AM This report has been signed electronically.

## 2021-03-05 ENCOUNTER — Other Ambulatory Visit: Payer: Self-pay

## 2021-03-05 ENCOUNTER — Ambulatory Visit: Payer: 59 | Attending: Internal Medicine | Admitting: Internal Medicine

## 2021-03-05 ENCOUNTER — Other Ambulatory Visit (HOSPITAL_COMMUNITY): Payer: Self-pay

## 2021-03-05 ENCOUNTER — Telehealth: Payer: Self-pay

## 2021-03-05 ENCOUNTER — Encounter: Payer: Self-pay | Admitting: Internal Medicine

## 2021-03-05 VITALS — BP 128/80 | HR 77 | Ht 62.0 in | Wt 189.0 lb

## 2021-03-05 DIAGNOSIS — R7303 Prediabetes: Secondary | ICD-10-CM

## 2021-03-05 DIAGNOSIS — T464X5A Adverse effect of angiotensin-converting-enzyme inhibitors, initial encounter: Secondary | ICD-10-CM

## 2021-03-05 DIAGNOSIS — F17210 Nicotine dependence, cigarettes, uncomplicated: Secondary | ICD-10-CM

## 2021-03-05 DIAGNOSIS — F17200 Nicotine dependence, unspecified, uncomplicated: Secondary | ICD-10-CM

## 2021-03-05 DIAGNOSIS — R058 Other specified cough: Secondary | ICD-10-CM | POA: Diagnosis not present

## 2021-03-05 DIAGNOSIS — E669 Obesity, unspecified: Secondary | ICD-10-CM | POA: Diagnosis not present

## 2021-03-05 DIAGNOSIS — I10 Essential (primary) hypertension: Secondary | ICD-10-CM

## 2021-03-05 DIAGNOSIS — Z23 Encounter for immunization: Secondary | ICD-10-CM

## 2021-03-05 DIAGNOSIS — Z1231 Encounter for screening mammogram for malignant neoplasm of breast: Secondary | ICD-10-CM

## 2021-03-05 MED ORDER — VALSARTAN 40 MG PO TABS
40.0000 mg | ORAL_TABLET | Freq: Every day | ORAL | 3 refills | Status: DC
  Filled 2021-03-05: qty 90, 90d supply, fill #0
  Filled 2021-05-25: qty 90, 90d supply, fill #1
  Filled 2021-11-23: qty 90, 90d supply, fill #2

## 2021-03-05 NOTE — Progress Notes (Signed)
[name], female    DOB: [birth year]  MRN: [id number]  CC: chronic ds management  Subjective: Shelby Wyatt is a 55 y.o. female who presents for chronic ds management Her concerns today include:  Tob dep, fecal incont, HTN, preDM  Saw GI since last visit with me and had colonoscopy done.  She had some polyps removed.  Pathology results are still pending on that.  HYPERTENSION Currently taking: see medication list.  She is on lisinopril and amlodipine. Med Adherence: [x]  Yes    []  No Medication side effects: [x]  Yes -he is having cough from the lisinopril.   []  No Adherence with salt restriction: [x]  Yes    []  No Home Monitoring?: [x]  Yes    []  No Monitoring Frequency:  Home BP results range: 132-140/75-89 SOB? []  Yes    [x]  No Chest Pain?: []  Yes    [x]  No Leg swelling?: []  Yes    [x]  No Headaches?: []  Yes    []  No Dizziness? []  Yes    []  No Comments:   PreDM/obesity: She walks her dog for about 10 to 15 minutes a day.  Feels she can do better with her eating habits.  Tob dep: Prescribed nicotine patches on last visit.  She feels she needs a higher dose on the 7 mg.  She also had issues with the patch falling off.    HM: Due for flu shot and mammogram. Patient Active Problem List   Diagnosis Date Noted   Essential hypertension 12/25/2020   Prediabetes 12/25/2020   Perimenopausal 12/25/2020   Tobacco dependence 12/01/2020   Incontinence of feces 12/01/2020   Abdominal pain 12/01/2020     Current Outpatient Medications on File Prior to Visit  Medication Sig Dispense Refill   amLODipine (NORVASC) 5 MG tablet Take 1 tablet (5 mg total) by mouth daily. 30 tablet 2   metFORMIN (GLUCOPHAGE) 500 MG tablet Take 1 tablet (500 mg total) by mouth 2 (two) times daily with a meal. 30 tablet 2   naproxen sodium (ALEVE) 220 MG tablet Take 220 mg by mouth.     nicotine (NICODERM CQ - DOSED IN MG/24 HR) 7 mg/24hr patch Place 1 patch (7 mg total) onto the skin  daily. 28 patch 2   No current facility-administered medications on file prior to visit.    Allergies  Allergen Reactions   Terbinafine And Related     Social History   Socioeconomic History   Marital status: Married    Spouse name: Not on file   Number of children: 0   Years of education: Not on file   Highest education level: 12th grade  Occupational History   Occupation: Food services - [employer]  Tobacco Use   Smoking status: Every Day    Packs/day: 0.25    Types: Cigarettes   Smokeless tobacco: Never  Vaping Use   Vaping Use: Never used  Substance and Sexual Activity   Alcohol use: Yes    Comment: occasionally   Drug use: Yes    Types: Marijuana    Comment: occasionally   Sexual activity: Yes    Birth control/protection: None  Other Topics Concern   Not on file  Social History Narrative   Not on file   Social Determinants of Health   Financial Resource Strain: Not on file  Food Insecurity: Not on file  Transportation Needs: Not on file  Physical Activity: Not on file  Stress: Not on file  Social Connections: Not  on file  Intimate Partner Violence: Not on file    Family History  Problem Relation Age of Onset   Hypertension Mother    Diabetes Mother    Breast cancer Mother    Hypertension Maternal Grandmother    Hypertension Paternal Grandmother    Hypertension Paternal Grandfather    Colon cancer Neg Hx    Esophageal cancer Neg Hx    Rectal cancer Neg Hx    Stomach cancer Neg Hx     Past Surgical History:  Procedure Laterality Date   laporoscopy     TUBAL LIGATION      ROS: Review of Systems Negative except as stated above  PHYSICAL EXAM: BP 128/80   Pulse 77   Ht 5\' 2"  (1.575 m)   Wt 189 lb (85.7 kg)   SpO2 99%   BMI 34.57 kg/m   Wt Readings from Last 3 Encounters:  03/05/21 189 lb (85.7 kg)  03/03/21 186 lb (84.4 kg)  01/13/21 186 lb (84.4 kg)    Physical Exam  General appearance - alert, well appearing, and in no  distress Mental status - normal mood, behavior, speech, dress, motor activity, and thought processes Neck - supple, no significant adenopathy Chest - clear to auscultation, no wheezes, rales or rhonchi, symmetric air entry Heart - normal rate, regular rhythm, normal S1, S2, no murmurs, rubs, clicks or gallops Extremities - peripheral pulses normal, no pedal edema, no clubbing or cyanosis   CMP Latest Ref Rng & Units 12/10/2020 10/20/2018  Glucose 65 - 99 mg/dL 148(H) 154(H)  BUN 6 - 24 mg/dL 12 9  Creatinine 0.57 - 1.00 mg/dL 0.62 0.75  Sodium 134 - 144 mmol/L 136 138  Potassium 3.5 - 5.2 mmol/L 4.8 3.6  Chloride 96 - 106 mmol/L 103 100  CO2 20 - 29 mmol/L 21 25  Calcium 8.7 - 10.2 mg/dL 9.3 9.4  Total Protein 6.0 - 8.5 g/dL 7.4 -  Total Bilirubin 0.0 - 1.2 mg/dL <0.2 -  Alkaline Phos 44 - 121 IU/L 96 -  AST 0 - 40 IU/L 14 -  ALT 0 - 32 IU/L 17 -   Lipid Panel     Component Value Date/Time   CHOL 155 12/10/2020 0958   TRIG 60 12/10/2020 0958   HDL 52 12/10/2020 0958   CHOLHDL 3.0 12/10/2020 0958   LDLCALC 91 12/10/2020 0958    CBC    Component Value Date/Time   WBC 6.1 12/10/2020 0958   WBC 7.6 10/20/2018 2303   RBC 4.56 12/10/2020 0958   RBC 5.02 10/20/2018 2303   HGB 12.7 12/10/2020 0958   HCT 38.2 12/10/2020 0958   PLT 258 12/10/2020 0958   MCV 84 12/10/2020 0958   MCH 27.9 12/10/2020 0958   MCH 21.1 (L) 10/20/2018 2303   MCHC 33.2 12/10/2020 0958   MCHC 29.9 (L) 10/20/2018 2303   RDW 14.7 12/10/2020 0958    ASSESSMENT AND PLAN: 1. Essential hypertension At goal.  Continue amlodipine.  Stop the Cozaar due to cough.  Changed to Diovan instead.  Encouraged her to check blood pressure at least once or twice a week and record the numbers with goal being 130/80 or lower.  She will send me some of her numbers in about 2 weeks. - valsartan (DIOVAN) 40 MG tablet; Take 1 tablet (40 mg total) by mouth daily.  Dispense: 90 tablet; Refill: 3  2. Tobacco dependence Advised  to quit and she is wanting to quit.  We will put her  on the 14 mg patch which is a higher dose.  3. Need for immunization against influenza - Flu Vaccine QUAD 17mo+IM (Fluarix, Fluzone & Alfiuria Quad PF)  4. Cough due to ACE inhibitor See #1 above.  5. Prediabetes 6. Obesity (BMI 30-39.9) Encouraged her to get in some form of moderate intensity exercise with a goal of 150 minutes/week total.  Dietary counseling given.  Asked to cut back on portion sizes of white carbohydrates, eliminate sugary drinks from the diet and eat more lean white meat instead of red meat.  7. Encounter for screening mammogram for malignant neoplasm of breast - MM Digital Screening; Future    Patient was given the opportunity to ask questions.  Patient verbalized understanding of the plan and was able to repeat key elements of the plan.   Orders Placed This Encounter  Procedures   MM Digital Screening   Flu Vaccine QUAD 4mo+IM (Fluarix, Fluzone & Alfiuria Quad PF)     Requested Prescriptions   Signed Prescriptions Disp Refills   valsartan (DIOVAN) 40 MG tablet 90 tablet 3    Sig: Take 1 tablet (40 mg total) by mouth daily.    Return in about 4 months (around 07/05/2021).  Karle Plumber, MD, FACP

## 2021-03-05 NOTE — Patient Instructions (Signed)
Stop lisinopril. Start Diovan 40 mg daily instead of the lisinopril.  Continue to monitor your blood pressure with goal being 130/80 or lower.

## 2021-03-05 NOTE — Telephone Encounter (Signed)
  Follow up Call-  Call back number 03/03/2021  Post procedure Call Back phone  # [phone]  Permission to leave phone message Yes  Some recent data might be hidden     Patient questions:  Do you have a fever, pain , or abdominal swelling? No. Pain Score  0 *  Have you tolerated food without any problems? Yes.    Have you been able to return to your normal activities? Yes.    Do you have any questions about your discharge instructions: Diet   No. Medications  No. Follow up visit  No.  Do you have questions or concerns about your Care? No.  Actions: * If pain score is 4 or above: No action needed, pain <4.

## 2021-03-06 ENCOUNTER — Telehealth: Payer: Self-pay

## 2021-03-06 ENCOUNTER — Other Ambulatory Visit: Payer: Self-pay

## 2021-03-06 DIAGNOSIS — K635 Polyp of colon: Secondary | ICD-10-CM

## 2021-03-06 MED ORDER — PLENVU 140 G PO SOLR: ORAL | 0 refills | Status: DC

## 2021-03-06 NOTE — Telephone Encounter (Signed)
Patient notified of plan and consents to proceed with colon EMR at [facility] with Dr. Rush Landmark.  She has been scheduled for 04/07/21 7:30 am.  She is scheduled for an office visit with Dr. Rush Landmark to review on 04/01/21 at 2:30.  Prep sent to her pharmacy.  Instructions provided to Dr. Donneta Romberg CMA Helmut Muster Amedeo Gory

## 2021-03-06 NOTE — Telephone Encounter (Signed)
-----   Message from Irving Copas., MD sent at 03/05/2021  4:50 PM EDT ----- Barbera Setters, I would be happy to help.  This is a large polyp and I need to talk with the patient about an advanced resection so she should be scheduled in follow-up clinic with me.  Okay to overbook if needed in the weeks before the procedure or use my help slots to get her in. This should be scheduled as a 90-minute colon with EMR in the hospital-based setting. Please let Dr. Candis Schatz and I know when she is scheduled. Thanks. GM ----- Message ----- From: Marlon Pel, RN Sent: 03/05/2021   4:42 PM EDT To: Irving Copas., MD  Dr. Rush Landmark,  Could you do this cecal polyp removal for Dr. Candis Schatz on 10/25 in your yellow block am/purple block pm?

## 2021-03-13 ENCOUNTER — Encounter: Payer: Self-pay | Admitting: Gastroenterology

## 2021-03-13 NOTE — Progress Notes (Signed)
Shelby Wyatt,   [place] of the eight polyps that I removed during your recent procedure were completely benign but were proven to be "pre-cancerous" polyps that MAY have grown into cancers if they had not been removed.  Studies shows that at least 20% of women over age 53 and 30% of men over age 22 have pre-cancerous polyps.  You still need to have the very large cecal polyp removed in the hospital.    Shelby Wyatt, she is scheduled with Dr. Rush Landmark for EMR of the cecal polyp in late October, correct?

## 2021-03-23 ENCOUNTER — Encounter: Payer: Self-pay | Admitting: Dietician

## 2021-03-23 ENCOUNTER — Other Ambulatory Visit: Payer: Self-pay

## 2021-03-23 ENCOUNTER — Encounter: Payer: 59 | Attending: Internal Medicine | Admitting: Dietician

## 2021-03-23 DIAGNOSIS — R7303 Prediabetes: Secondary | ICD-10-CM | POA: Insufficient documentation

## 2021-03-23 NOTE — Patient Instructions (Addendum)
Try adding some arugula to your mixed greens for some added flavor in your salad.  Pour your salad dressing into a small bowl and dip your bites of salad in it instead of pouring it onto your salad.  Begin to increase your physical activity. 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 Walk the treadmill or try the elliptical! Start by going 1 - 2 days a week, for 15-20 minutes.  Make it a point to continue to finish 2 of your 33 oz. SmartWater bottle each day.  Choose whole grains like quinoa and cous cous. Try plant based pastas for extra protein and fiber.

## 2021-03-23 NOTE — Progress Notes (Signed)
Medical Nutrition Therapy  Appointment Start time:  1620  Appointment End time:  1650  Primary concerns today: Prediabetes  Referral diagnosis: R73.03 - Prediabetes Preferred learning style: No preference indicated Learning readiness: Ready   NUTRITION ASSESSMENT   Anthropometrics  Ht: 5'2" Wt: 186.4 lbs Body mass index is 34.09 kg/m.   Clinical Medical Hx: HTN, Prediabetes, Tobacco use Medications: Lisinopril, metformin, Valsartan, Nicoderm patch Labs: A1c - 6.0 Notable Signs/Symptoms: N/A   Lifestyle & Dietary Hx Pt reports starting their metformin BID, taking with food for breakfast and dinner. No side effects or GI upset.  Pt has been using divided plates to build their meals occasionally. Pt reports noticing that they get fuller sooner at times. Pt reports switching over to whole wheat bread, drinks a smoothie with oatmeal occasionally, cut down on fried foods, more salads (kale, eggs, cucumbers, ham, bacon bits, sunflower seeds, walnuts, full-fat french dressing). Pt reports struggling with portion sizes and eating seconds at times. Pt reports staring a nicotine patch to quit smoking, had difficulties getting patch to stay on. Pt is considering starting nicotine gum. Pt is still smoking, but is smoking considerably less. Pt still walks their dog about 15 minutes each day, pt knows they need to increase their physical activity. Pt states their knee pain hinders them from certain exercises. Pt has been drinking more water, usually 64 oz a day.    Estimated daily fluid intake: 50-60 oz Supplements: Centrum silver women's One-A-Day Sleep: Not great, has difficulty falling asleep occasionally Stress / self-care: Marital conflicts Current average weekly physical activity: ADLs, walks dog, dances   24-Hr Dietary Recall First Meal: Banana nut muffin, coffee w/coffee mate, Splenda Snack: none Second Meal: Brendolyn Patty Double Whopper w/cheese, Lay's chips, regular Coke Snack:  none Third Meal: Moe's Beef burrito, Coca-cola Snack: none Beverages: Coca-cola, water   NUTRITION DIAGNOSIS  NB-1.1 Food and nutrition-related knowledge deficit As related to prediabetes.  As evidenced by A1c of 6.0, skipping lunch, and self-reported over consumption at meals.   NUTRITION INTERVENTION  Nutrition education (E-1) on the following topics:  Educated patient on the pathophysiology of diabetes. This includes why our bodies need circulating blood sugar, the relationship between insulin and blood sugar, and the results of insulin resistance and/or pancreatic insufficiency on the development of diabetes. Educated patient on factors that contribute to elevation of blood sugars, such as stress, illness, injury,and food choices. Discussed the role that physical activity plays in lowering blood sugar. Educate patient on the three main macronutrients. Protein, fats, and carbohydrates. Discussed how each of these macronutrients affect blood sugar levels, especially carbohydrate, and the importance of eating a consistent amount of carbohydrate throughout the day.   Handouts Provided Include  Balanced Plate Balanced Plate Food List  Learning Style & Readiness for Change Teaching method utilized: Visual & Auditory  Demonstrated degree of understanding via: Teach Back  Barriers to learning/adherence to lifestyle change: None  Goals Established by Pt Try adding some arugula to your mixed greens for some added flavor in your salad. Pour your salad dressing into a small bowl and dip your bites of salad in it instead of pouring it onto your salad. Begin to increase your physical activity. 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 Walk the treadmill or try the elliptical! Start by going 1 - 2 days a week, for 15-20 minutes. Make it a point to continue to finish 2 of your 33 oz. SmartWater bottle each day.  Choose whole grains like quinoa and cous cous. Try plant  based pastas for extra protein and fiber.   MONITORING & EVALUATION Dietary intake, weekly physical activity, and meal frequency in 2 months.  Next Steps  Patient is to follow up with RD.

## 2021-03-27 ENCOUNTER — Encounter (HOSPITAL_COMMUNITY): Payer: Self-pay | Admitting: Gastroenterology

## 2021-04-01 ENCOUNTER — Encounter: Payer: Self-pay | Admitting: Gastroenterology

## 2021-04-01 ENCOUNTER — Other Ambulatory Visit (HOSPITAL_COMMUNITY): Payer: Self-pay

## 2021-04-01 ENCOUNTER — Other Ambulatory Visit (INDEPENDENT_AMBULATORY_CARE_PROVIDER_SITE_OTHER): Payer: 59

## 2021-04-01 ENCOUNTER — Ambulatory Visit (INDEPENDENT_AMBULATORY_CARE_PROVIDER_SITE_OTHER): Payer: 59 | Admitting: Gastroenterology

## 2021-04-01 VITALS — BP 132/70 | HR 78 | Ht 62.0 in | Wt 183.0 lb

## 2021-04-01 DIAGNOSIS — K635 Polyp of colon: Secondary | ICD-10-CM

## 2021-04-01 DIAGNOSIS — R933 Abnormal findings on diagnostic imaging of other parts of digestive tract: Secondary | ICD-10-CM

## 2021-04-01 DIAGNOSIS — Z86010 Personal history of colonic polyps: Secondary | ICD-10-CM

## 2021-04-01 LAB — CBC
HCT: 41.8 % (ref 36.0–46.0)
Hemoglobin: 13.9 g/dL (ref 12.0–15.0)
MCHC: 33.3 g/dL (ref 30.0–36.0)
MCV: 82.8 fl (ref 78.0–100.0)
Platelets: 227 10*3/uL (ref 150.0–400.0)
RBC: 5.05 Mil/uL (ref 3.87–5.11)
RDW: 16.3 % — ABNORMAL HIGH (ref 11.5–15.5)
WBC: 5.5 10*3/uL (ref 4.0–10.5)

## 2021-04-01 LAB — BASIC METABOLIC PANEL
BUN: 9 mg/dL (ref 6–23)
CO2: 25 mEq/L (ref 19–32)
Calcium: 9.4 mg/dL (ref 8.4–10.5)
Chloride: 104 mEq/L (ref 96–112)
Creatinine, Ser: 0.66 mg/dL (ref 0.40–1.20)
GFR: 100.4 mL/min (ref >60.00)
Glucose, Bld: 84 mg/dL (ref 70–99)
Potassium: 3.8 mEq/L (ref 3.5–5.1)
Sodium: 137 mEq/L (ref 135–145)

## 2021-04-01 LAB — PROTIME-INR
INR: 1.2 ratio — ABNORMAL HIGH (ref 0.8–1.0)
Prothrombin Time: 12.9 s (ref 9.6–13.1)

## 2021-04-01 NOTE — Patient Instructions (Signed)
Your provider has requested that you go to the basement level for lab work before leaving today. Press "B" on the elevator. The lab is located at the first door on the left as you exit the elevator.  Due to recent changes in healthcare laws, you may see the results of your imaging and laboratory studies on MyChart before your provider has had a chance to review them.  We understand that in some cases there may be results that are confusing or concerning to you. Not all laboratory results come back in the same time frame and the provider may be waiting for multiple results in order to interpret others.  Please give [place] 48 hours in order for your provider to thoroughly review all the results before contacting the office for clarification of your results.   You have been scheduled for a colonoscopy. Please follow written instructions given to you at your visit today.  Please pick up your prep supplies at the pharmacy within the next 1-3 days. If you use inhalers (even only as needed), please bring them with you on the day of your procedure.  If you are age 23 or older, your body mass index should be between 23-30. Your Body mass index is 33.47 kg/m. If this is out of the aforementioned range listed, please consider follow up with your Primary Care Provider.  If you are age 37 or younger, your body mass index should be between 19-25. Your Body mass index is 33.47 kg/m. If this is out of the aformentioned range listed, please consider follow up with your Primary Care Provider.   ________________________________________________________  The [facility] providers would like to encourage you to use [facility] to communicate with providers for non-urgent requests or questions.  Due to long hold times on the telephone, sending your provider a message by [facility] may be a faster and more efficient way to get a response.  Please allow 48 business hours for a response.  Please remember that this is for non-urgent  requests.  _______________________________________________________  Thank you for choosing me and [facility].  Dr. Rush Landmark

## 2021-04-02 ENCOUNTER — Encounter: Payer: Self-pay | Admitting: Gastroenterology

## 2021-04-02 ENCOUNTER — Telehealth: Payer: Self-pay | Admitting: Internal Medicine

## 2021-04-02 DIAGNOSIS — F17200 Nicotine dependence, unspecified, uncomplicated: Secondary | ICD-10-CM

## 2021-04-02 DIAGNOSIS — R933 Abnormal findings on diagnostic imaging of other parts of digestive tract: Secondary | ICD-10-CM | POA: Insufficient documentation

## 2021-04-02 DIAGNOSIS — Z86010 Personal history of colonic polyps: Secondary | ICD-10-CM | POA: Insufficient documentation

## 2021-04-02 DIAGNOSIS — K635 Polyp of colon: Secondary | ICD-10-CM | POA: Insufficient documentation

## 2021-04-02 MED ORDER — NICOTINE 14 MG/24HR TD PT24
14.0000 mg | MEDICATED_PATCH | Freq: Every day | TRANSDERMAL | 2 refills | Status: DC
  Filled 2021-04-02: qty 28, 28d supply, fill #0
  Filled 2022-02-07: qty 28, 28d supply, fill #1

## 2021-04-02 NOTE — Telephone Encounter (Signed)
Will forward to provider  

## 2021-04-02 NOTE — Progress Notes (Signed)
[place] VISIT   Primary Care Provider Ladell Pier, MD [place] [id number] [phone]  Referring Provider Dr. Candis Schatz  Patient Profile: Shelby Wyatt is a 55 y.o. female with a pmh significant for hypertension, allergies, prediabetes, tobacco use disorder, colon polyps (TAs & SSPs including cecal polyp left in situ).  The patient presents to the [facility] for an evaluation and management of problem(s) noted below:  Problem List 1. Cecal polyp   2. Hx of adenomatous colonic polyps   3. Serrated polyp of colon   4. Abnormal colonoscopy     History of Present Illness Please see initial consultation note by Dr. Candis Schatz for full details of HPI.  Interval History The patient underwent recent colonoscopy and evaluation of symptoms that she had seen Dr. Candis Schatz for.  Multiple polyps were found including a large polyp of the cecum was found.  Is for this reason that the patient is referred for consideration of advanced resection attempt.  Patient has had no alteration of her symptoms since seeing Dr. Bryan Lemma.  Currently she is not experiencing any blood per rectum and fecal smearing is not occurring as frequently.  GI Review of Systems Positive as above Negative for dysphagia, odynophagia, melena, hematochezia  Review of Systems General: Denies fevers/chills/weight loss unintentionally Cardiovascular: Denies chest pain Pulmonary: Denies shortness of breath Gastroenterological: See HPI Genitourinary: Denies darkened urine Hematological: Denies easy bruising/bleeding Dermatological: Denies jaundice Psychological: Mood is stable   Medications Current Outpatient Medications  Medication Sig Dispense Refill   amLODipine (NORVASC) 5 MG tablet Take 1 tablet (5 mg total) by mouth daily. (Patient taking differently: Take 5 mg by mouth at bedtime.) 30 tablet 2   metFORMIN (GLUCOPHAGE) 500 MG  tablet Take 1 tablet (500 mg total) by mouth 2 (two) times daily with a meal. 30 tablet 2   Multiple Vitamins-Minerals (MULTIVITAMIN WITH MINERALS) tablet Take 1 tablet by mouth daily. Centrum 50+     naproxen sodium (ALEVE) 220 MG tablet Take 440 mg by mouth daily as needed (cramps).     PLENVU 140 g SOLR Take per colonoscopy instrucitons 1 each 0   valsartan (DIOVAN) 40 MG tablet Take 1 tablet (40 mg total) by mouth daily. (Patient taking differently: Take 40 mg by mouth at bedtime.) 90 tablet 3   nicotine (NICODERM CQ - DOSED IN MG/24 HOURS) 14 mg/24hr patch Place 1 patch (14 mg total) onto the skin daily. 30 patch 2   No current facility-administered medications for this visit.    Allergies Allergies  Allergen Reactions   Lisinopril Cough   Terbinafine And Related Hives    Histories Past Medical History:  Diagnosis Date   Allergy    Hypertension    No pertinent past medical history    Tubal pregnancy    Past Surgical History:  Procedure Laterality Date   COLONOSCOPY     laporoscopy     TUBAL LIGATION     Social History   Socioeconomic History   Marital status: Married    Spouse name: Not on file   Number of children: 0   Years of education: Not on file   Highest education level: 12th grade  Occupational History   Occupation: Food services - [employer]  Tobacco Use   Smoking status: Every Day    Packs/day: 0.25    Types: Cigarettes   Smokeless tobacco: Never  Vaping Use   Vaping Use: Never used  Substance and Sexual Activity  Alcohol use: Yes    Comment: occasionally   Drug use: Yes    Types: Marijuana    Comment: occasionally   Sexual activity: Yes    Birth control/protection: None  Other Topics Concern   Not on file  Social History Narrative   Not on file   Social Determinants of Health   Financial Resource Strain: Not on file  Food Insecurity: Not on file  Transportation Needs: Not on file  Physical Activity: Not on file  Stress: Not on file   Social Connections: Not on file  Intimate Partner Violence: Not on file   Family History  Problem Relation Age of Onset   Hypertension Mother    Diabetes Mother    Breast cancer Mother    Hypertension Maternal Grandmother    Hypertension Paternal Grandmother    Hypertension Paternal Grandfather    Colon cancer Neg Hx    Esophageal cancer Neg Hx    Rectal cancer Neg Hx    Stomach cancer Neg Hx    Inflammatory bowel disease Neg Hx    Liver disease Neg Hx    Pancreatic cancer Neg Hx    I have reviewed her medical, social, and family history in detail and updated the electronic medical record as necessary.    PHYSICAL EXAMINATION  BP 132/70   Pulse 78   Ht 5\' 2"  (1.575 m)   Wt 183 lb (83 kg)   SpO2 98%   BMI 33.47 kg/m  Wt Readings from Last 3 Encounters:  04/01/21 183 lb (83 kg)  03/23/21 186 lb 6.4 oz (84.6 kg)  03/05/21 189 lb (85.7 kg)  GEN: NAD, appears stated age, doesn't appear chronically ill PSYCH: Cooperative, without pressured speech EYE: Conjunctivae pink, sclerae anicteric ENT: MMM CV: Nontachycardic RESP: No audible wheezing GI: NABS, soft, NT/ND, without rebound MSK/EXT: No lower extremity edema SKIN: No jaundice NEURO:  Alert & Oriented x 3, no focal deficits   REVIEW OF DATA  I reviewed the following data at the time of this encounter:  GI Procedures and Studies  September 2022 colonoscopy - One 30 mm polyp in the cecum. Due to high risk polypectomy requiring lift and site closure, polypectomy should be performed in the hospital setting. - Three 2 to 5 mm polyps in the ascending colon, removed with a cold snare. Resected and retrieved. - One 12 mm polyp in the ascending colon, removed with a cold snare. Resected and retrieved. - One 3 mm polyp at the hepatic flexure, removed with a cold snare. Resected and retrieved. - One 3 mm polyp in the transverse colon, removed with a cold snare. Resected and retrieved. - One 3 mm polyp at the splenic  flexure, removed with a cold snare. Resected and retrieved. - One 4 mm polyp in the sigmoid colon, removed with a cold snare. Resected and retrieved. - The examined portion of the ileum was normal. - The distal rectum and anal verge are normal on retroflexion view.  Pathology Diagnosis 1. Surgical [P], colon, ascending, polyp (4) - TUBULAR ADENOMA (TWO). - NO HIGH GRADE DYSPLASIA OR CARCINOMA. - SESSILE SERRATED POLYP WITHOUT CYTOLOGIC DYSPLASIA (TWO). 2. Surgical [P], colon, hepatic flexure, transverse, splenic flexure, polyp (3) - SESSILE SERRATED POLYP WITHOUT CYTOLOGIC DYSPLASIA (ONE). - COLONIC MUCOSA WITH BENIGN LYMPHOID AGGREGATE (TWO). 3. Surgical [P], colon, sigmoid, polyp (1) - HYPERPLASTIC POLYP. - NO ADENOMATOUS CHANGE OR CARCINOMA.  Laboratory Studies  Reviewed those in epic and care everywhere  Imaging Studies  No relevant studies  to review   ASSESSMENT  Shelby Wyatt is a 55 y.o. female with a pmh significant for hypertension, allergies, prediabetes, tobacco use disorder, colon polyps (TAs & SSPs including cecal polyp left in situ).  The patient is seen today for evaluation and management of:  1. Cecal polyp   2. Hx of adenomatous colonic polyps   3. Serrated polyp of colon   4. Abnormal colonoscopy    The patient is clinically and hemodynamically stable at this time.  Based upon the description and endoscopic pictures I do feel that it is reasonable to pursue an Advanced Polypectomy attempt of the polyp/lesion.  We discussed some of the techniques of advanced polypectomy which include Endoscopic Mucosal Resection, OVESCO Full-Thickness Resection, Endorotor Morcellation, and Tissue Ablation via Fulguration.  We also reviewed images of typical techniques as noted above.  The risks and benefits of endoscopic evaluation were discussed with the patient; these include but are not limited to the risk of perforation, infection, bleeding, missed lesions, lack of diagnosis,  severe illness requiring hospitalization, as well as anesthesia and sedation related illnesses.  During attempts at advanced resection, the risks of bleeding and perforation/leak are increased as opposed to diagnostic and screening procedures, and that was discussed with the patient as well.   In addition, I explained that with the possible need for piecemeal resection, subsequent short-interval endoscopic evaluation for follow up and potential retreatment of the lesion/area may be necessary.  I did offer, a referral to surgery in order for patient to have opportunity to discuss surgical management/intervention prior to finalizing decision for attempt at endoscopic removal, however, the patient deferred on this.  If, after attempt at removal of the polyp/lesion, it is found that the patient has a complication or that an invasive lesion or malignant lesion is found, or that the polyp/lesion continues to recur, the patient is aware and understands that surgery may still be indicated/required.  All patient questions were answered, to the best of my ability, and the patient agrees to the aforementioned plan of action with follow-up as indicated.   PLAN  Preprocedure labs to be obtained Proceed with scheduled colonoscopy with EMR/advanced resection attempt next week as already scheduled Follow-up to be dictated by results of colonoscopy Further work-up/evaluation as per primary gastroenterologist   Orders Placed This Encounter  Procedures   CBC   Basic Metabolic Panel (BMET)   INR/PT    New Prescriptions   No medications on file   Modified Medications   Modified Medication Previous Medication   NICOTINE (NICODERM CQ - DOSED IN MG/24 HOURS) 14 MG/24HR PATCH nicotine (NICODERM CQ - DOSED IN MG/24 HR) 7 mg/24hr patch      Place 1 patch (14 mg total) onto the skin daily.    Place 1 patch (7 mg total) onto the skin daily.    Planned Follow Up No follow-ups on file.   Total Time in Face-to-Face  and in Coordination of Care for patient including independent/personal interpretation/review of prior testing, medical history, examination, medication adjustment, communicating results with the patient directly, and documentation within the EHR is 35 minutes.   Justice Britain, MD [facility] Office # [phone]

## 2021-04-02 NOTE — Telephone Encounter (Signed)
Copied from [place] [id number]. Topic: General - Inquiry >> Apr 02, 2021  2:52 PM Greggory Keen D wrote: Reason for CRM: Pt called saying that when she was in on the 21 of sept she talked to Dr. Wynetta Emery about upping the nicotine patch to 14 mg saying it 7 mg was not helping.  She said she thought Dr. Wynetta Emery was going to do that but no one has yet.  CB#  [phone]

## 2021-04-02 NOTE — H&P (View-Only) (Signed)
[place] VISIT   Primary Care Provider Ladell Pier, MD [place] [id number] [phone]  Referring Provider Dr. Candis Schatz  Patient Profile: Shelby Wyatt is a 55 y.o. female with a pmh significant for hypertension, allergies, prediabetes, tobacco use disorder, colon polyps (TAs & SSPs including cecal polyp left in situ).  The patient presents to the [facility] for an evaluation and management of problem(s) noted below:  Problem List 1. Cecal polyp   2. Hx of adenomatous colonic polyps   3. Serrated polyp of colon   4. Abnormal colonoscopy     History of Present Illness Please see initial consultation note by Dr. Candis Schatz for full details of HPI.  Interval History The patient underwent recent colonoscopy and evaluation of symptoms that she had seen Dr. Candis Schatz for.  Multiple polyps were found including a large polyp of the cecum was found.  Is for this reason that the patient is referred for consideration of advanced resection attempt.  Patient has had no alteration of her symptoms since seeing Dr. Bryan Lemma.  Currently she is not experiencing any blood per rectum and fecal smearing is not occurring as frequently.  GI Review of Systems Positive as above Negative for dysphagia, odynophagia, melena, hematochezia  Review of Systems General: Denies fevers/chills/weight loss unintentionally Cardiovascular: Denies chest pain Pulmonary: Denies shortness of breath Gastroenterological: See HPI Genitourinary: Denies darkened urine Hematological: Denies easy bruising/bleeding Dermatological: Denies jaundice Psychological: Mood is stable   Medications Current Outpatient Medications  Medication Sig Dispense Refill   amLODipine (NORVASC) 5 MG tablet Take 1 tablet (5 mg total) by mouth daily. (Patient taking differently: Take 5 mg by mouth at bedtime.) 30 tablet 2   metFORMIN (GLUCOPHAGE) 500 MG  tablet Take 1 tablet (500 mg total) by mouth 2 (two) times daily with a meal. 30 tablet 2   Multiple Vitamins-Minerals (MULTIVITAMIN WITH MINERALS) tablet Take 1 tablet by mouth daily. Centrum 50+     naproxen sodium (ALEVE) 220 MG tablet Take 440 mg by mouth daily as needed (cramps).     PLENVU 140 g SOLR Take per colonoscopy instrucitons 1 each 0   valsartan (DIOVAN) 40 MG tablet Take 1 tablet (40 mg total) by mouth daily. (Patient taking differently: Take 40 mg by mouth at bedtime.) 90 tablet 3   nicotine (NICODERM CQ - DOSED IN MG/24 HOURS) 14 mg/24hr patch Place 1 patch (14 mg total) onto the skin daily. 30 patch 2   No current facility-administered medications for this visit.    Allergies Allergies  Allergen Reactions   Lisinopril Cough   Terbinafine And Related Hives    Histories Past Medical History:  Diagnosis Date   Allergy    Hypertension    No pertinent past medical history    Tubal pregnancy    Past Surgical History:  Procedure Laterality Date   COLONOSCOPY     laporoscopy     TUBAL LIGATION     Social History   Socioeconomic History   Marital status: Married    Spouse name: Not on file   Number of children: 0   Years of education: Not on file   Highest education level: 12th grade  Occupational History   Occupation: Food services - [employer]  Tobacco Use   Smoking status: Every Day    Packs/day: 0.25    Types: Cigarettes   Smokeless tobacco: Never  Vaping Use   Vaping Use: Never used  Substance and Sexual Activity  Alcohol use: Yes    Comment: occasionally   Drug use: Yes    Types: Marijuana    Comment: occasionally   Sexual activity: Yes    Birth control/protection: None  Other Topics Concern   Not on file  Social History Narrative   Not on file   Social Determinants of Health   Financial Resource Strain: Not on file  Food Insecurity: Not on file  Transportation Needs: Not on file  Physical Activity: Not on file  Stress: Not on file   Social Connections: Not on file  Intimate Partner Violence: Not on file   Family History  Problem Relation Age of Onset   Hypertension Mother    Diabetes Mother    Breast cancer Mother    Hypertension Maternal Grandmother    Hypertension Paternal Grandmother    Hypertension Paternal Grandfather    Colon cancer Neg Hx    Esophageal cancer Neg Hx    Rectal cancer Neg Hx    Stomach cancer Neg Hx    Inflammatory bowel disease Neg Hx    Liver disease Neg Hx    Pancreatic cancer Neg Hx    I have reviewed her medical, social, and family history in detail and updated the electronic medical record as necessary.    PHYSICAL EXAMINATION  BP 132/70   Pulse 78   Ht 5\' 2"  (1.575 m)   Wt 183 lb (83 kg)   SpO2 98%   BMI 33.47 kg/m  Wt Readings from Last 3 Encounters:  04/01/21 183 lb (83 kg)  03/23/21 186 lb 6.4 oz (84.6 kg)  03/05/21 189 lb (85.7 kg)  GEN: NAD, appears stated age, doesn't appear chronically ill PSYCH: Cooperative, without pressured speech EYE: Conjunctivae pink, sclerae anicteric ENT: MMM CV: Nontachycardic RESP: No audible wheezing GI: NABS, soft, NT/ND, without rebound MSK/EXT: No lower extremity edema SKIN: No jaundice NEURO:  Alert & Oriented x 3, no focal deficits   REVIEW OF DATA  I reviewed the following data at the time of this encounter:  GI Procedures and Studies  September 2022 colonoscopy - One 30 mm polyp in the cecum. Due to high risk polypectomy requiring lift and site closure, polypectomy should be performed in the hospital setting. - Three 2 to 5 mm polyps in the ascending colon, removed with a cold snare. Resected and retrieved. - One 12 mm polyp in the ascending colon, removed with a cold snare. Resected and retrieved. - One 3 mm polyp at the hepatic flexure, removed with a cold snare. Resected and retrieved. - One 3 mm polyp in the transverse colon, removed with a cold snare. Resected and retrieved. - One 3 mm polyp at the splenic  flexure, removed with a cold snare. Resected and retrieved. - One 4 mm polyp in the sigmoid colon, removed with a cold snare. Resected and retrieved. - The examined portion of the ileum was normal. - The distal rectum and anal verge are normal on retroflexion view.  Pathology Diagnosis 1. Surgical [P], colon, ascending, polyp (4) - TUBULAR ADENOMA (TWO). - NO HIGH GRADE DYSPLASIA OR CARCINOMA. - SESSILE SERRATED POLYP WITHOUT CYTOLOGIC DYSPLASIA (TWO). 2. Surgical [P], colon, hepatic flexure, transverse, splenic flexure, polyp (3) - SESSILE SERRATED POLYP WITHOUT CYTOLOGIC DYSPLASIA (ONE). - COLONIC MUCOSA WITH BENIGN LYMPHOID AGGREGATE (TWO). 3. Surgical [P], colon, sigmoid, polyp (1) - HYPERPLASTIC POLYP. - NO ADENOMATOUS CHANGE OR CARCINOMA.  Laboratory Studies  Reviewed those in epic and care everywhere  Imaging Studies  No relevant studies  to review   ASSESSMENT  Ms. Lukic is a 55 y.o. female with a pmh significant for hypertension, allergies, prediabetes, tobacco use disorder, colon polyps (TAs & SSPs including cecal polyp left in situ).  The patient is seen today for evaluation and management of:  1. Cecal polyp   2. Hx of adenomatous colonic polyps   3. Serrated polyp of colon   4. Abnormal colonoscopy    The patient is clinically and hemodynamically stable at this time.  Based upon the description and endoscopic pictures I do feel that it is reasonable to pursue an Advanced Polypectomy attempt of the polyp/lesion.  We discussed some of the techniques of advanced polypectomy which include Endoscopic Mucosal Resection, OVESCO Full-Thickness Resection, Endorotor Morcellation, and Tissue Ablation via Fulguration.  We also reviewed images of typical techniques as noted above.  The risks and benefits of endoscopic evaluation were discussed with the patient; these include but are not limited to the risk of perforation, infection, bleeding, missed lesions, lack of diagnosis,  severe illness requiring hospitalization, as well as anesthesia and sedation related illnesses.  During attempts at advanced resection, the risks of bleeding and perforation/leak are increased as opposed to diagnostic and screening procedures, and that was discussed with the patient as well.   In addition, I explained that with the possible need for piecemeal resection, subsequent short-interval endoscopic evaluation for follow up and potential retreatment of the lesion/area may be necessary.  I did offer, a referral to surgery in order for patient to have opportunity to discuss surgical management/intervention prior to finalizing decision for attempt at endoscopic removal, however, the patient deferred on this.  If, after attempt at removal of the polyp/lesion, it is found that the patient has a complication or that an invasive lesion or malignant lesion is found, or that the polyp/lesion continues to recur, the patient is aware and understands that surgery may still be indicated/required.  All patient questions were answered, to the best of my ability, and the patient agrees to the aforementioned plan of action with follow-up as indicated.   PLAN  Preprocedure labs to be obtained Proceed with scheduled colonoscopy with EMR/advanced resection attempt next week as already scheduled Follow-up to be dictated by results of colonoscopy Further work-up/evaluation as per primary gastroenterologist   Orders Placed This Encounter  Procedures   CBC   Basic Metabolic Panel (BMET)   INR/PT    New Prescriptions   No medications on file   Modified Medications   Modified Medication Previous Medication   NICOTINE (NICODERM CQ - DOSED IN MG/24 HOURS) 14 MG/24HR PATCH nicotine (NICODERM CQ - DOSED IN MG/24 HR) 7 mg/24hr patch      Place 1 patch (14 mg total) onto the skin daily.    Place 1 patch (7 mg total) onto the skin daily.    Planned Follow Up No follow-ups on file.   Total Time in Face-to-Face  and in Coordination of Care for patient including independent/personal interpretation/review of prior testing, medical history, examination, medication adjustment, communicating results with the patient directly, and documentation within the EHR is 35 minutes.   Justice Britain, MD [place] [facility] Advanced [facility]oscopy Office # [phone]

## 2021-04-03 ENCOUNTER — Other Ambulatory Visit (HOSPITAL_COMMUNITY): Payer: Self-pay

## 2021-04-06 ENCOUNTER — Encounter (HOSPITAL_COMMUNITY): Payer: Self-pay | Admitting: Gastroenterology

## 2021-04-06 NOTE — Anesthesia Preprocedure Evaluation (Addendum)
Anesthesia Evaluation  Patient identified by MRN, date of birth, ID band Patient awake    Reviewed: Allergy & Precautions, NPO status , Patient's Chart, lab work & pertinent test results  Airway Mallampati: II  TM Distance: >3 FB Neck ROM: Full    Dental  (+) Teeth Intact   Pulmonary neg pulmonary ROS, Current Smoker,    Pulmonary exam normal        Cardiovascular hypertension, Pt. on medications  Rhythm:Regular Rate:Normal     Neuro/Psych negative neurological ROS  negative psych ROS   GI/Hepatic Neg liver ROS, Cecal polyp   Endo/Other  diabetes, Type 2, Oral Hypoglycemic Agents  Renal/GU negative Renal ROS  negative genitourinary   Musculoskeletal negative musculoskeletal ROS (+)   Abdominal (+)  Abdomen: soft.    Peds  Hematology negative hematology ROS (+)   Anesthesia Other Findings   Reproductive/Obstetrics                            Anesthesia Physical Anesthesia Plan  ASA: 2  Anesthesia Plan: MAC   Post-op Pain Management:    Induction: Intravenous  PONV Risk Score and Plan: 1 and Propofol infusion  Airway Management Planned: Simple Face Mask, Natural Airway and Nasal Cannula  Additional Equipment: None  Intra-op Plan:   Post-operative Plan:   Informed Consent: I have reviewed the patients History and Physical, chart, labs and discussed the procedure including the risks, benefits and alternatives for the proposed anesthesia with the patient or authorized representative who has indicated his/her understanding and acceptance.     Dental advisory given  Plan Discussed with: CRNA  Anesthesia Plan Comments: (Lab Results      Component                Value               Date                      WBC                      5.5                 04/01/2021                HGB                      13.9                04/01/2021                HCT                       41.8                04/01/2021                MCV                      82.8                04/01/2021                PLT                      227.0  04/01/2021           Lab Results      Component                Value               Date                      NA                       137                 04/01/2021                K                        3.8                 04/01/2021                CO2                      25                  04/01/2021                GLUCOSE                  84                  04/01/2021                BUN                      9                   04/01/2021                CREATININE               0.66                04/01/2021                CALCIUM                  9.4                 04/01/2021                EGFR                     107                 12/10/2020                GFRNONAA                 >60                 10/20/2018          )       Anesthesia Quick Evaluation

## 2021-04-07 ENCOUNTER — Encounter (HOSPITAL_COMMUNITY)
Admission: RE | Disposition: A | Discharge status: Home / Self Care | Payer: Self-pay | Source: Home / Self Care | Attending: Gastroenterology

## 2021-04-07 ENCOUNTER — Encounter (HOSPITAL_COMMUNITY): Payer: Self-pay | Admitting: Gastroenterology

## 2021-04-07 ENCOUNTER — Other Ambulatory Visit: Payer: Self-pay

## 2021-04-07 ENCOUNTER — Ambulatory Visit (HOSPITAL_COMMUNITY): Payer: 59 | Admitting: Anesthesiology

## 2021-04-07 ENCOUNTER — Ambulatory Visit (HOSPITAL_COMMUNITY)
Admission: RE | Admit: 2021-04-07 | Discharge: 2021-04-07 | Disposition: A | Discharge status: Home / Self Care | Payer: 59 | Attending: Gastroenterology | Admitting: Gastroenterology

## 2021-04-07 DIAGNOSIS — Z86010 Personal history of colonic polyps: Secondary | ICD-10-CM | POA: Diagnosis not present

## 2021-04-07 DIAGNOSIS — Z833 Family history of diabetes mellitus: Secondary | ICD-10-CM | POA: Insufficient documentation

## 2021-04-07 DIAGNOSIS — C180 Malignant neoplasm of cecum: Secondary | ICD-10-CM | POA: Diagnosis not present

## 2021-04-07 DIAGNOSIS — K641 Second degree hemorrhoids: Secondary | ICD-10-CM | POA: Insufficient documentation

## 2021-04-07 DIAGNOSIS — D123 Benign neoplasm of transverse colon: Secondary | ICD-10-CM | POA: Diagnosis not present

## 2021-04-07 DIAGNOSIS — Z8249 Family history of ischemic heart disease and other diseases of the circulatory system: Secondary | ICD-10-CM | POA: Insufficient documentation

## 2021-04-07 DIAGNOSIS — Z888 Allergy status to other drugs, medicaments and biological substances status: Secondary | ICD-10-CM | POA: Diagnosis not present

## 2021-04-07 DIAGNOSIS — F17210 Nicotine dependence, cigarettes, uncomplicated: Secondary | ICD-10-CM | POA: Insufficient documentation

## 2021-04-07 DIAGNOSIS — Z79899 Other long term (current) drug therapy: Secondary | ICD-10-CM | POA: Insufficient documentation

## 2021-04-07 DIAGNOSIS — D120 Benign neoplasm of cecum: Secondary | ICD-10-CM | POA: Diagnosis not present

## 2021-04-07 DIAGNOSIS — I10 Essential (primary) hypertension: Secondary | ICD-10-CM | POA: Insufficient documentation

## 2021-04-07 DIAGNOSIS — K635 Polyp of colon: Secondary | ICD-10-CM | POA: Diagnosis not present

## 2021-04-07 DIAGNOSIS — E119 Type 2 diabetes mellitus without complications: Secondary | ICD-10-CM | POA: Insufficient documentation

## 2021-04-07 DIAGNOSIS — Z7984 Long term (current) use of oral hypoglycemic drugs: Secondary | ICD-10-CM | POA: Diagnosis not present

## 2021-04-07 HISTORY — PX: SUBMUCOSAL LIFTING INJECTION: SHX6855

## 2021-04-07 HISTORY — PX: ENDOSCOPIC MUCOSAL RESECTION: SHX6839

## 2021-04-07 HISTORY — PX: COLONOSCOPY WITH PROPOFOL: SHX5780

## 2021-04-07 HISTORY — PX: HEMOSTASIS CLIP PLACEMENT: SHX6857

## 2021-04-07 LAB — GLUCOSE, CAPILLARY: Glucose-Capillary: 104 mg/dL — ABNORMAL HIGH (ref 70–99)

## 2021-04-07 SURGERY — COLONOSCOPY WITH PROPOFOL
Anesthesia: Monitor Anesthesia Care

## 2021-04-07 MED ORDER — PROPOFOL 10 MG/ML IV BOLUS
INTRAVENOUS | Status: AC
  Filled 2021-04-07: qty 40

## 2021-04-07 MED ORDER — GLYCOPYRROLATE 0.2 MG/ML IJ SOLN
INTRAMUSCULAR | Status: DC | PRN
  Administered 2021-04-07: .2 mg via INTRAVENOUS

## 2021-04-07 MED ORDER — SODIUM CHLORIDE 0.9 % IV SOLN: INTRAVENOUS | Status: DC

## 2021-04-07 MED ORDER — PROPOFOL 500 MG/50ML IV EMUL
INTRAVENOUS | Status: DC | PRN
  Administered 2021-04-07: 135 ug/kg/min via INTRAVENOUS

## 2021-04-07 MED ORDER — PHENYLEPHRINE HCL (PRESSORS) 10 MG/ML IV SOLN
INTRAVENOUS | Status: DC | PRN
  Administered 2021-04-07 (×2): 80 ug via INTRAVENOUS

## 2021-04-07 MED ORDER — ONDANSETRON HCL 4 MG/2ML IJ SOLN
INTRAMUSCULAR | Status: DC | PRN
  Administered 2021-04-07: 4 mg via INTRAVENOUS

## 2021-04-07 MED ORDER — LACTATED RINGERS IV SOLN: INTRAVENOUS | Status: DC

## 2021-04-07 MED ORDER — MIDAZOLAM HCL 5 MG/5ML IJ SOLN
INTRAMUSCULAR | Status: DC | PRN
  Administered 2021-04-07: 2 mg via INTRAVENOUS

## 2021-04-07 MED ORDER — NAPROXEN SODIUM 220 MG PO TABS: 440.0000 mg | ORAL_TABLET | Freq: Every day | ORAL | 0 refills | Status: AC | PRN

## 2021-04-07 MED ORDER — PROPOFOL 1000 MG/100ML IV EMUL
INTRAVENOUS | Status: AC
  Filled 2021-04-07: qty 200

## 2021-04-07 MED ORDER — MIDAZOLAM HCL 2 MG/2ML IJ SOLN
INTRAMUSCULAR | Status: AC
  Filled 2021-04-07: qty 2

## 2021-04-07 MED ORDER — PROPOFOL 500 MG/50ML IV EMUL
INTRAVENOUS | Status: DC | PRN
  Administered 2021-04-07: 30 mg via INTRAVENOUS

## 2021-04-07 SURGICAL SUPPLY — 22 items

## 2021-04-07 NOTE — Progress Notes (Signed)
Pt c/o pain/irritation left eye, feels like "something's in there". Flushed with normal saline, minimal relief stated. Dr Rex Kras informed

## 2021-04-07 NOTE — Progress Notes (Signed)
Dr Rex Kras assessed left eye, no new orders received.

## 2021-04-07 NOTE — Discharge Instructions (Signed)

## 2021-04-07 NOTE — Transfer of Care (Signed)
Immediate Anesthesia Transfer of Care Note  Patient: Shelby Wyatt  Procedure(s) Performed: Procedure(s): COLONOSCOPY WITH PROPOFOL (N/A) ENDOSCOPIC MUCOSAL RESECTION (N/A) SUBMUCOSAL LIFTING INJECTION HEMOSTASIS CLIP PLACEMENT  Patient Location: PACU  Anesthesia Type:MAC  Level of Consciousness:  sedated, patient cooperative and responds to stimulation  Airway & Oxygen Therapy:Patient Spontanous Breathing and Patient connected to face mask oxgen  Post-op Assessment:  Report given to PACU RN and Post -op Vital signs reviewed and stable  Post vital signs:  Reviewed and stable  Last Vitals:  Vitals:   04/07/21 0702  BP: 121/73  Pulse: 70  Resp: 10  Temp: 36.6 C  SpO2: 943%    Complications: No apparent anesthesia complications

## 2021-04-07 NOTE — Op Note (Signed)
[facility] Patient Name: Shelby Wyatt Procedure Date: 04/07/2021 MRN: [id number] Attending MD: Justice Britain , MD Date of Birth: 1965-07-23 CSN: [id number] Age: 55 Admit Type: Outpatient Procedure:                Colonoscopy Indications:              Excision of colonic polyp Providers:                Justice Britain, MD, Burtis Junes, RN, Hinton Dyer Referring MD:             [place] Candis Schatz, MD, Ladell Pier Medicines:                Monitored Anesthesia Care Complications:            No immediate complications. Estimated Blood Loss:     Estimated blood loss was minimal. Procedure:                Pre-Anesthesia Assessment:                           - Prior to the procedure, a History and Physical                            was performed, and patient medications and                            allergies were reviewed. The patient's tolerance of                            previous anesthesia was also reviewed. The risks                            and benefits of the procedure and the sedation                            options and risks were discussed with the patient.                            All questions were answered, and informed consent                            was obtained. Prior Anticoagulants: The patient has                            taken no previous anticoagulant or antiplatelet                            agents except for NSAID medication. ASA Grade                            Assessment: III - A patient with severe systemic                            disease. After reviewing the risks and benefits,  the patient was deemed in satisfactory condition to                            undergo the procedure.                           After obtaining informed consent, the colonoscope                            was passed under direct vision. Throughout the                            procedure, the patient's blood  pressure, pulse, and                            oxygen saturations were monitored continuously. The                            CF-HQ190L ([id number]) Olympus colonoscope was                            introduced through the anus and advanced to the 5                            cm into the ileum. The colonoscopy was performed                            without difficulty. The patient tolerated the                            procedure. The quality of the bowel preparation was                            adequate. The terminal ileum, ileocecal valve,                            appendiceal orifice, and rectum were photographed. Scope In: 7:53:59 AM Scope Out: 8:34:24 AM Scope Withdrawal Time: 0 hours 37 minutes 19 seconds  Total Procedure Duration: 0 hours 40 minutes 25 seconds  Findings:      The digital rectal exam findings include hemorrhoids. Pertinent       negatives include no palpable rectal lesions.      The terminal ileum and ileocecal valve appeared normal.      A 35 mm polyp was found in the cecum. The polyp was mixed lateral       spreading. There was mostly JNET2a in classification but an area on the       top of the polyp (which is also just below the ICV) had appearance of       mild ulceration and JNET3 appearance. Preparations were made for attempt       at mucosal resection. NBI imaging and White-light endoscopy was done to       demarcate the borders of the lesion. Orise gel was injected to raise the       lesion with greater than 90% showing an appropriate lift.  Piecemeal       mucosal resection using a snare was performed. Resection and retrieval       were complete. Coagulation for tissue destruction using snare tip soft       coagulation was successful to the margin. To prevent bleeding after       mucosal resection, seven hemostatic clips were successfully placed (MR       conditional). There was no bleeding at the end of the procedure.      A 6 mm polyp was found in the  transverse colon. The polyp was sessile.       The polyp was removed with a cold snare. Resection and retrieval were       complete.      Normal mucosa was found in the entire colon otherwise.      Anal papillae were hypertrophied.      Non-bleeding non-thrombosed internal hemorrhoids were found during       retroflexion, during perianal exam and during digital exam. The       hemorrhoids were Grade II (internal hemorrhoids that prolapse but reduce       spontaneously). Impression:               - Hemorrhoids found on digital rectal exam.                           - The examined portion of the ileum was normal.                           - One 35 mm polyp in the cecum (JNET2a + some JNET3                            characteristics), removed with piecemeal mucosal                            resection. Resected and retrieved. Treated with                            STSC to margin. Clips (MR conditional) were placed                            to close defect.                           - One 6 mm polyp in the transverse colon, removed                            with a cold snare. Resected and retrieved.                           - Normal mucosa in the entire examined colon                            otherwise.                           - Anal papillae were hypertrophied.                           -  Non-bleeding non-thrombosed internal hemorrhoids. Moderate Sedation:      Not Applicable - Patient had care per Anesthesia. Recommendation:           - The patient will be observed post-procedure,                            until all discharge criteria are met.                           - Discharge patient to home.                           - Patient has a contact number available for                            emergencies. The signs and symptoms of potential                            delayed complications were discussed with the                            patient. Return to normal activities  tomorrow.                            Written discharge instructions were provided to the                            patient.                           - High fiber diet.                           - Use FiberCon 1-2 tablets PO daily.                           - No aspirin, ibuprofen, naproxen, or other                            non-steroidal anti-inflammatory drugs for 2 weeks                            after polyp removal to decrease risk of                            post-interventional bleeding.                           - Continue present medications.                           - Await pathology results.                           - Repeat colonoscopy in 6-9 months for surveillance  as long as no evidence of malignancy is present.                           - The findings and recommendations were discussed                            with the patient.                           - The findings and recommendations were discussed                            with the patient's family. Procedure Code(s):        --- Professional ---                           [phone], Colonoscopy, flexible; with endoscopic                            mucosal resection                           45385, 31, Colonoscopy, flexible; with removal of                            tumor(s), polyp(s), or other lesion(s) by snare                            technique Diagnosis Code(s):        --- Professional ---                           K64.1, Second degree hemorrhoids                           K63.5, Polyp of colon                           K62.89, Other specified diseases of anus and rectum CPT copyright 2019 American Medical Association. All rights reserved. The codes documented in this report are preliminary and upon coder review may  be revised to meet current compliance requirements. Justice Britain, MD 04/07/2021 8:57:20 AM Number of Addenda: 0

## 2021-04-07 NOTE — Anesthesia Postprocedure Evaluation (Signed)
Anesthesia Post Note  Patient: Shelby Wyatt  Procedure(s) Performed: COLONOSCOPY WITH PROPOFOL ENDOSCOPIC MUCOSAL RESECTION SUBMUCOSAL LIFTING INJECTION HEMOSTASIS CLIP PLACEMENT     Patient location during evaluation: Endoscopy Anesthesia Type: MAC Level of consciousness: awake and alert Pain management: pain level controlled Vital Signs Assessment: post-procedure vital signs reviewed and stable Respiratory status: spontaneous breathing, nonlabored ventilation, respiratory function stable and patient connected to nasal cannula oxygen Cardiovascular status: stable and blood pressure returned to baseline Postop Assessment: no apparent nausea or vomiting Anesthetic complications: no   No notable events documented.  Last Vitals:  Vitals:   04/07/21 0910 04/07/21 0920  BP: (!) 145/86 (!) 153/85  Pulse: (!) 57 (!) 55  Resp: 15 15  Temp:    SpO2: 99% 94%    Last Pain:  Vitals:   04/07/21 0920  TempSrc:   PainSc: 0-No pain                 March [employer]

## 2021-04-07 NOTE — Interval H&P Note (Signed)
History and Physical Interval Note:  04/07/2021 7:41 AM  Shelby Wyatt  has presented today for surgery, with the diagnosis of large cecal polyp.  The various methods of treatment have been discussed with the patient and family. After consideration of risks, benefits and other options for treatment, the patient has consented to  Procedure(s): COLONOSCOPY WITH PROPOFOL (N/A) ENDOSCOPIC MUCOSAL RESECTION (N/A) as a surgical intervention.  The patient's history has been reviewed, patient examined, no change in status, stable for surgery.  I have reviewed the patient's chart and labs.  Questions were answered to the patient's satisfaction.     [employer]

## 2021-04-08 ENCOUNTER — Other Ambulatory Visit: Payer: Self-pay

## 2021-04-08 ENCOUNTER — Other Ambulatory Visit (HOSPITAL_COMMUNITY): Payer: Self-pay

## 2021-04-08 ENCOUNTER — Encounter: Payer: Self-pay | Admitting: Gastroenterology

## 2021-04-08 ENCOUNTER — Encounter (HOSPITAL_COMMUNITY): Payer: Self-pay | Admitting: Gastroenterology

## 2021-04-08 DIAGNOSIS — C189 Malignant neoplasm of colon, unspecified: Secondary | ICD-10-CM

## 2021-04-13 ENCOUNTER — Other Ambulatory Visit: Payer: 59

## 2021-04-13 DIAGNOSIS — C189 Malignant neoplasm of colon, unspecified: Secondary | ICD-10-CM | POA: Diagnosis not present

## 2021-04-14 LAB — CEA: CEA: 2.1 ng/mL

## 2021-04-16 ENCOUNTER — Other Ambulatory Visit: Payer: Self-pay

## 2021-04-16 ENCOUNTER — Ambulatory Visit (INDEPENDENT_AMBULATORY_CARE_PROVIDER_SITE_OTHER)
Admission: RE | Admit: 2021-04-16 | Discharge: 2021-04-16 | Disposition: A | Discharge status: Home / Self Care | Payer: 59 | Source: Ambulatory Visit | Attending: Gastroenterology | Admitting: Gastroenterology

## 2021-04-16 DIAGNOSIS — C189 Malignant neoplasm of colon, unspecified: Secondary | ICD-10-CM | POA: Diagnosis not present

## 2021-04-16 DIAGNOSIS — N852 Hypertrophy of uterus: Secondary | ICD-10-CM | POA: Diagnosis not present

## 2021-04-16 DIAGNOSIS — D259 Leiomyoma of uterus, unspecified: Secondary | ICD-10-CM | POA: Diagnosis not present

## 2021-04-16 DIAGNOSIS — K769 Liver disease, unspecified: Secondary | ICD-10-CM | POA: Diagnosis not present

## 2021-04-16 DIAGNOSIS — I700 Atherosclerosis of aorta: Secondary | ICD-10-CM | POA: Diagnosis not present

## 2021-04-16 MED ORDER — IOHEXOL 350 MG/ML SOLN
100.0000 mL | Freq: Once | INTRAVENOUS | Status: AC | PRN
  Administered 2021-04-16: 100 mL via INTRAVENOUS

## 2021-04-27 ENCOUNTER — Other Ambulatory Visit (HOSPITAL_BASED_OUTPATIENT_CLINIC_OR_DEPARTMENT_OTHER): Payer: Self-pay

## 2021-04-27 ENCOUNTER — Other Ambulatory Visit (HOSPITAL_COMMUNITY): Payer: Self-pay

## 2021-04-27 DIAGNOSIS — C182 Malignant neoplasm of ascending colon: Secondary | ICD-10-CM | POA: Diagnosis not present

## 2021-04-27 MED ORDER — METRONIDAZOLE 500 MG PO TABS
1000.0000 mg | ORAL_TABLET | Freq: Three times a day (TID) | ORAL | 0 refills | Status: DC
  Filled 2021-04-27: qty 6, 1d supply, fill #0

## 2021-04-27 MED ORDER — NEOMYCIN SULFATE 500 MG PO TABS
ORAL_TABLET | ORAL | 0 refills | Status: DC
  Filled 2021-04-27: qty 6, 1d supply, fill #0

## 2021-04-27 MED ORDER — POLYETHYLENE GLYCOL 3350 17 GM/SCOOP PO POWD
ORAL | 0 refills | Status: DC
  Filled 2021-04-27: qty 255, 1d supply, fill #0

## 2021-04-27 MED ORDER — POLYETHYLENE GLYCOL 3350 17 GM/SCOOP PO POWD
ORAL | 0 refills | Status: DC
  Filled 2021-04-27: qty 238, 1d supply, fill #0

## 2021-04-27 MED ORDER — METRONIDAZOLE 500 MG PO TABS
ORAL_TABLET | ORAL | 0 refills | Status: DC
  Filled 2021-04-27: qty 6, 1d supply, fill #0

## 2021-04-27 MED ORDER — NEOMYCIN SULFATE 500 MG PO TABS
1000.0000 mg | ORAL_TABLET | Freq: Three times a day (TID) | ORAL | 0 refills | Status: DC
  Filled 2021-04-27: qty 6, 1d supply, fill #0

## 2021-04-27 MED ORDER — BISACODYL EC 5 MG PO TBEC
DELAYED_RELEASE_TABLET | ORAL | 0 refills | Status: DC
  Filled 2021-04-27: qty 4, 1d supply, fill #0
  Filled 2021-04-27: qty 100, 1d supply, fill #0

## 2021-04-28 ENCOUNTER — Other Ambulatory Visit (HOSPITAL_COMMUNITY): Payer: Self-pay

## 2021-04-29 ENCOUNTER — Other Ambulatory Visit (HOSPITAL_COMMUNITY): Payer: Self-pay

## 2021-04-30 NOTE — Progress Notes (Addendum)
COVID swab appointment: 05/25/21  COVID Vaccine Completed: yes x2 Date COVID Vaccine completed: 09/21/19, 10/16/19 Has received booster: COVID vaccine manufacturer: Pfizer      Date of COVID positive in last 90 days: no  PCP - Karle Plumber, MD Cardiologist - n/a  Chest x-ray - CT 04/16/21 Epic EKG - 05/04/21 Epic Stress Test - n/a ECHO - n/a Cardiac Cath - n/a Pacemaker/ICD device last checked: n/a Spinal Cord Stimulator: n/a  Sleep Study - n/a CPAP -   Fasting Blood Sugar - pre DM does not check at home Checks Blood Sugar _____ times a day  Blood Thinner Instructions: n/a Aspirin Instructions: Last Dose:  Activity level: Can go up a flight of stairs and perform activities of daily living without stopping and without symptoms of chest pain or shortness of breath     Anesthesia review:   Patient denies shortness of breath, fever, cough and chest pain at PAT appointment   Patient verbalized understanding of instructions that were given to them at the PAT appointment. Patient was also instructed that they will need to review over the PAT instructions again at home before surgery.

## 2021-05-01 ENCOUNTER — Other Ambulatory Visit: Payer: Self-pay

## 2021-05-01 ENCOUNTER — Ambulatory Visit
Admission: RE | Admit: 2021-05-01 | Discharge: 2021-05-01 | Disposition: A | Discharge status: Home / Self Care | Payer: 59 | Source: Ambulatory Visit | Attending: Internal Medicine | Admitting: Internal Medicine

## 2021-05-01 DIAGNOSIS — Z1231 Encounter for screening mammogram for malignant neoplasm of breast: Secondary | ICD-10-CM

## 2021-05-01 NOTE — Patient Instructions (Addendum)
DUE TO COVID-19 ONLY ONE VISITOR IS ALLOWED TO COME WITH YOU AND STAY IN THE WAITING ROOM ONLY DURING PRE OP AND PROCEDURE.   **NO VISITORS ARE ALLOWED IN THE SHORT STAY AREA OR RECOVERY ROOM!!**  IF YOU WILL BE ADMITTED INTO THE HOSPITAL YOU ARE ALLOWED ONLY TWO SUPPORT PEOPLE DURING VISITATION HOURS ONLY (10AM -8PM)   The support person(s) may change daily. The support person(s) must pass our screening, gel in and out, and wear a mask at all times, including in the patient's room. Patients must also wear a mask when staff or their support person are in the room.  No visitors under the age of 72. Any visitor under the age of 68 must be accompanied by an adult.    COVID SWAB TESTING MUST BE COMPLETED ON:  05/25/21 **MUST PRESENT COMPLETED FORM AT TESTING SITE**    [place]  [facility] (backside of the building) Open 8am-3pm. No appointment needed. You are not required to quarantine, however you are required to wear a well-fitted mask when you are out and around people not in your household.  Hand Hygiene often Do NOT share personal items Notify your provider if you are in close contact with someone who has COVID or you develop fever 100.4 or greater, new onset of sneezing, cough, sore throat, shortness of breath or body aches.       Your procedure is scheduled on: 05/27/21   Report to [facility] Main Entrance    Report to admitting at 6:15 AM   Call this number if you have problems the morning of surgery [phone]   Follow clear liquid diet the day before surgery as instructed by you surgeons office.   May have liquids until 5:30 AM day of surgery  CLEAR LIQUID DIET  Foods Allowed                                                                     Foods Excluded  Water, Black Coffee and tea (no milk or creamer)           liquids that you cannot  Plain Jell-O in any flavor  (No red)                                    see through such as: Fruit ices (not  with fruit pulp)                                            milk, soups, orange juice              Iced Popsicles (No red)                                               All solid food  Apple juices Sports drinks like Gatorade (No red) Lightly seasoned clear broth or consume(fat free) Sugar  Sample Menu Breakfast                                Lunch                                     Supper Cranberry juice                    Beef broth                            Chicken broth Jell-O                                     Grape juice                           Apple juice Coffee or tea                        Jell-O                                      Popsicle                                                Coffee or tea                        Coffee or tea    Oral Hygiene is also important to reduce your risk of infection.                                    Remember - BRUSH YOUR TEETH THE MORNING OF SURGERY WITH YOUR REGULAR TOOTHPASTE   Do NOT smoke after Midnight   Take these medicines the morning of surgery with A SIP OF WATER: none  DO NOT TAKE ANY ORAL DIABETIC MEDICATIONS DAY OF YOUR SURGERY  How to Manage Your Diabetes Before and After Surgery  Why is it important to control my blood sugar before and after surgery? Improving blood sugar levels before and after surgery helps healing and can limit problems. A way of improving blood sugar control is eating a healthy diet by:  Eating less sugar and carbohydrates  Increasing activity/exercise  Talking with your doctor about reaching your blood sugar goals High blood sugars (greater than 180 mg/dL) can raise your risk of infections and slow your recovery, so you will need to focus on controlling your diabetes during the weeks before surgery. Make sure that the doctor who takes care of your diabetes knows about your planned surgery including the date and location.  How do I manage my blood sugar before  surgery? Check your blood sugar at least 4 times a day, starting 2 days before surgery, to make sure that the  level is not too high or low. Check your blood sugar the morning of your surgery when you wake up and every 2 hours until you get to the Short Stay unit. If your blood sugar is less than 70 mg/dL, you will need to treat for low blood sugar: Do not take insulin. Treat a low blood sugar (less than 70 mg/dL) with  cup of clear juice (cranberry or apple), 4 glucose tablets, OR glucose gel. Recheck blood sugar in 15 minutes after treatment (to make sure it is greater than 70 mg/dL). If your blood sugar is not greater than 70 mg/dL on recheck, call [phone] for further instructions. Report your blood sugar to the short stay nurse when you get to Short Stay.  If you are admitted to the hospital after surgery: Your blood sugar will be checked by the staff and you will probably be given insulin after surgery (instead of oral diabetes medicines) to make sure you have good blood sugar levels. The goal for blood sugar control after surgery is 80-180 mg/dL.   WHAT DO I DO ABOUT MY DIABETES MEDICATION?  Do not take oral diabetes medicines (pills) the morning of surgery.  THE DAY BEFORE SURGERY, take Metformin as prescribed       THE MORNING OF SURGERY, do not take Metformin   Reviewed and Endorsed by [facility] Patient Education Committee, August 2015                               You may not have any metal on your body including hair pins, jewelry, and body piercing             Do not wear make-up, lotions, powders, perfumes, or deodorant  Do not wear nail polish including gel and S&S, artificial/acrylic nails, or any other type of covering on natural nails including finger and toenails. If you have artificial nails, gel coating, etc. that needs to be removed by a nail salon please have this removed prior to surgery or surgery may need to be canceled/ delayed if the surgeon/ anesthesia  feels like they are unable to be safely monitored.   Do not shave  48 hours prior to surgery.    Do not bring valuables to the hospital. [place].   Bring small overnight bag day of surgery.   Special Instructions: Bring a copy of your healthcare power of attorney and living will documents         the day of surgery if you haven't scanned them before.              Please read over the following fact sheets you were given: IF YOU HAVE QUESTIONS ABOUT YOUR PRE-OP INSTRUCTIONS PLEASE CALL Agency - Preparing for Surgery Before surgery, you can play an important role.  Because skin is not sterile, your skin needs to be as free of germs as possible.  You can reduce the number of germs on your skin by washing with CHG (chlorahexidine gluconate) soap before surgery.  CHG is an antiseptic cleaner which kills germs and bonds with the skin to continue killing germs even after washing. Please DO NOT use if you have an allergy to CHG or antibacterial soaps.  If your skin becomes reddened/irritated stop using the CHG and inform your nurse when  you arrive at Short Stay. Do not shave (including legs and underarms) for at least 48 hours prior to the first CHG shower.  You may shave your face/neck.  Please follow these instructions carefully:  1.  Shower with CHG Soap the night before surgery and the  morning of surgery.  2.  If you choose to wash your hair, wash your hair first as usual with your normal  shampoo.  3.  After you shampoo, rinse your hair and body thoroughly to remove the shampoo.                             4.  Use CHG as you would any other liquid soap.  You can apply chg directly to the skin and wash.  Gently with a scrungie or clean washcloth.  5.  Apply the CHG Soap to your body ONLY FROM THE NECK DOWN.   Do   not use on face/ open                           Wound or open sores. Avoid contact with eyes, ears mouth  and   genitals (private parts).                       Wash face,  Genitals (private parts) with your normal soap.             6.  Wash thoroughly, paying special attention to the area where your    surgery  will be performed.  7.  Thoroughly rinse your body with warm water from the neck down.  8.  DO NOT shower/wash with your normal soap after using and rinsing off the CHG Soap.                9.  Pat yourself dry with a clean towel.            10.  Wear clean pajamas.            11.  Place clean sheets on your bed the night of your first shower and do not  sleep with pets. Day of Surgery : Do not apply any lotions/deodorants the morning of surgery.  Please wear clean clothes to the hospital/surgery center.  FAILURE TO FOLLOW THESE INSTRUCTIONS MAY RESULT IN THE CANCELLATION OF YOUR SURGERY  PATIENT SIGNATURE_________________________________  NURSE SIGNATURE__________________________________  ________________________________________________________________________  WHAT IS A BLOOD TRANSFUSION? Blood Transfusion Information  A transfusion is the replacement of blood or some of its parts. Blood is made up of multiple cells which provide different functions. Red blood cells carry oxygen and are used for blood loss replacement. White blood cells fight against infection. Platelets control bleeding. Plasma helps clot blood. Other blood products are available for specialized needs, such as hemophilia or other clotting disorders. BEFORE THE TRANSFUSION  Who gives blood for transfusions?  Healthy volunteers who are fully evaluated to make sure their blood is safe. This is blood bank blood. Transfusion therapy is the safest it has ever been in the practice of medicine. Before blood is taken from a donor, a complete history is taken to make sure that person has no history of diseases nor engages in risky social behavior (examples are intravenous drug use or sexual activity with multiple partners).  The donor's travel history is screened to minimize risk of transmitting infections, such as malaria. The donated blood  is tested for signs of infectious diseases, such as HIV and hepatitis. The blood is then tested to be sure it is compatible with you in order to minimize the chance of a transfusion reaction. If you or a relative donates blood, this is often done in anticipation of surgery and is not appropriate for emergency situations. It takes many days to process the donated blood. RISKS AND COMPLICATIONS Although transfusion therapy is very safe and saves many lives, the main dangers of transfusion include:  Getting an infectious disease. Developing a transfusion reaction. This is an allergic reaction to something in the blood you were given. Every precaution is taken to prevent this. The decision to have a blood transfusion has been considered carefully by your caregiver before blood is given. Blood is not given unless the benefits outweigh the risks. AFTER THE TRANSFUSION Right after receiving a blood transfusion, you will usually feel much better and more energetic. This is especially true if your red blood cells have gotten low (anemic). The transfusion raises the level of the red blood cells which carry oxygen, and this usually causes an energy increase. The nurse administering the transfusion will monitor you carefully for complications. HOME CARE INSTRUCTIONS  No special instructions are needed after a transfusion. You may find your energy is better. Speak with your caregiver about any limitations on activity for underlying diseases you may have. SEEK MEDICAL CARE IF:  Your condition is not improving after your transfusion. You develop redness or irritation at the intravenous (IV) site. SEEK IMMEDIATE MEDICAL CARE IF:  Any of the following symptoms occur over the next 12 hours: Shaking chills. You have a temperature by mouth above 102 F (38.9 C), not controlled by medicine. Chest,  back, or muscle pain. People around you feel you are not acting correctly or are confused. Shortness of breath or difficulty breathing. Dizziness and fainting. You get a rash or develop hives. You have a decrease in urine output. Your urine turns a dark color or changes to pink, red, or brown. Any of the following symptoms occur over the next 10 days: You have a temperature by mouth above 102 F (38.9 C), not controlled by medicine. Shortness of breath. Weakness after normal activity. The white part of the eye turns yellow (jaundice). You have a decrease in the amount of urine or are urinating less often. Your urine turns a dark color or changes to pink, red, or brown. Document Released: 05/28/2000 Document Revised: 08/23/2011 Document Reviewed: 01/15/2008 [facility] Patient Information 2014 [place], [place].  _______________________________________________________________________

## 2021-05-01 NOTE — Progress Notes (Signed)
Please place PAT orders for appointment scheduled 05/04/21.

## 2021-05-04 ENCOUNTER — Other Ambulatory Visit: Payer: Self-pay

## 2021-05-04 ENCOUNTER — Encounter (HOSPITAL_COMMUNITY)
Admission: RE | Admit: 2021-05-04 | Discharge: 2021-05-04 | Disposition: A | Discharge status: Home / Self Care | Payer: 59 | Source: Ambulatory Visit | Attending: Surgery | Admitting: Surgery

## 2021-05-04 ENCOUNTER — Encounter (HOSPITAL_COMMUNITY): Payer: Self-pay

## 2021-05-04 VITALS — BP 139/85 | HR 77 | Temp 98.3°F | Resp 14 | Ht 62.0 in | Wt 180.4 lb

## 2021-05-04 DIAGNOSIS — I10 Essential (primary) hypertension: Secondary | ICD-10-CM | POA: Diagnosis not present

## 2021-05-04 DIAGNOSIS — Z01818 Encounter for other preprocedural examination: Secondary | ICD-10-CM | POA: Diagnosis not present

## 2021-05-04 DIAGNOSIS — R7303 Prediabetes: Secondary | ICD-10-CM | POA: Diagnosis not present

## 2021-05-04 HISTORY — DX: Prediabetes: R73.03

## 2021-05-04 LAB — BASIC METABOLIC PANEL
Anion gap: 6 (ref 5–15)
BUN: 12 mg/dL (ref 6–20)
CO2: 25 mmol/L (ref 22–32)
Calcium: 9 mg/dL (ref 8.9–10.3)
Chloride: 106 mmol/L (ref 98–111)
Creatinine, Ser: 0.56 mg/dL (ref 0.44–1.00)
GFR, Estimated: >60 mL/min (ref >60)
Glucose, Bld: 141 mg/dL — ABNORMAL HIGH (ref 70–99)
Potassium: 4.1 mmol/L (ref 3.5–5.1)
Sodium: 137 mmol/L (ref 135–145)

## 2021-05-04 LAB — CBC
HCT: 41.5 % (ref 36.0–46.0)
Hemoglobin: 13.4 g/dL (ref 12.0–15.0)
MCH: 27.3 pg (ref 26.0–34.0)
MCHC: 32.3 g/dL (ref 30.0–36.0)
MCV: 84.7 fL (ref 80.0–100.0)
Platelets: 258 10*3/uL (ref 150–400)
RBC: 4.9 MIL/uL (ref 3.87–5.11)
RDW: 16 % — ABNORMAL HIGH (ref 11.5–15.5)
WBC: 6.6 10*3/uL (ref 4.0–10.5)
nRBC: 0 % (ref 0.0–0.2)

## 2021-05-04 LAB — HEMOGLOBIN A1C
Hgb A1c MFr Bld: 5.8 % — ABNORMAL HIGH (ref 4.8–5.6)
Mean Plasma Glucose: 119.76 mg/dL

## 2021-05-04 LAB — GLUCOSE, CAPILLARY: Glucose-Capillary: 166 mg/dL — ABNORMAL HIGH (ref 70–99)

## 2021-05-06 ENCOUNTER — Other Ambulatory Visit (HOSPITAL_COMMUNITY): Payer: Self-pay

## 2021-05-06 ENCOUNTER — Other Ambulatory Visit: Payer: Self-pay | Admitting: Internal Medicine

## 2021-05-06 DIAGNOSIS — R7303 Prediabetes: Secondary | ICD-10-CM

## 2021-05-06 MED ORDER — METFORMIN HCL 500 MG PO TABS
500.0000 mg | ORAL_TABLET | Freq: Two times a day (BID) | ORAL | 0 refills | Status: DC
  Filled 2021-05-06: qty 180, 90d supply, fill #0

## 2021-05-06 NOTE — Telephone Encounter (Signed)
Requested Prescriptions  Pending Prescriptions Disp Refills  . metFORMIN (GLUCOPHAGE) 500 MG tablet 30 tablet 2    Sig: Take 1 tablet (500 mg total) by mouth 2 (two) times daily with a meal.     Endocrinology:  Diabetes - Biguanides Failed - 05/06/2021 12:35 PM      Failed - AA eGFR in normal range and within 360 days    GFR calc Af Amer  Date Value Ref Range Status  10/20/2018 >60 >60 mL/min Final   GFR, Estimated  Date Value Ref Range Status  05/04/2021 >60 >60 mL/min Final    Comment:    (NOTE) Calculated using the CKD-EPI Creatinine Equation (2021)    GFR  Date Value Ref Range Status  04/01/2021 100.40 >60.00 mL/min Final    Comment:    Calculated using the CKD-EPI Creatinine Equation (2021)   eGFR  Date Value Ref Range Status  12/10/2020 107 >59 mL/min/1.73 Final         Passed - Cr in normal range and within 360 days    Creatinine, Ser  Date Value Ref Range Status  05/04/2021 0.56 0.44 - 1.00 mg/dL Final         Passed - HBA1C is between 0 and 7.9 and within 180 days    Hgb A1c MFr Bld  Date Value Ref Range Status  05/04/2021 5.8 (H) 4.8 - 5.6 % Final    Comment:    (NOTE) Pre diabetes:          5.7%-6.4%  Diabetes:              >6.4%  Glycemic control for   <7.0% adults with diabetes          Passed - Valid encounter within last 6 months    Recent Outpatient Visits          2 months ago Essential hypertension   [place], MD   5 months ago Encounter to establish care   [place] Ladell Pier, MD

## 2021-05-06 NOTE — Telephone Encounter (Signed)
Medication Refill - Medication:  metFORMIN (GLUCOPHAGE) 500 MG tablet  amLODipine (NORVASC) 5 MG tablet    Has the patient contacted their pharmacy? Yes.    Preferred Pharmacy (with phone number or street name):  [facility] Phone:  [phone]  Fax:  [phone]     Has the patient been seen for an appointment in the last year OR does the patient have an upcoming appointment? Yes.    Agent: Please be advised that RX refills may take up to 3 business days. We ask that you follow-up with your pharmacy.

## 2021-05-08 ENCOUNTER — Other Ambulatory Visit: Payer: Self-pay | Admitting: Internal Medicine

## 2021-05-08 ENCOUNTER — Other Ambulatory Visit (HOSPITAL_COMMUNITY): Payer: Self-pay

## 2021-05-08 DIAGNOSIS — I10 Essential (primary) hypertension: Secondary | ICD-10-CM

## 2021-05-08 MED ORDER — AMLODIPINE BESYLATE 5 MG PO TABS
5.0000 mg | ORAL_TABLET | Freq: Every day | ORAL | 1 refills | Status: DC
  Filled 2021-05-08: qty 30, 30d supply, fill #0
  Filled 2021-07-01: qty 30, 30d supply, fill #1

## 2021-05-08 NOTE — Addendum Note (Signed)
Addended by: Matilde Sprang on: 05/08/2021 03:16 PM   Modules accepted: Orders

## 2021-05-08 NOTE — Telephone Encounter (Signed)
Requested Prescriptions  Pending Prescriptions Disp Refills  . amLODipine (NORVASC) 5 MG tablet 30 tablet 1    Sig: Take 1 tablet (5 mg total) by mouth daily. OFFICE VISIT NEEDED FOR ADDITIONAL REFILLS     Cardiovascular:  Calcium Channel Blockers Passed - 05/08/2021  3:16 PM      Passed - Last BP in normal range    BP Readings from Last 1 Encounters:  05/04/21 139/85         Passed - Valid encounter within last 6 months    Recent Outpatient Visits          2 months ago Essential hypertension   [place], Dalbert Batman, MD   5 months ago Encounter to establish care   [place] Ladell Pier, MD             Signed Prescriptions Disp Refills   metFORMIN (GLUCOPHAGE) 500 MG tablet 180 tablet 0    Sig: Take 1 tablet (500 mg total) by mouth 2 (two) times daily with a meal.     Endocrinology:  Diabetes - Biguanides Failed - 05/06/2021 12:35 PM      Failed - AA eGFR in normal range and within 360 days    GFR calc Af Amer  Date Value Ref Range Status  10/20/2018 >60 >60 mL/min Final   GFR, Estimated  Date Value Ref Range Status  05/04/2021 >60 >60 mL/min Final    Comment:    (NOTE) Calculated using the CKD-EPI Creatinine Equation (2021)    GFR  Date Value Ref Range Status  04/01/2021 100.40 >60.00 mL/min Final    Comment:    Calculated using the CKD-EPI Creatinine Equation (2021)   eGFR  Date Value Ref Range Status  12/10/2020 107 >59 mL/min/1.73 Final         Passed - Cr in normal range and within 360 days    Creatinine, Ser  Date Value Ref Range Status  05/04/2021 0.56 0.44 - 1.00 mg/dL Final         Passed - HBA1C is between 0 and 7.9 and within 180 days    Hgb A1c MFr Bld  Date Value Ref Range Status  05/04/2021 5.8 (H) 4.8 - 5.6 % Final    Comment:    (NOTE) Pre diabetes:          5.7%-6.4%  Diabetes:              >6.4%  Glycemic control for   <7.0% adults with diabetes           Passed - Valid encounter within last 6 months    Recent Outpatient Visits          2 months ago Essential hypertension   [place] Ladell Pier, MD   5 months ago Encounter to establish care   [place] Ladell Pier, MD

## 2021-05-08 NOTE — Telephone Encounter (Signed)
Medication Refill - Medication: valsartan (DIOVAN) 40 MG tablet  Has the patient contacted their pharmacy? No, patient would rather send in request through her PCP    Preferred Pharmacy (with phone number or street name):  [facility] Phone:  [phone]  Fax:  [phone]       Has the patient been seen for an appointment in the last year OR does the patient have an upcoming appointment? Yes.    Agent: Please be advised that RX refills may take up to 3 business days. We ask that you follow-up with your pharmacy.

## 2021-05-10 NOTE — Telephone Encounter (Signed)
Too soon- 03/05/21 #90 [phone] RF

## 2021-05-14 DIAGNOSIS — H53022 Refractive amblyopia, left eye: Secondary | ICD-10-CM | POA: Diagnosis not present

## 2021-05-14 DIAGNOSIS — H2513 Age-related nuclear cataract, bilateral: Secondary | ICD-10-CM | POA: Diagnosis not present

## 2021-05-14 DIAGNOSIS — H4423 Degenerative myopia, bilateral: Secondary | ICD-10-CM | POA: Diagnosis not present

## 2021-05-14 DIAGNOSIS — H31013 Macula scars of posterior pole (postinflammatory) (post-traumatic), bilateral: Secondary | ICD-10-CM | POA: Diagnosis not present

## 2021-05-14 DIAGNOSIS — H40013 Open angle with borderline findings, low risk, bilateral: Secondary | ICD-10-CM | POA: Diagnosis not present

## 2021-05-14 LAB — HM DIABETES EYE EXAM

## 2021-05-18 ENCOUNTER — Encounter: Payer: Self-pay | Admitting: Dietician

## 2021-05-18 ENCOUNTER — Encounter: Payer: 59 | Attending: Internal Medicine | Admitting: Dietician

## 2021-05-18 ENCOUNTER — Other Ambulatory Visit: Payer: Self-pay

## 2021-05-18 DIAGNOSIS — R7303 Prediabetes: Secondary | ICD-10-CM | POA: Diagnosis not present

## 2021-05-18 NOTE — Patient Instructions (Signed)
Increase your water intake as close to 64 oz a day as you can.  Use your "Low Fiber" handout for food ideas as you are moved from a liquid diet into a low fiber, soft cooked diet.  Congratulations on improving your A1c!

## 2021-05-18 NOTE — Progress Notes (Signed)
Medical Nutrition Therapy  Appointment Start time:  1640  Appointment End time:  2025  Primary concerns today: Prediabetes  Referral diagnosis: R73.03 - Prediabetes Preferred learning style: No preference indicated Learning readiness: Ready   NUTRITION ASSESSMENT   Anthropometrics  Ht: 5'2" Wt: 188.1 lbs Body mass index is 34.4 kg/m.   Clinical Medical Hx: HTN, Prediabetes, Tobacco use Medications: Lisinopril, metformin, Valsartan, Nicoderm patch Labs: NEW: A1c - 5.8 (05/04/2021) Notable Signs/Symptoms: N/A   Lifestyle & Dietary Hx Pt continues to take metformin with no side effects. Pt had a polyp removed during a colonoscopy that turned out to be cancerous. Pt will be getting the ascending portion of their colon removed in a week.  Pt reports cycling with their tobacco use, states the smell of tobacco is starting to stink to them. Pt reports moderate stress level, not any worse than before. Pt is still eating their salads, but only drinking around 16 oz of water a day. Pt has increased their walking, will walk 10 minutes after breakfast most days of the week. Pt is still walking their dog at home as well.   Estimated daily fluid intake: 50-60 oz Supplements: Centrum silver women's One-A-Day Sleep: Not great, has difficulty falling asleep occasionally Stress / self-care: Marital conflicts Current average weekly physical activity: ADLs, walks dog, dances   24-Hr Dietary Recall First Meal: Grits, cheddar cheese, 2 boiled eggs, 3 pieces bacon, biscuit Snack: none Second Meal: Ribs, mashed potatoes, gravy, french fries, cornbread, 1/2 salad Snack: none Third Meal:  Snack: none Beverages: water, ginger ale   NUTRITION DIAGNOSIS  NB-1.1 Food and nutrition-related knowledge deficit As related to prediabetes.  As evidenced by A1c of 6.0, skipping lunch, and self-reported over consumption at meals.   NUTRITION INTERVENTION  Nutrition education (E-1) on the following  topics:  Educated patient on the pathophysiology of diabetes. This includes why our bodies need circulating blood sugar, the relationship between insulin and blood sugar, and the results of insulin resistance and/or pancreatic insufficiency on the development of diabetes. Educated patient on factors that contribute to elevation of blood sugars, such as stress, illness, injury,and food choices. Discussed the role that physical activity plays in lowering blood sugar. Educate patient on the three main macronutrients. Protein, fats, and carbohydrates. Discussed how each of these macronutrients affect blood sugar levels, especially carbohydrate, and the importance of eating a consistent amount of carbohydrate throughout the day.   Handouts Provided Include  Balanced Plate Balanced Plate Food List NEW: Fiber-Restricted (13 grams) Nutrition Therapy Nutrition Care Manual  Learning Style & Readiness for Change Teaching method utilized: Visual & Auditory  Demonstrated degree of understanding via: Teach Back  Barriers to learning/adherence to lifestyle change: None  Goals Established by Pt Increase your water intake as close to 64 oz a day as you can. Use your "Low Fiber" handout for food ideas as you are moved from a liquid diet into a low fiber, soft cooked diet. Congratulations on improving your A1c!   MONITORING & EVALUATION Dietary intake, weekly physical activity, and meal frequency in 3 months.  Next Steps  Patient is to follow up with RD.

## 2021-05-21 ENCOUNTER — Other Ambulatory Visit: Payer: Self-pay | Admitting: Internal Medicine

## 2021-05-21 DIAGNOSIS — I10 Essential (primary) hypertension: Secondary | ICD-10-CM

## 2021-05-21 NOTE — Telephone Encounter (Signed)
Medication Refill - Medication: valsartan (DIOVAN) 40 MG tablet  Pt will be out of work until January 18th because she has surgery on December 14th. Pt will run out she says   Has the patient contacted their pharmacy? Yes.   (Agent: If no, request that the patient contact the pharmacy for the refill. If patient does not wish to contact the pharmacy document the reason why and proceed with request.) (Agent: If yes, when and what did the pharmacy advise?)  Preferred Pharmacy (with phone number or street name):  [facility]  [address] [place] [address]  Phone: [phone] Fax: [phone]   Has the patient been seen for an appointment in the last year OR does the patient have an upcoming appointment? Yes.    Agent: Please be advised that RX refills may take up to 3 business days. We ask that you follow-up with your pharmacy.

## 2021-05-25 ENCOUNTER — Other Ambulatory Visit (HOSPITAL_COMMUNITY): Payer: Self-pay

## 2021-05-25 ENCOUNTER — Other Ambulatory Visit: Payer: Self-pay | Admitting: Surgery

## 2021-05-26 LAB — SARS CORONAVIRUS 2 (TAT 6-24 HRS): SARS Coronavirus 2: NEGATIVE

## 2021-05-26 NOTE — Progress Notes (Signed)
[facility] for orders for surgery on 05/27/21. Spoke with Abigail Butts in Triage.

## 2021-05-26 NOTE — Anesthesia Preprocedure Evaluation (Addendum)
Anesthesia Evaluation  Patient identified by MRN, date of birth, ID band Patient awake    Reviewed: Allergy & Precautions, NPO status , Patient's Chart, lab work & pertinent test results  History of Anesthesia Complications Negative for: history of anesthetic complications  Airway Mallampati: II  TM Distance: >3 FB Neck ROM: Full    Dental  (+) Dental Advisory Given, Missing, Loose,    Pulmonary neg pulmonary ROS, Patient abstained from smoking., former smoker,    Pulmonary exam normal        Cardiovascular hypertension, Pt. on medications Normal cardiovascular exam     Neuro/Psych negative neurological ROS     GI/Hepatic Neg liver ROS, Colon cancer   Endo/Other  diabetes (prediabetes), Oral Hypoglycemic Agents  Renal/GU negative Renal ROS  negative genitourinary   Musculoskeletal negative musculoskeletal ROS (+)   Abdominal   Peds  Hematology negative hematology ROS (+)   Anesthesia Other Findings   Reproductive/Obstetrics                          Anesthesia Physical Anesthesia Plan  ASA: 2  Anesthesia Plan: General   Post-op Pain Management: Tylenol PO (pre-op), Toradol IV (intra-op) and Lidocaine infusion   Induction: Intravenous  PONV Risk Score and Plan: 3 and Ondansetron, Dexamethasone, Treatment may vary due to age or medical condition and Midazolam  Airway Management Planned: Oral ETT  Additional Equipment: None  Intra-op Plan:   Post-operative Plan: Extubation in OR  Informed Consent: I have reviewed the patients History and Physical, chart, labs and discussed the procedure including the risks, benefits and alternatives for the proposed anesthesia with the patient or authorized representative who has indicated his/her understanding and acceptance.     Dental advisory given  Plan Discussed with:   Anesthesia Plan Comments:        Anesthesia Quick  Evaluation

## 2021-05-27 ENCOUNTER — Encounter: Payer: Self-pay | Admitting: Internal Medicine

## 2021-05-27 ENCOUNTER — Inpatient Hospital Stay (HOSPITAL_COMMUNITY): Payer: 59 | Admitting: Anesthesiology

## 2021-05-27 ENCOUNTER — Other Ambulatory Visit: Payer: Self-pay

## 2021-05-27 ENCOUNTER — Encounter (HOSPITAL_COMMUNITY)
Admission: RE | Disposition: A | Discharge status: Home / Self Care | Payer: Self-pay | Source: Ambulatory Visit | Attending: Surgery

## 2021-05-27 ENCOUNTER — Encounter (HOSPITAL_COMMUNITY): Payer: Self-pay | Admitting: Surgery

## 2021-05-27 ENCOUNTER — Inpatient Hospital Stay (HOSPITAL_COMMUNITY)
Admission: RE | Admit: 2021-05-27 | Discharge: 2021-06-01 | DRG: 330 | Disposition: A | Discharge status: Home / Self Care | Payer: 59 | Source: Ambulatory Visit | Attending: Surgery | Admitting: Surgery

## 2021-05-27 DIAGNOSIS — C189 Malignant neoplasm of colon, unspecified: Secondary | ICD-10-CM | POA: Diagnosis present

## 2021-05-27 DIAGNOSIS — E119 Type 2 diabetes mellitus without complications: Secondary | ICD-10-CM | POA: Diagnosis not present

## 2021-05-27 DIAGNOSIS — I10 Essential (primary) hypertension: Secondary | ICD-10-CM | POA: Diagnosis not present

## 2021-05-27 DIAGNOSIS — H409 Unspecified glaucoma: Secondary | ICD-10-CM | POA: Diagnosis present

## 2021-05-27 DIAGNOSIS — R110 Nausea: Secondary | ICD-10-CM | POA: Diagnosis not present

## 2021-05-27 DIAGNOSIS — C180 Malignant neoplasm of cecum: Secondary | ICD-10-CM | POA: Diagnosis not present

## 2021-05-27 DIAGNOSIS — D123 Benign neoplasm of transverse colon: Secondary | ICD-10-CM | POA: Diagnosis not present

## 2021-05-27 DIAGNOSIS — R933 Abnormal findings on diagnostic imaging of other parts of digestive tract: Secondary | ICD-10-CM

## 2021-05-27 DIAGNOSIS — Z87891 Personal history of nicotine dependence: Secondary | ICD-10-CM

## 2021-05-27 DIAGNOSIS — Z888 Allergy status to other drugs, medicaments and biological substances status: Secondary | ICD-10-CM | POA: Diagnosis not present

## 2021-05-27 DIAGNOSIS — K6289 Other specified diseases of anus and rectum: Secondary | ICD-10-CM | POA: Diagnosis not present

## 2021-05-27 DIAGNOSIS — K567 Ileus, unspecified: Secondary | ICD-10-CM | POA: Diagnosis not present

## 2021-05-27 DIAGNOSIS — H40009 Preglaucoma, unspecified, unspecified eye: Secondary | ICD-10-CM | POA: Insufficient documentation

## 2021-05-27 DIAGNOSIS — R109 Unspecified abdominal pain: Secondary | ICD-10-CM | POA: Diagnosis not present

## 2021-05-27 DIAGNOSIS — C182 Malignant neoplasm of ascending colon: Secondary | ICD-10-CM | POA: Diagnosis not present

## 2021-05-27 DIAGNOSIS — Z01818 Encounter for other preprocedural examination: Secondary | ICD-10-CM

## 2021-05-27 DIAGNOSIS — Z9889 Other specified postprocedural states: Secondary | ICD-10-CM

## 2021-05-27 DIAGNOSIS — R112 Nausea with vomiting, unspecified: Secondary | ICD-10-CM

## 2021-05-27 HISTORY — PX: LAPAROSCOPIC RIGHT HEMI COLECTOMY: SHX5926

## 2021-05-27 LAB — GLUCOSE, CAPILLARY
Glucose-Capillary: 131 mg/dL — ABNORMAL HIGH (ref 70–99)
Glucose-Capillary: 185 mg/dL — ABNORMAL HIGH (ref 70–99)
Glucose-Capillary: 131 mg/dL — ABNORMAL HIGH (ref 70–99)
Glucose-Capillary: 173 mg/dL — ABNORMAL HIGH (ref 70–99)

## 2021-05-27 LAB — HCG, SERUM, QUALITATIVE: Preg, Serum: NEGATIVE

## 2021-05-27 LAB — ABO/RH: ABO/RH(D): B POS

## 2021-05-27 SURGERY — LAPAROSCOPIC RIGHT HEMI COLECTOMY
Anesthesia: General | Laterality: Right

## 2021-05-27 MED ORDER — ORAL CARE MOUTH RINSE: 15.0000 mL | Freq: Once | OROMUCOSAL | Status: AC

## 2021-05-27 MED ORDER — FENTANYL CITRATE (PF) 100 MCG/2ML IJ SOLN
INTRAMUSCULAR | Status: AC
  Filled 2021-05-27: qty 2

## 2021-05-27 MED ORDER — ONDANSETRON HCL 4 MG/2ML IJ SOLN
4.0000 mg | Freq: Four times a day (QID) | INTRAMUSCULAR | Status: DC | PRN
  Administered 2021-05-29 – 2021-05-30 (×2): 4 mg via INTRAVENOUS
  Filled 2021-05-27 (×2): qty 2

## 2021-05-27 MED ORDER — MIDAZOLAM HCL 2 MG/2ML IJ SOLN
INTRAMUSCULAR | Status: AC
  Filled 2021-05-27: qty 2

## 2021-05-27 MED ORDER — SUGAMMADEX SODIUM 200 MG/2ML IV SOLN
INTRAVENOUS | Status: DC | PRN
  Administered 2021-05-27: 170 mg via INTRAVENOUS

## 2021-05-27 MED ORDER — IBUPROFEN 400 MG PO TABS: 600.0000 mg | ORAL_TABLET | Freq: Four times a day (QID) | ORAL | Status: DC | PRN

## 2021-05-27 MED ORDER — ONDANSETRON HCL 4 MG PO TABS: 4.0000 mg | ORAL_TABLET | Freq: Four times a day (QID) | ORAL | Status: DC | PRN

## 2021-05-27 MED ORDER — FENTANYL CITRATE PF 50 MCG/ML IJ SOSY: 25.0000 ug | PREFILLED_SYRINGE | INTRAMUSCULAR | Status: DC | PRN

## 2021-05-27 MED ORDER — BUPIVACAINE-EPINEPHRINE (PF) 0.25% -1:200000 IJ SOLN
INTRAMUSCULAR | Status: AC
  Filled 2021-05-27: qty 30

## 2021-05-27 MED ORDER — MIDAZOLAM HCL 5 MG/5ML IJ SOLN
INTRAMUSCULAR | Status: DC | PRN
  Administered 2021-05-27: 2 mg via INTRAVENOUS

## 2021-05-27 MED ORDER — AMISULPRIDE (ANTIEMETIC) 5 MG/2ML IV SOLN: 10.0000 mg | Freq: Once | INTRAVENOUS | Status: DC | PRN

## 2021-05-27 MED ORDER — SODIUM CHLORIDE 0.9 % IV SOLN
2.0000 g | Freq: Once | INTRAVENOUS | Status: AC
  Administered 2021-05-27: 09:00:00 2 g via INTRAVENOUS

## 2021-05-27 MED ORDER — PHENYLEPHRINE HCL (PRESSORS) 10 MG/ML IV SOLN
INTRAVENOUS | Status: AC
  Filled 2021-05-27: qty 1

## 2021-05-27 MED ORDER — ONDANSETRON HCL 4 MG/2ML IJ SOLN
4.0000 mg | Freq: Once | INTRAMUSCULAR | Status: AC | PRN
  Administered 2021-05-27: 12:00:00 4 mg via INTRAVENOUS

## 2021-05-27 MED ORDER — TRAMADOL HCL 50 MG PO TABS
50.0000 mg | ORAL_TABLET | Freq: Four times a day (QID) | ORAL | Status: DC | PRN
  Administered 2021-05-27 – 2021-05-29 (×5): 50 mg via ORAL
  Filled 2021-05-27 (×5): qty 1

## 2021-05-27 MED ORDER — SIMETHICONE 80 MG PO CHEW
40.0000 mg | CHEWABLE_TABLET | Freq: Four times a day (QID) | ORAL | Status: DC | PRN
  Administered 2021-05-29: 40 mg via ORAL
  Filled 2021-05-27: qty 1

## 2021-05-27 MED ORDER — ALUM & MAG HYDROXIDE-SIMETH 200-200-20 MG/5ML PO SUSP: 30.0000 mL | Freq: Four times a day (QID) | ORAL | Status: DC | PRN

## 2021-05-27 MED ORDER — DEXAMETHASONE SODIUM PHOSPHATE 10 MG/ML IJ SOLN
INTRAMUSCULAR | Status: DC | PRN
  Administered 2021-05-27: 6 mg via INTRAVENOUS

## 2021-05-27 MED ORDER — HYDRALAZINE HCL 20 MG/ML IJ SOLN: 10.0000 mg | INTRAMUSCULAR | Status: DC | PRN

## 2021-05-27 MED ORDER — ALVIMOPAN 12 MG PO CAPS: 12.0000 mg | ORAL_CAPSULE | Freq: Two times a day (BID) | ORAL | Status: DC

## 2021-05-27 MED ORDER — ROCURONIUM BROMIDE 10 MG/ML (PF) SYRINGE
PREFILLED_SYRINGE | INTRAVENOUS | Status: DC | PRN
  Administered 2021-05-27: 60 mg via INTRAVENOUS

## 2021-05-27 MED ORDER — LACTATED RINGERS IR SOLN
Status: DC | PRN
  Administered 2021-05-27: 1000 mL

## 2021-05-27 MED ORDER — AMLODIPINE BESYLATE 5 MG PO TABS
5.0000 mg | ORAL_TABLET | Freq: Every day | ORAL | Status: DC
  Administered 2021-05-28 – 2021-06-01 (×5): 5 mg via ORAL
  Filled 2021-05-27 (×5): qty 1

## 2021-05-27 MED ORDER — HEPARIN SODIUM (PORCINE) 5000 UNIT/ML IJ SOLN
5000.0000 [IU] | Freq: Three times a day (TID) | INTRAMUSCULAR | Status: DC
  Administered 2021-05-28 – 2021-06-01 (×13): 5000 [IU] via SUBCUTANEOUS
  Filled 2021-05-27 (×14): qty 1

## 2021-05-27 MED ORDER — PROPOFOL 10 MG/ML IV BOLUS
INTRAVENOUS | Status: AC
  Filled 2021-05-27: qty 20

## 2021-05-27 MED ORDER — ENSURE SURGERY PO LIQD
237.0000 mL | Freq: Two times a day (BID) | ORAL | Status: DC
  Administered 2021-05-28 – 2021-05-29 (×2): 237 mL via ORAL

## 2021-05-27 MED ORDER — INSULIN ASPART 100 UNIT/ML IJ SOLN
0.0000 [IU] | Freq: Three times a day (TID) | INTRAMUSCULAR | Status: DC
  Administered 2021-05-27: 18:00:00 1 [IU] via SUBCUTANEOUS

## 2021-05-27 MED ORDER — LIDOCAINE 2% (20 MG/ML) 5 ML SYRINGE
INTRAMUSCULAR | Status: DC | PRN
  Administered 2021-05-27: 60 mg via INTRAVENOUS
  Administered 2021-05-27: 1.5 mg/kg/h via INTRAVENOUS

## 2021-05-27 MED ORDER — FENTANYL CITRATE (PF) 100 MCG/2ML IJ SOLN
INTRAMUSCULAR | Status: DC | PRN
  Administered 2021-05-27: 50 ug via INTRAVENOUS
  Administered 2021-05-27: 100 ug via INTRAVENOUS
  Administered 2021-05-27 (×2): 50 ug via INTRAVENOUS

## 2021-05-27 MED ORDER — ROCURONIUM BROMIDE 10 MG/ML (PF) SYRINGE
PREFILLED_SYRINGE | INTRAVENOUS | Status: AC
  Filled 2021-05-27: qty 10

## 2021-05-27 MED ORDER — SODIUM CHLORIDE 0.9 % IV SOLN
INTRAVENOUS | Status: AC
  Filled 2021-05-27: qty 2

## 2021-05-27 MED ORDER — BUPIVACAINE LIPOSOME 1.3 % IJ SUSP
INTRAMUSCULAR | Status: AC
  Filled 2021-05-27: qty 20

## 2021-05-27 MED ORDER — HYDROMORPHONE HCL 1 MG/ML IJ SOLN
0.5000 mg | INTRAMUSCULAR | Status: DC | PRN
  Administered 2021-05-27: 16:00:00 0.5 mg via INTRAVENOUS
  Filled 2021-05-27: qty 0.5

## 2021-05-27 MED ORDER — ACETAMINOPHEN 500 MG PO TABS
1000.0000 mg | ORAL_TABLET | Freq: Once | ORAL | Status: AC
  Administered 2021-05-27: 07:00:00 1000 mg via ORAL
  Filled 2021-05-27: qty 2

## 2021-05-27 MED ORDER — LACTATED RINGERS IV SOLN: INTRAVENOUS | Status: DC

## 2021-05-27 MED ORDER — ONDANSETRON HCL 4 MG/2ML IJ SOLN
INTRAMUSCULAR | Status: AC
  Filled 2021-05-27: qty 2

## 2021-05-27 MED ORDER — DEXAMETHASONE SODIUM PHOSPHATE 10 MG/ML IJ SOLN
INTRAMUSCULAR | Status: AC
  Filled 2021-05-27: qty 1

## 2021-05-27 MED ORDER — LIDOCAINE HCL 2 % IJ SOLN
INTRAMUSCULAR | Status: AC
  Filled 2021-05-27: qty 20

## 2021-05-27 MED ORDER — OXYCODONE HCL 5 MG PO TABS: 5.0000 mg | ORAL_TABLET | Freq: Once | ORAL | Status: DC | PRN

## 2021-05-27 MED ORDER — OXYCODONE HCL 5 MG/5ML PO SOLN: 5.0000 mg | Freq: Once | ORAL | Status: DC | PRN

## 2021-05-27 MED ORDER — ACETAMINOPHEN 500 MG PO TABS
1000.0000 mg | ORAL_TABLET | Freq: Four times a day (QID) | ORAL | Status: DC
  Administered 2021-05-27 – 2021-06-01 (×18): 1000 mg via ORAL
  Filled 2021-05-27 (×19): qty 2

## 2021-05-27 MED ORDER — IRBESARTAN 75 MG PO TABS
37.5000 mg | ORAL_TABLET | Freq: Every day | ORAL | Status: DC
  Administered 2021-05-28 – 2021-06-01 (×5): 37.5 mg via ORAL
  Filled 2021-05-27 (×5): qty 1

## 2021-05-27 MED ORDER — CHLORHEXIDINE GLUCONATE 0.12 % MT SOLN
15.0000 mL | Freq: Once | OROMUCOSAL | Status: AC
  Administered 2021-05-27: 07:00:00 15 mL via OROMUCOSAL

## 2021-05-27 MED ORDER — LIDOCAINE HCL (PF) 2 % IJ SOLN
INTRAMUSCULAR | Status: AC
  Filled 2021-05-27: qty 5

## 2021-05-27 MED ORDER — BUPIVACAINE LIPOSOME 1.3 % IJ SUSP
INTRAMUSCULAR | Status: DC | PRN
  Administered 2021-05-27: 20 mL

## 2021-05-27 MED ORDER — BUPIVACAINE-EPINEPHRINE 0.25% -1:200000 IJ SOLN
INTRAMUSCULAR | Status: DC | PRN
  Administered 2021-05-27: 30 mL

## 2021-05-27 MED ORDER — DIPHENHYDRAMINE HCL 12.5 MG/5ML PO ELIX: 12.5000 mg | ORAL_SOLUTION | Freq: Four times a day (QID) | ORAL | Status: DC | PRN

## 2021-05-27 MED ORDER — INSULIN ASPART 100 UNIT/ML IJ SOLN: 0.0000 [IU] | Freq: Every day | INTRAMUSCULAR | Status: DC

## 2021-05-27 MED ORDER — PROPOFOL 10 MG/ML IV BOLUS
INTRAVENOUS | Status: DC | PRN
  Administered 2021-05-27: 160 mg via INTRAVENOUS

## 2021-05-27 MED ORDER — DIPHENHYDRAMINE HCL 50 MG/ML IJ SOLN: 12.5000 mg | Freq: Four times a day (QID) | INTRAMUSCULAR | Status: DC | PRN

## 2021-05-27 SURGICAL SUPPLY — 65 items
ADH SKN CLS APL DERMABOND .7 (GAUZE/BANDAGES/DRESSINGS) ×2
APL PRP STRL LF DISP 70% ISPRP (MISCELLANEOUS)
APPLIER CLIP ROT 10 11.4 M/L (STAPLE)
APR CLP MED LRG 11.4X10 (STAPLE)
BAG COUNTER SPONGE SURGICOUNT (BAG) IMPLANT
BAG SPNG CNTER NS LX DISP (BAG)
BAG SURGICOUNT SPONGE COUNTING (BAG)
BLADE CLIPPER SURG (BLADE) ×2 IMPLANT
CABLE HIGH FREQUENCY MONO STRZ (ELECTRODE) ×2 IMPLANT
CELLS DAT CNTRL 66122 CELL SVR (MISCELLANEOUS) IMPLANT
CHLORAPREP W/TINT 26 (MISCELLANEOUS) ×1 IMPLANT
CLIP APPLIE ROT 10 11.4 M/L (STAPLE) IMPLANT
DECANTER SPIKE VIAL GLASS SM (MISCELLANEOUS) ×1 IMPLANT
DERMABOND ADVANCED (GAUZE/BANDAGES/DRESSINGS) ×4
DERMABOND ADVANCED .7 DNX12 (GAUZE/BANDAGES/DRESSINGS) IMPLANT
DISSECTOR BLUNT TIP ENDO 5MM (MISCELLANEOUS) IMPLANT
ELECT REM PT RETURN 15FT ADLT (MISCELLANEOUS) ×3 IMPLANT
GAUZE SPONGE 4X4 12PLY STRL (GAUZE/BANDAGES/DRESSINGS) ×3 IMPLANT
GLOVE SURG ENC MOIS LTX SZ7.5 (GLOVE) ×6 IMPLANT
GLOVE SURG NEOPR MICRO LF SZ8 (GLOVE) ×22 IMPLANT
GOWN STRL REUS W/TWL XL LVL3 (GOWN DISPOSABLE) ×12 IMPLANT
IRRIG SUCT STRYKERFLOW 2 WTIP (MISCELLANEOUS) ×3
IRRIGATION SUCT STRKRFLW 2 WTP (MISCELLANEOUS) IMPLANT
KIT TURNOVER KIT A (KITS) ×2 IMPLANT
LIGASURE IMPACT 36 18CM CVD LR (INSTRUMENTS) IMPLANT
NS IRRIG 1000ML POUR BTL (IV SOLUTION) ×5 IMPLANT
PACK COLON (CUSTOM PROCEDURE TRAY) ×3 IMPLANT
PAD POSITIONING PINK XL (MISCELLANEOUS) IMPLANT
PENCIL SMOKE EVACUATOR (MISCELLANEOUS) ×2 IMPLANT
PROTECTOR NERVE ULNAR (MISCELLANEOUS) IMPLANT
RELOAD PROXIMATE 75MM BLUE (ENDOMECHANICALS) ×3 IMPLANT
RELOAD STAPLE 75 3.8 BLU REG (ENDOMECHANICALS) IMPLANT
RETRACTOR WND ALEXIS 18 MED (MISCELLANEOUS) IMPLANT
RTRCTR WOUND ALEXIS 18CM MED (MISCELLANEOUS)
SCISSORS LAP 5X35 DISP (ENDOMECHANICALS) ×3 IMPLANT
SEALER TISSUE G2 STRG ARTC 35C (ENDOMECHANICALS) IMPLANT
SET TUBE SMOKE EVAC HIGH FLOW (TUBING) ×3 IMPLANT
SHEARS HARMONIC ACE PLUS 36CM (ENDOMECHANICALS) ×2 IMPLANT
SLEEVE ADV FIXATION 5X100MM (TROCAR) ×6 IMPLANT
SLEEVE ENDOPATH XCEL 5M (ENDOMECHANICALS) ×1 IMPLANT
STAPLER GUN LINEAR PROX 60 (STAPLE) ×2 IMPLANT
STAPLER PROXIMATE 75MM BLUE (STAPLE) ×2 IMPLANT
STAPLER VISISTAT 35W (STAPLE) ×1 IMPLANT
SUT MNCRL AB 4-0 PS2 18 (SUTURE) ×5 IMPLANT
SUT PDS AB 1 CT1 27 (SUTURE) ×6 IMPLANT
SUT PROLENE 2 0 CT2 30 (SUTURE) IMPLANT
SUT PROLENE 2 0 KS (SUTURE) IMPLANT
SUT SILK 2 0 (SUTURE) ×3
SUT SILK 2 0 SH CR/8 (SUTURE) ×3 IMPLANT
SUT SILK 2-0 18XBRD TIE 12 (SUTURE) ×1 IMPLANT
SUT SILK 3 0 (SUTURE) ×3
SUT SILK 3 0 SH CR/8 (SUTURE) ×3 IMPLANT
SUT SILK 3-0 18XBRD TIE 12 (SUTURE) ×1 IMPLANT
SYS LAPSCP GELPORT 120MM (MISCELLANEOUS)
SYSTEM LAPSCP GELPORT 120MM (MISCELLANEOUS) IMPLANT
TAPE CLOTH 4X10 WHT NS (GAUZE/BANDAGES/DRESSINGS) IMPLANT
TOWEL OR 17X26 10 PK STRL BLUE (TOWEL DISPOSABLE) ×3 IMPLANT
TRAY FOLEY MTR SLVR 14FR STAT (SET/KITS/TRAYS/PACK) ×3 IMPLANT
TRAY FOLEY MTR SLVR 16FR STAT (SET/KITS/TRAYS/PACK) IMPLANT
TROCAR ADV FIXATION 5X100MM (TROCAR) ×3 IMPLANT
TROCAR BALLN 12MMX100 BLUNT (TROCAR) ×3 IMPLANT
TROCAR XCEL NON-BLD 11X100MML (ENDOMECHANICALS) IMPLANT
TUBING CONNECTING 10 (TUBING) ×4 IMPLANT
TUBING CONNECTING 10' (TUBING) ×2
YANKAUER SUCT BULB TIP NO VENT (SUCTIONS) ×1 IMPLANT

## 2021-05-27 NOTE — Transfer of Care (Signed)
Immediate Anesthesia Transfer of Care Note  Patient: Shelby Wyatt  Procedure(s) Performed: LAPAROSCOPIC RIGHT HEMI COLECTOMY (Right)  Patient Location: PACU  Anesthesia Type:General  Level of Consciousness: drowsy and patient cooperative  Airway & Oxygen Therapy: Patient Spontanous Breathing and Patient connected to face mask oxygen  Post-op Assessment: Report given to RN and Post -op Vital signs reviewed and stable  Post vital signs: Reviewed and stable  Last Vitals:  Vitals Value Taken Time  BP 171/81 05/27/21 1111  Temp    Pulse 95 05/27/21 1114  Resp 14 05/27/21 1114  SpO2 100 % 05/27/21 1114  Vitals shown include unvalidated device data.  Last Pain:  Vitals:   05/27/21 0655  TempSrc: Oral  PainSc:          Complications: No notable events documented.

## 2021-05-27 NOTE — Anesthesia Procedure Notes (Signed)
Date/Time: 05/27/2021 8:48 AM Performed by: Victoriano Lain, CRNA Pre-anesthesia Checklist: Patient identified, Emergency Drugs available, Suction available, Patient being monitored and Timeout performed Patient Re-evaluated:Patient Re-evaluated prior to induction Oxygen Delivery Method: Circle system utilized Preoxygenation: Pre-oxygenation with 100% oxygen Induction Type: IV induction Ventilation: Mask ventilation without difficulty Laryngoscope Size: 4 and Mac Grade View: Grade I Tube type: Oral Tube size: 7.5 mm Number of attempts: 1 Airway Equipment and Method: Stylet Placement Confirmation: ETT inserted through vocal cords under direct vision, positive ETCO2 and breath sounds checked- equal and bilateral Secured at: 22 cm Tube secured with: Tape Dental Injury: Teeth and Oropharynx as per pre-operative assessment

## 2021-05-27 NOTE — H&P (Signed)
Colon cancer, cecum  History of Present Illness: Shelby Wyatt is a 55 y.o. female with history of HTN, DM, whom is seen in the office today as a referral by Dr. Rush Landmark for evaluation of colon cancer.  Colonoscopy 04/07/21 with Dr. Rush Landmark demonstrated a 3.5 cm polyp in the cecum. There was lateral spreading. Some ulceration. The lesion was raised and removed in a piecemeal manner. Coagulation for tissue destruction was also applied. 7 hemostatic clips were placed. 6 mm polyp in the transverse colon removed. Hypertrophied anal papilla.  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 Carcinoma invades submucosa to 6 mm. Resection margins are positive for invasive carcinoma. No evident LVI or poorly differentiated component. Transverse colon polyp returned as a tubular adenoma.  She denies any changes in her health or health history since met in the office. No new medications, allergies, etc. Tolerated bowel prep with satisfactory result.   CEA (04/13/21) 2.1 CT CAP 04/16/21 no evident metastatic disease. Fibroid uterus. Aortic atherosclerosis. Clips evident in cecum.  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 Denies any other known family history of colorectal, breast, endometrial or ovarian cancer  Social Hx: +tobacco use 7-10 cigarettes per day, working on quitting with patch. Denies EtOH. illicit drug. 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  Review of Systems: A complete review of systems was obtained from the patient. I have reviewed this information and discussed as appropriate with the patient. See HPI as well for other ROS.   Past Medical History:  Diagnosis Date   Allergy    Hypertension    No pertinent past medical history    Pre-diabetes    Tubal pregnancy     Past Surgical History:   Procedure Laterality Date   COLONOSCOPY     COLONOSCOPY WITH PROPOFOL N/A 04/07/2021   Procedure: COLONOSCOPY WITH PROPOFOL;  Surgeon: Rush Landmark Telford Nab., MD;  Location: [facility];  Service: Gastroenterology;  Laterality: N/A;   ENDOSCOPIC MUCOSAL RESECTION N/A 04/07/2021   Procedure: ENDOSCOPIC MUCOSAL RESECTION;  Surgeon: Rush Landmark Telford Nab., MD;  Location: [facility];  Service: Gastroenterology;  Laterality: N/A;   HEMOSTASIS CLIP PLACEMENT  04/07/2021   Procedure: HEMOSTASIS CLIP PLACEMENT;  Surgeon: Rush Landmark Telford Nab., MD;  Location: [facility];  Service: Gastroenterology;;   laporoscopy     SUBMUCOSAL LIFTING INJECTION  04/07/2021   Procedure: SUBMUCOSAL LIFTING INJECTION;  Surgeon: Irving Copas., MD;  Location: [facility];  Service: Gastroenterology;;   TUBAL LIGATION     WISDOM TOOTH EXTRACTION      Family History  Problem Relation Age of Onset   Hypertension Mother    Diabetes Mother    Breast cancer Mother    Hypertension Maternal Grandmother    Hypertension Paternal Grandmother    Hypertension Paternal Grandfather    Colon cancer Neg Hx    Esophageal cancer Neg Hx    Rectal cancer Neg Hx    Stomach cancer Neg Hx    Inflammatory bowel disease Neg Hx    Liver disease Neg Hx    Pancreatic cancer Neg Hx     Social:  reports that she quit smoking about 3 weeks ago. Her smoking use included cigarettes. She smoked an average of .25 packs per day. She has never used smokeless tobacco. She reports current alcohol use. She reports current drug use. Drug: Marijuana.  Allergies:  Allergies  Allergen Reactions   Lisinopril Cough   Terbinafine And Related Hives    Medications: I have reviewed the patient's current medications.  No results found for this or any previous visit (from the past 48 hour(s)).  No results found.  ROS - all of the below systems have been reviewed with the patient and positives are indicated with bold  text General: chills, fever or night sweats Eyes: blurry vision or double vision ENT: epistaxis or sore throat Allergy/Immunology: itchy/watery eyes or nasal congestion Hematologic/Lymphatic: bleeding problems, blood clots or swollen lymph nodes Endocrine: temperature intolerance or unexpected weight changes Breast: new or changing breast lumps or nipple discharge Resp: cough, shortness of breath, or wheezing CV: chest pain or dyspnea on exertion GI: as per HPI GU: dysuria, trouble voiding, or hematuria MSK: joint pain or joint stiffness Neuro: TIA or stroke symptoms Derm: pruritus and skin lesion changes Psych: anxiety and depression  PE Height 5' 2"  (1.575 m), weight 85.3 kg. Constitutional: NAD; conversant Eyes: Moist conjunctiva; no lid lag; anicteric Lungs: Normal respiratory effort CV: RRR GI: Abd soft, NT/ND; no palpable hepatosplenomegaly MSK: Normal range of motion of extremities Psychiatric: Appropriate affect; alert and oriented x3  No results found for this or any previous visit (from the past 48 hour(s)).  No results found.   A/P: Shelby Wyatt is an 55 y.o. female with hx of HTN, DM here for surgery for colon cancer within a resected mass from the cecum. The adenocarcinoma component was found to extend at least 6 mm and of the submucosa with positive margins.  CEA 2.1 (04/13/21) CT CAP 04/16/21 - no evident metastatic disease. Clips are apparent in the cecum. Normal TI/appendix  -The anatomy and physiology of the GI tract was reviewed with the patient. The pathophysiology of colon cancer was discussed as well with associated pictures. -We have discussed various different treatment options going forward including surgery (the most definitive) to address this -laparoscopic right hemicolectomy. -We discussed with her having had a involved margin and piecemeal resection of this mass which contained adenocarcinoma, a higher risk of local recurrence. We also  discussed the possibility of lymph node metastasis for colon cancer in general. We discussed observation would carry a higher risk of "regrowth" and subsequent potential spread. She would like to proceed with surgery. -The planned procedure, material risks (including, but not limited to, pain, bleeding, infection, scarring, need for blood transfusion, damage to surrounding structures- blood vessels/nerves/viscus/organs, damage to ureter, leak from anastomosis, need for additional procedures, scenarios where a stoma may be necessary and where it could even be permanent, worsening of pre-existing medical conditions, hernia, recurrence, pneumonia, heart attack, stroke, death) benefits and alternatives to surgery were discussed at length. The patient's questions were answered to her satisfaction, she voiced understanding and elected to proceed with surgery. Additionally, we discussed typical postoperative expectations and the recovery process. -We have strongly recommended she quit smoking and also work to liberate herself from nicotine patches. We discussed high risks of infectious, anastomotic, and hernia complications with ongoing nicotine and tobacco use. She expressed understanding and states she plans to quit today.  Nadeen Landau, MD [facility] Use [email or web address] to contact on call provider

## 2021-05-27 NOTE — Anesthesia Postprocedure Evaluation (Signed)
Anesthesia Post Note  Patient: Shelby Wyatt  Procedure(s) Performed: LAPAROSCOPIC RIGHT HEMI COLECTOMY (Right)     Patient location during evaluation: PACU Anesthesia Type: General Level of consciousness: awake and alert Pain management: pain level controlled Vital Signs Assessment: post-procedure vital signs reviewed and stable Respiratory status: spontaneous breathing, nonlabored ventilation, respiratory function stable and patient connected to nasal cannula oxygen Cardiovascular status: blood pressure returned to baseline and stable Postop Assessment: no apparent nausea or vomiting Anesthetic complications: no   No notable events documented.  Last Vitals:  Vitals:   05/27/21 1113 05/27/21 1130  BP: (!) 171/81   Pulse: 95   Resp: 15   Temp: 36.6 C   SpO2: 100% 90%    Last Pain:  Vitals:   05/27/21 1113  TempSrc:   PainSc: 0-No pain                 Lidia Collum

## 2021-05-27 NOTE — Op Note (Signed)
PATIENT: Shelby Wyatt  55 y.o. female  Patient Care Team: Ladell Pier, MD as PCP - General (Internal Medicine)  PREOP DIAGNOSIS: Cecal adenocarcinoma  POSTOP DIAGNOSIS: Same  PROCEDURE: Laparoscopic right hemicolectomy  SURGEON: Shelby Mt. Ousmane Seeman, MD  ASSISTANT: Shelby Ditch PA-C  ANESTHESIA: General endotracheal  EBL: 50 mL Total I/O In: 1100 [I.V.:1000; IV Piggyback:100] Out: 80 [Urine:30; Blood:50]  DRAINS: None  SPECIMEN: Right colon  COUNTS: Sponge, needle and instrument counts were reported correct x2  FINDINGS:  No evidence of extension beyond the colon. Normal appearing appendix. No evident peritoneal surface lesions or lesions visible on the liver surface. Right hemicolectomy carried out including right branch of middle colic pedicle. Stapled ileo-transverse anastomosis fashioned.  NARRATIVE:  The patient was identified & brought into the operating room, placed supine on the operating table and SCDs were applied to the lower extremities. General endotracheal anesthesia was induced. The patient was positioned supine with arms tucked. Antibiotics were administered. A foley catheter was placed under sterile conditions. Hair in the region of planned surgery was clipped. The abdomen was prepped and draped in a sterile fashion. A timeout was performed confirming our patient and plan.   Beginning with the extraction port, a supraumbilical incision was made and carried down to the midline fascia. This was then incised with electrocautery. The peritoneum was identified and elevated between clamps and carefully opened sharply. A small Alexis wound protector with a cap and associated port was then placed. The abdomen was insufflated to 15 mmHg with Co2. A laparoscope was placed and camera inspection revealed no evidence of injury. Bilateral TAP blocks were then performed under laparoscopic visualization using a mixture of 0.25% marcaine with epinepherine + Exparel. 3  additional ports were then placed under direct laparoscopic visualization - two in the left hemiabdomen and one in the right abdomen. The abdomen was surveyed. The liver and peritoneum appeared normal.  There were no signs of metastatic disease.  She was positioned in trendelenburg with left side down. The ileocolic pedicle was identified. Gentle blunt dissection commenced around the pedicle and the duodenum was identified and freed from the surrounding structures. We carefully developed the retroperitoneal plane bluntly.  We freed the appendix off its attachments to the pelvic wall. The terminal ileum was mobilized, taking care to avoid injuring any retroperitoneal structures.  After this we began to mobilize laterally down the [name] line of Toldt and then took down the hepatic flexure using the Enseal device. The omentum was mobilized off of the right side of the transverse colon. The entire colon was then flipped medially and mobilized off of the retroperitoneal structures until we could visualize the lateral edge of the duodenum underneath. The ileocolic pedicle was circumferentially dissected, confirming duodenum was down and away. This was then ligated and divided with the Enseal device. The stump is inspected and hemostatic.   At this point, the abdomen was desufflated and the terminal ileum and right colon delivered through the wound protector. The terminal ileum was then transected using a GIA blue load stapler. The remaining mesentery was divided using the Enseal device. A bowel clamp is placed on the proximal staple line to assist in maintaining orientation of the ileum. The divided mesentery was inspected and noted to be hemostatic. The distal point of transection was then identified on the transverse colon at a location that included the right branch of the middle colics, leaving the main middle colic feeding the remaining transverse colon. This was transected using another  blue load GIA stapler.   The specimen was then passed off. Attention was turned to creating the anastomosis. The terminal ileum and transverse colon were inspected for orientation to ensure no twisting nor bowel included in the mesenteric defect. An anastomosis was created between the terminal ileum and the transverse colon using a 75 mm GIA blue load stapler. The staple line was inspected and noted to be hemostatic.  The common enterotomy channel was closed using a TA 60 blue load stapler. Hemostasis was achieved at the staple line using 3-0 silk U-stitches. 3-0 silk sutures were used to imbricate the corners of the staple line as well.  A 2-0 silk suture was placed securing the "crotch" of the anastomosis. The anastomosis was palpated and noted to be widely patent. This was then placed back into the abdomen. The abdomen was then irrigated with sterile saline and hemostasis verified. The omentum was then brought down over the anastomosis. The wound protector cap was replaced and CO2 reinsufflated. The laparoscopic ports were removed under direct visualization and the sites noted to be hemostatic. The Alexis wound protector was removed, counts were reported correct, and we switched to clean instruments, gowns and drapes.   The fascia was then closed using two running #1 PDS sutures.  The skin of all incision sites was closed with 4-0 monocryl subcuticular suture. Dermabond was placed on the port sites and a sterile dressing was placed over the abdominal incision. All counts are reported correct x 2. She was then awakened from anesthesia and sent to the post anesthesia care unit in stable condition.    DISPOSITION: PACU in satisfactory condition

## 2021-05-27 NOTE — Progress Notes (Signed)
Brief Pharmacy note: Alvimopan  Pre-op dose was not ordered, post-op bid order rejected  Minda Ditto PharmD [facility] [phone] 05/27/2021, 2:20 PM

## 2021-05-28 ENCOUNTER — Other Ambulatory Visit (HOSPITAL_COMMUNITY): Payer: Self-pay

## 2021-05-28 ENCOUNTER — Encounter (HOSPITAL_COMMUNITY): Payer: Self-pay | Admitting: Surgery

## 2021-05-28 LAB — GLUCOSE, CAPILLARY
Glucose-Capillary: 129 mg/dL — ABNORMAL HIGH (ref 70–99)
Glucose-Capillary: 105 mg/dL — ABNORMAL HIGH (ref 70–99)
Glucose-Capillary: 125 mg/dL — ABNORMAL HIGH (ref 70–99)
Glucose-Capillary: 122 mg/dL — ABNORMAL HIGH (ref 70–99)

## 2021-05-28 LAB — CBC
HCT: 37.5 % (ref 36.0–46.0)
Hemoglobin: 12.6 g/dL (ref 12.0–15.0)
MCH: 28.4 pg (ref 26.0–34.0)
MCHC: 33.6 g/dL (ref 30.0–36.0)
MCV: 84.7 fL (ref 80.0–100.0)
Platelets: 239 10*3/uL (ref 150–400)
RBC: 4.43 MIL/uL (ref 3.87–5.11)
RDW: 15.1 % (ref 11.5–15.5)
WBC: 17.5 10*3/uL — ABNORMAL HIGH (ref 4.0–10.5)
nRBC: 0 % (ref 0.0–0.2)

## 2021-05-28 LAB — BASIC METABOLIC PANEL
Anion gap: 6 (ref 5–15)
BUN: 8 mg/dL (ref 6–20)
CO2: 23 mmol/L (ref 22–32)
Calcium: 8.5 mg/dL — ABNORMAL LOW (ref 8.9–10.3)
Chloride: 106 mmol/L (ref 98–111)
Creatinine, Ser: 0.63 mg/dL (ref 0.44–1.00)
GFR, Estimated: >60 mL/min (ref >60)
Glucose, Bld: 146 mg/dL — ABNORMAL HIGH (ref 70–99)
Potassium: 4.3 mmol/L (ref 3.5–5.1)
Sodium: 135 mmol/L (ref 135–145)

## 2021-05-28 MED ORDER — TRAMADOL HCL 50 MG PO TABS
50.0000 mg | ORAL_TABLET | Freq: Four times a day (QID) | ORAL | 0 refills | Status: AC | PRN
  Filled 2021-05-28: qty 15, 4d supply, fill #0

## 2021-05-28 NOTE — Progress Notes (Signed)
°  Subjective No acute events. Feeling well. Has been up walking. Tolerating liquids without any difficulty, nausea, vomiting or bloating. Denies flatus/bm yet. Pain well controlled  Objective: Vital signs in last 24 hours: Temp:  [97.9 F (36.6 C)-98.8 F (37.1 C)] 98.4 F (36.9 C) (12/15 0538) Pulse Rate:  [65-95] 65 (12/15 0538) Resp:  [12-18] 18 (12/15 0538) BP: (108-171)/(60-92) 135/74 (12/15 0538) SpO2:  [90 %-100 %] 95 % (12/15 0538)    Intake/Output from previous day: 12/14 0701 - 12/15 0700 In: 3588.8 [P.O.:800; I.V.:2688.8; IV Piggyback:100] Out: 3230 [Urine:3180; Blood:50] Intake/Output this shift: No intake/output data recorded.  Gen: NAD, comfortable CV: RRR Pulm: Normal work of breathing Abd: Soft, appropriate incisional tenderness, no rebound/guarding; not significantly distended. Incisions c/d/I without erythema or drainage Ext: SCDs in place  Lab Results: CBC  Recent Labs    05/28/21 0421  WBC 17.5*  HGB 12.6  HCT 37.5  PLT 239   BMET Recent Labs    05/28/21 0421  NA 135  K 4.3  CL 106  CO2 23  GLUCOSE 146*  BUN 8  CREATININE 0.63  CALCIUM 8.5*   PT/INR No results for input(s): LABPROT, INR in the last 72 hours. ABG No results for input(s): PHART, HCO3 in the last 72 hours.  Invalid input(s): PCO2, PO2  Studies/Results:  Anti-infectives: Anti-infectives (From admission, onward)    Start     Dose/Rate Route Frequency Ordered Stop   05/27/21 0745  cefoTEtan (CEFOTAN) 2 g in sodium chloride 0.9 % 100 mL IVPB        2 g 200 mL/hr over 30 Minutes Intravenous  Once 05/27/21 0743 05/27/21 0920   05/27/21 0738  sodium chloride 0.9 % with cefoTEtan (CEFOTAN) ADS Med       Note to Pharmacy: Kyra Leyland E: cabinet override      05/27/21 0738 05/27/21 0854        Assessment/Plan: Patient Active Problem List   Diagnosis Date Noted   Colon cancer ([place]) 05/27/2021   Glaucoma suspect 05/27/2021   Serrated polyp of colon 04/02/2021    Hx of adenomatous colonic polyps 04/02/2021   Cecal polyp 04/02/2021   Abnormal colonoscopy 04/02/2021   Cough due to ACE inhibitor 03/05/2021   Obesity (BMI 30-39.9) 03/05/2021   Essential hypertension 12/25/2020   Prediabetes 12/25/2020   Perimenopausal 12/25/2020   Tobacco dependence 12/01/2020   Incontinence of feces 12/01/2020   Abdominal pain 12/01/2020   s/p Procedure(s): LAPAROSCOPIC RIGHT HEMI COLECTOMY 05/27/2021  -She is doing well. We spent time reviewing her procedure, findings, and plans moving forward -Bariatric (low sugar) full liquids; advance to diabetic diet as tolerated -Restart metformin once on solids and tolerating reliably -Ambulate 5x/day -D/C Foley -D/C IVF -Ppx: SQH, SCDs   LOS: 1 day   Nadeen Landau, MD [facility] Use [email or web address] to contact on call provider

## 2021-05-28 NOTE — Progress Notes (Signed)
Transition of Care [facility]) Screening Note  Patient Details  Name: CHIDINMA CLITES Date of Birth: [birth year]  Transition of Care [facility]) CM/SW Contact:    Sherie Don, LCSW Phone Number: 05/28/2021, 10:40 AM  Transition of Care Department [facility]) has reviewed patient and no TOC needs have been identified at this time. We will continue to monitor patient advancement through interdisciplinary progression rounds. If new patient transition needs arise, please place a TOC consult.

## 2021-05-28 NOTE — Discharge Instructions (Signed)
POST OP INSTRUCTIONS AFTER COLON SURGERY  DIET: Be sure to include lots of fluids daily to stay hydrated - 64oz of water per day (8, 8 oz glasses).  Avoid fast food or heavy meals for the first couple of weeks as your are more likely to get nauseated. Avoid raw/uncooked fruits or vegetables for the first 4 weeks (its ok to have these if they are blended into smoothie form). If you have fruits/vegetables, make sure they are cooked until soft enough to mash on the roof of your mouth and chew your food well. Otherwise, diet as tolerated.  Take your usually prescribed home medications unless otherwise directed.  PAIN CONTROL: Pain is best controlled by a usual combination of three different methods TOGETHER: Ice/Heat Over the counter pain medication Prescription pain medication Most patients will experience some swelling and bruising around the surgical site.  Ice packs or heating pads (30-60 minutes up to 6 times a day) will help. Some people prefer to use ice alone, heat alone, alternating between ice & heat.  Experiment to what works for you.  Swelling and bruising can take several weeks to resolve.   It is helpful to take an over-the-counter pain medication regularly for the first few weeks: Ibuprofen (Motrin/Advil) - 200mg tabs - take 3 tabs (600mg) every 6 hours as needed for pain (unless you have been directed previously to avoid NSAIDs/ibuprofen) Acetaminophen (Tylenol) - you may take 650mg every 6 hours as needed. You can take this with motrin as they act differently on the body. If you are taking a narcotic pain medication that has acetaminophen in it, do not take over the counter tylenol at the same time. NOTE: You may take both of these medications together - most patients  find it most helpful when alternating between the two (i.e. Ibuprofen at 6am, tylenol at 9am, ibuprofen at 12pm ...) A  prescription for pain medication should be given to you upon discharge.  Take your pain medication as  prescribed if your pain is not adequatly controlled with the over-the-counter pain reliefs mentioned above.  Avoid getting constipated.  Between the surgery and the pain medications, it is common to experience some constipation.  Increasing fluid intake and taking a fiber supplement (such as Metamucil, Citrucel, FiberCon, MiraLax, etc) 1-2 times a day regularly will usually help prevent this problem from occurring.  A mild laxative (prune juice, Milk of Magnesia, MiraLax, etc) should be taken according to package directions if there are no bowel movements after 48 hours.    Dressing: Your incisions are covered in Dermabond which is like sterile superglue for the skin. This will come off on it's own in a couple weeks. It is waterproof and you may bathe normally starting the day after your surgery in a shower. Avoid baths/pools/lakes/oceans until your wounds have fully healed.  ACTIVITIES as tolerated:   Avoid heavy lifting (>10lbs or 1 gallon of milk) for the next 6 weeks. You may resume regular daily activities as tolerated--such as daily self-care, walking, climbing stairs--gradually increasing activities as tolerated.  If you can walk 30 minutes without difficulty, it is safe to try more intense activity such as jogging, treadmill, bicycling, low-impact aerobics.  DO NOT PUSH THROUGH PAIN.  Let pain be your guide: If it hurts to do something, don't do it. You may drive when you are no longer taking prescription pain medication, you can comfortably wear a seatbelt, and you can safely maneuver your car and apply brakes.  FOLLOW UP in our   office Please call [facility] at [phone] to set up an appointment to see your surgeon in the office for a follow-up appointment approximately 2 weeks after your surgery. Make sure that you call for this appointment the day you arrive home to insure a convenient appointment time.  9. If you have disability or family leave forms that need to be completed, you may have  them completed by your primary care physician's office; for return to work instructions, please ask our office staff and they will be happy to assist you in obtaining this documentation   When to call us [phone]: Poor pain control Reactions / problems with new medications (rash/itching, etc)  Fever over 101.5 F (38.5 C) Inability to urinate Nausea/vomiting Worsening swelling or bruising Continued bleeding from incision. Increased pain, redness, or drainage from the incision  The clinic staff is available to answer your questions during regular business hours (8:30am-5pm).  Please don't hesitate to call and ask to speak to one of our nurses for clinical concerns.   A surgeon from [facility] is always on call at the hospitals   If you have a medical emergency, go to the nearest emergency room or call 911.  [facility] [address], [address], [place], [place]  [address] MAIN: [phone] FAX: [phone] [email or web address]  

## 2021-05-29 LAB — BASIC METABOLIC PANEL
Anion gap: 9 (ref 5–15)
BUN: 10 mg/dL (ref 6–20)
CO2: 25 mmol/L (ref 22–32)
Calcium: 9 mg/dL (ref 8.9–10.3)
Chloride: 103 mmol/L (ref 98–111)
Creatinine, Ser: 0.57 mg/dL (ref 0.44–1.00)
GFR, Estimated: >60 mL/min (ref >60)
Glucose, Bld: 106 mg/dL — ABNORMAL HIGH (ref 70–99)
Potassium: 3.8 mmol/L (ref 3.5–5.1)
Sodium: 137 mmol/L (ref 135–145)

## 2021-05-29 LAB — CBC
HCT: 35.3 % — ABNORMAL LOW (ref 36.0–46.0)
Hemoglobin: 11.7 g/dL — ABNORMAL LOW (ref 12.0–15.0)
MCH: 28.1 pg (ref 26.0–34.0)
MCHC: 33.1 g/dL (ref 30.0–36.0)
MCV: 84.9 fL (ref 80.0–100.0)
Platelets: 230 10*3/uL (ref 150–400)
RBC: 4.16 MIL/uL (ref 3.87–5.11)
RDW: 15.3 % (ref 11.5–15.5)
WBC: 12.5 10*3/uL — ABNORMAL HIGH (ref 4.0–10.5)
nRBC: 0 % (ref 0.0–0.2)

## 2021-05-29 LAB — GLUCOSE, CAPILLARY
Glucose-Capillary: 88 mg/dL (ref 70–99)
Glucose-Capillary: 130 mg/dL — ABNORMAL HIGH (ref 70–99)
Glucose-Capillary: 70 mg/dL (ref 70–99)
Glucose-Capillary: 118 mg/dL — ABNORMAL HIGH (ref 70–99)

## 2021-05-29 MED ORDER — METFORMIN HCL 500 MG PO TABS
500.0000 mg | ORAL_TABLET | Freq: Two times a day (BID) | ORAL | Status: DC
  Administered 2021-05-29 – 2021-06-01 (×6): 500 mg via ORAL
  Filled 2021-05-29 (×6): qty 1

## 2021-05-29 NOTE — Progress Notes (Signed)
°  Subjective No acute events. Feeling well. Mobilizing well on her own. Tolerating carb modified diet without any difficulty, nausea, vomiting or bloating. Denies flatus/bm yet. Pain well controlled  Objective: Vital signs in last 24 hours: Temp:  [97.8 F (36.6 C)-98.4 F (36.9 C)] 98.3 F (36.8 C) (12/16 0642) Pulse Rate:  [57-68] 57 (12/16 0642) Resp:  [18] 18 (12/16 0642) BP: (120-139)/(72-81) 124/75 (12/16 0642) SpO2:  [94 %-100 %] 99 % (12/16 0642)    Intake/Output from previous day: 12/15 0701 - 12/16 0700 In: 1026.3 [P.O.:840; I.V.:186.3] Out: 2550 [Urine:2550] Intake/Output this shift: No intake/output data recorded.  Gen: NAD, comfortable CV: RRR Pulm: Normal work of breathing Abd: Soft, appropriate incisional tenderness, no rebound/guarding; not significantly distended. Incisions c/d/I without erythema or drainage Ext: SCDs in place  Lab Results: CBC  Recent Labs    05/28/21 0421 05/29/21 0427  WBC 17.5* 12.5*  HGB 12.6 11.7*  HCT 37.5 35.3*  PLT 239 230   BMET Recent Labs    05/28/21 0421 05/29/21 0427  NA 135 137  K 4.3 3.8  CL 106 103  CO2 23 25  GLUCOSE 146* 106*  BUN 8 10  CREATININE 0.63 0.57  CALCIUM 8.5* 9.0   PT/INR No results for input(s): LABPROT, INR in the last 72 hours. ABG No results for input(s): PHART, HCO3 in the last 72 hours.  Invalid input(s): PCO2, PO2  Studies/Results:  Anti-infectives: Anti-infectives (From admission, onward)    Start     Dose/Rate Route Frequency Ordered Stop   05/27/21 0745  cefoTEtan (CEFOTAN) 2 g in sodium chloride 0.9 % 100 mL IVPB        2 g 200 mL/hr over 30 Minutes Intravenous  Once 05/27/21 0743 05/27/21 0920   05/27/21 0738  sodium chloride 0.9 % with cefoTEtan (CEFOTAN) ADS Med       Note to Pharmacy: Kyra Leyland E: cabinet override      05/27/21 0738 05/27/21 0854        Assessment/Plan: Patient Active Problem List   Diagnosis Date Noted   Colon cancer ([place]) 05/27/2021    Glaucoma suspect 05/27/2021   Serrated polyp of colon 04/02/2021   Hx of adenomatous colonic polyps 04/02/2021   Cecal polyp 04/02/2021   Abnormal colonoscopy 04/02/2021   Cough due to ACE inhibitor 03/05/2021   Obesity (BMI 30-39.9) 03/05/2021   Essential hypertension 12/25/2020   Prediabetes 12/25/2020   Perimenopausal 12/25/2020   Tobacco dependence 12/01/2020   Incontinence of feces 12/01/2020   Abdominal pain 12/01/2020   s/p Procedure(s): LAPAROSCOPIC RIGHT HEMI COLECTOMY 05/27/2021  -She is doing well -Carb modified diet as tolerated -Restart metformin -Ambulate 5x/day -Ppx: SQH, SCDs -Dispo: Possible d/c later today vs tomorrow pending flatus or bm.   LOS: 2 days   Nadeen Landau, MD [facility] Use [email or web address] to contact on call provider

## 2021-05-30 ENCOUNTER — Inpatient Hospital Stay (HOSPITAL_COMMUNITY): Payer: 59

## 2021-05-30 LAB — BASIC METABOLIC PANEL
Anion gap: 9 (ref 5–15)
BUN: 11 mg/dL (ref 6–20)
CO2: 26 mmol/L (ref 22–32)
Calcium: 9 mg/dL (ref 8.9–10.3)
Chloride: 99 mmol/L (ref 98–111)
Creatinine, Ser: 0.64 mg/dL (ref 0.44–1.00)
GFR, Estimated: >60 mL/min (ref >60)
Glucose, Bld: 127 mg/dL — ABNORMAL HIGH (ref 70–99)
Potassium: 4.1 mmol/L (ref 3.5–5.1)
Sodium: 134 mmol/L — ABNORMAL LOW (ref 135–145)

## 2021-05-30 LAB — GLUCOSE, CAPILLARY
Glucose-Capillary: 113 mg/dL — ABNORMAL HIGH (ref 70–99)
Glucose-Capillary: 120 mg/dL — ABNORMAL HIGH (ref 70–99)
Glucose-Capillary: 105 mg/dL — ABNORMAL HIGH (ref 70–99)
Glucose-Capillary: 106 mg/dL — ABNORMAL HIGH (ref 70–99)

## 2021-05-30 LAB — CBC
HCT: 41 % (ref 36.0–46.0)
Hemoglobin: 13.6 g/dL (ref 12.0–15.0)
MCH: 28.2 pg (ref 26.0–34.0)
MCHC: 33.2 g/dL (ref 30.0–36.0)
MCV: 84.9 fL (ref 80.0–100.0)
Platelets: 275 10*3/uL (ref 150–400)
RBC: 4.83 MIL/uL (ref 3.87–5.11)
RDW: 15.2 % (ref 11.5–15.5)
WBC: 10 10*3/uL (ref 4.0–10.5)
nRBC: 0 % (ref 0.0–0.2)

## 2021-05-30 MED ORDER — GLUCERNA SHAKE PO LIQD
237.0000 mL | Freq: Two times a day (BID) | ORAL | Status: DC
  Filled 2021-05-30 (×4): qty 237

## 2021-05-30 MED ORDER — PNEUMOCOCCAL VAC POLYVALENT 25 MCG/0.5ML IJ INJ
0.5000 mL | INJECTION | INTRAMUSCULAR | Status: DC
  Filled 2021-05-30: qty 0.5

## 2021-05-30 MED ORDER — METOCLOPRAMIDE HCL 10 MG/10ML PO SOLN
5.0000 mg | Freq: Three times a day (TID) | ORAL | Status: DC
  Administered 2021-05-30 – 2021-06-01 (×5): 5 mg via ORAL
  Filled 2021-05-30 (×7): qty 10

## 2021-05-30 MED ORDER — SODIUM CHLORIDE 0.9 % IV SOLN
12.5000 mg | Freq: Once | INTRAVENOUS | Status: AC
  Administered 2021-05-30: 12.5 mg via INTRAVENOUS
  Filled 2021-05-30: qty 12.5

## 2021-05-30 NOTE — Progress Notes (Signed)
3 Days Post-Op   Subjective/Chief Complaint: Feels more bloated this am. No flatus   Objective: Vital signs in last 24 hours: Temp:  [98.3 F (36.8 C)-99.2 F (37.3 C)] 99.1 F (37.3 C) (12/17 0500) Pulse Rate:  [61-72] 63 (12/17 0500) Resp:  [16-18] 18 (12/17 0500) BP: (127-140)/(71-79) 127/71 (12/17 0500) SpO2:  [96 %-100 %] 96 % (12/17 0500) Last BM Date: 05/29/21  Intake/Output from previous day: 12/16 0701 - 12/17 0700 In: 1200 [P.O.:1200] Out: 1600 [Urine:1600] Intake/Output this shift: Total I/O In: 120 [P.O.:120] Out: -   General appearance: alert and cooperative Resp: clear to auscultation bilaterally Cardio: regular rate and rhythm GI: soft, mild tenderness. Incisions ok, quiet  Lab Results:  Recent Labs    05/29/21 0427 05/30/21 0511  WBC 12.5* 10.0  HGB 11.7* 13.6  HCT 35.3* 41.0  PLT 230 275   BMET Recent Labs    05/29/21 0427 05/30/21 0511  NA 137 134*  K 3.8 4.1  CL 103 99  CO2 25 26  GLUCOSE 106* 127*  BUN 10 11  CREATININE 0.57 0.64  CALCIUM 9.0 9.0   PT/INR No results for input(s): LABPROT, INR in the last 72 hours. ABG No results for input(s): PHART, HCO3 in the last 72 hours.  Invalid input(s): PCO2, PO2  Studies/Results: No results found.  Anti-infectives: Anti-infectives (From admission, onward)    Start     Dose/Rate Route Frequency Ordered Stop   05/27/21 0745  cefoTEtan (CEFOTAN) 2 g in sodium chloride 0.9 % 100 mL IVPB        2 g 200 mL/hr over 30 Minutes Intravenous  Once 05/27/21 0743 05/27/21 0920   05/27/21 0738  sodium chloride 0.9 % with cefoTEtan (CEFOTAN) ADS Med       Note to Pharmacy: Kyra Leyland E: cabinet override      05/27/21 0738 05/27/21 0854       Assessment/Plan: s/p Procedure(s): LAPAROSCOPIC RIGHT HEMI COLECTOMY (Right) Pt seems to be developing an ileus. Will monitor her ability to tolerate diet Ambulate -Bariatric (low sugar) full liquids; advance to diabetic diet as  tolerated -Restart metformin once on solids and tolerating reliably -Ambulate 5x/day -D/C Foley -D/C IVF -Ppx: SQH, SCDs POD3  LOS: 3 days    Autumn Messing III 05/30/2021

## 2021-05-31 LAB — TYPE AND SCREEN
ABO/RH(D): B POS
Antibody Screen: POSITIVE
Unit division: 0

## 2021-05-31 LAB — BPAM RBC
Blood Product Expiration Date: 202301102359
Unit Type and Rh: 7300

## 2021-05-31 LAB — GLUCOSE, CAPILLARY
Glucose-Capillary: 103 mg/dL — ABNORMAL HIGH (ref 70–99)
Glucose-Capillary: 91 mg/dL (ref 70–99)
Glucose-Capillary: 89 mg/dL (ref 70–99)
Glucose-Capillary: 114 mg/dL — ABNORMAL HIGH (ref 70–99)

## 2021-05-31 NOTE — Progress Notes (Signed)
4 Days Post-Op   Subjective/Chief Complaint: Feels better today. Less distension. Just had a bm   Objective: Vital signs in last 24 hours: Temp:  [98.3 F (36.8 C)-99.4 F (37.4 C)] 99.4 F (37.4 C) (12/17 2134) Pulse Rate:  [70-72] 72 (12/17 2134) Resp:  [16-18] 16 (12/17 2134) BP: (127-133)/(73-75) 127/73 (12/17 2134) SpO2:  [94 %-100 %] 100 % (12/17 2134) Weight:  [82.6 kg] 82.6 kg (12/18 0500) Last BM Date: 05/30/21  Intake/Output from previous day: 12/17 0701 - 12/18 0700 In: 1540 [P.O.:1440; IV Piggyback:100] Out: 1451 [Urine:1450; Stool:1] Intake/Output this shift: Total I/O In: 120 [P.O.:120] Out: 600 [Urine:600]  General appearance: alert and cooperative Resp: clear to auscultation bilaterally Cardio: regular rate and rhythm GI: soft, mild tenderness. Incisions ok  Lab Results:  Recent Labs    05/29/21 0427 05/30/21 0511  WBC 12.5* 10.0  HGB 11.7* 13.6  HCT 35.3* 41.0  PLT 230 275   BMET Recent Labs    05/29/21 0427 05/30/21 0511  NA 137 134*  K 3.8 4.1  CL 103 99  CO2 25 26  GLUCOSE 106* 127*  BUN 10 11  CREATININE 0.57 0.64  CALCIUM 9.0 9.0   PT/INR No results for input(s): LABPROT, INR in the last 72 hours. ABG No results for input(s): PHART, HCO3 in the last 72 hours.  Invalid input(s): PCO2, PO2  Studies/Results: DG Abd 1 View  Result Date: 05/30/2021 CLINICAL DATA:  Status post partial colectomy 3 days ago. Abdominal pain with nausea. EXAM: ABDOMEN - 1 VIEW COMPARISON:  CT chest abdomen and pelvis 04/16/2021. FINDINGS: There are new surgical changes/staple line within the right abdomen. There is no evidence for bowel obstruction. No dilated bowel loops are seen. No suspicious calcifications. There is body wall edema. No acute fractures are identified. IMPRESSION: 1. No evidence for bowel obstruction. 2. Surgical changes in the right abdomen. 3. Body wall edema. Electronically Signed   By: Ronney Asters M.D.   On: 05/30/2021 15:32     Anti-infectives: Anti-infectives (From admission, onward)    Start     Dose/Rate Route Frequency Ordered Stop   05/27/21 0745  cefoTEtan (CEFOTAN) 2 g in sodium chloride 0.9 % 100 mL IVPB        2 g 200 mL/hr over 30 Minutes Intravenous  Once 05/27/21 0743 05/27/21 0920   05/27/21 0738  sodium chloride 0.9 % with cefoTEtan (CEFOTAN) ADS Med       Note to Pharmacy: Kyra Leyland E: cabinet override      05/27/21 0738 05/27/21 0854       Assessment/Plan: s/p Procedure(s): LAPAROSCOPIC RIGHT HEMI COLECTOMY (Right) Advance diet as tolerated Ambulate Ileus seems to be improving POD 4  LOS: 4 days    Autumn Messing III 05/31/2021

## 2021-06-01 LAB — GLUCOSE, CAPILLARY
Glucose-Capillary: 96 mg/dL (ref 70–99)
Glucose-Capillary: 110 mg/dL — ABNORMAL HIGH (ref 70–99)

## 2021-06-01 NOTE — Patient Outreach (Signed)
Received a referral from [facility] Liaison of [facility] hospital discharge.  I have assigned to Raina Mina, RN for complex care and disease management follow up calls and assess for further needs.

## 2021-06-01 NOTE — Discharge Summary (Signed)
[name] MRN: [id number] DOB/AGE: [birth year] 53 y.o.  Admit date: 05/27/2021 Discharge date: 06/01/2021  Discharge Diagnoses Patient Active Problem List   Diagnosis Date Noted   Colon cancer ([place]) 05/27/2021   Glaucoma suspect 05/27/2021   Serrated polyp of colon 04/02/2021   Hx of adenomatous colonic polyps 04/02/2021   Cecal polyp 04/02/2021   Abnormal colonoscopy 04/02/2021   Cough due to ACE inhibitor 03/05/2021   Obesity (BMI 30-39.9) 03/05/2021   Essential hypertension 12/25/2020   Prediabetes 12/25/2020   Perimenopausal 12/25/2020   Tobacco dependence 12/01/2020   Incontinence of feces 12/01/2020   Abdominal pain 12/01/2020    Consultants None  Procedures OR 05/27/21 - laparoscopic right hemicolectomy  Hospital Course: Her procedure went uneventfully.  She was admitted postoperatively and recovered reasonably well.  She did have a bit of a protracted ileus.  This gradually resolved.  She began having spontaneous bowel function.  On 06/01/2021, she was ambulating well on her own, pain well controlled, tolerating a diet, and having bowel movements.  She is comfortable with and stable for discharge home.    Allergies as of 06/01/2021       Reactions   Lisinopril Cough   Terbinafine And Related Hives        Medication List     STOP taking these medications    metroNIDAZOLE 500 MG tablet Commonly known as: FLAGYL   neomycin 500 MG tablet Commonly known as: MYCIFRADIN       TAKE these medications    amLODipine 5 MG tablet Commonly known as: NORVASC Take 1 tablet (5 mg total) by mouth daily.   bisacodyl 5 MG EC tablet Generic drug: bisacodyl Take 4 tablets (20 mg total) by mouth once daily as needed for constipation for up to 1 dose   metFORMIN 500 MG tablet Commonly known as: GLUCOPHAGE Take 1 tablet (500 mg total) by mouth 2 (two) times daily with a meal.   multivitamin with minerals tablet Take 1 tablet by mouth daily.  Centrum 50+   naproxen sodium 220 MG tablet Commonly known as: ALEVE Take 2 tablets (440 mg total) by mouth daily as needed (cramps).   nicotine 14 mg/24hr patch Commonly known as: NICODERM CQ - dosed in mg/24 hours Place 1 patch (14 mg total) onto the skin daily.   polyethylene glycol powder 17 GM/SCOOP powder Commonly known as: GLYCOLAX/MIRALAX Take 238 g by mouth once for 1 dose; take according to your procedure prep instructions.   polyethylene glycol powder 17 GM/SCOOP powder Commonly known as: GLYCOLAX/MIRALAX Take 238 g by mouth once for 1 dose Take according to your procedure prep instructions.   traMADol 50 MG tablet Commonly known as: Ultram Take 1 tablet (50 mg total) by mouth every 6 (six) hours as needed for up to 5 days (postop pain not controlled with tylenol and ibuprofen first).   valsartan 40 MG tablet Commonly known as: DIOVAN Take 1 tablet (40 mg total) by mouth daily. What changed: when to take this          Follow-up Information     Ileana Roup, MD Follow up in 2 week(s).   Specialties: General Surgery, Colon and Rectal Surgery Why: 2-3 weeks for postop appointment Contact information: [place] [address] [phone]                 Sharon Mt. Dema Severin, M.D. [facility]

## 2021-06-01 NOTE — Progress Notes (Signed)
Discharge instructions given to patient and all questions were answered.  

## 2021-06-01 NOTE — Consult Note (Signed)
° °  [facility] Inpatient Consult   06/01/2021  Shelby Wyatt October 02, 1965 [id number]   [facility] [ACO] Patient: [place] plan  Primary Care Provider:  Ladell Pier, MD  [facility]  Patient to be assigned to a [facility] RN Care Management for telephonic chronic disease management services. Unable to reach patient by phone currently.    Plan: Patient will be followed by [place] Coordinator for support follow up.   For additional questions or referrals please contact:   Natividad Brood, RN BSN [facility] Liaison  [phone] business mobile phone Toll free office [phone]  Fax number: [phone] [email or web address]

## 2021-06-01 NOTE — Progress Notes (Signed)
5 Days Post-Op   Subjective/Chief Complaint: Feels well. Tolerating diet, moving bowels, pain well controlled, ambulating, no complaints   Objective: Vital signs in last 24 hours: Temp:  [98.6 F (37 C)-99.1 F (37.3 C)] 98.6 F (37 C) (12/19 0506) Pulse Rate:  [67-78] 67 (12/19 0506) Resp:  [18] 18 (12/19 0506) BP: (126-133)/(74-76) 133/74 (12/19 0506) SpO2:  [97 %-100 %] 100 % (12/19 0506) Last BM Date: 05/31/21  Intake/Output from previous day: 12/18 0701 - 12/19 0700 In: 1920 [P.O.:1920] Out: 1200 [Urine:1200] Intake/Output this shift: Total I/O In: 240 [P.O.:240] Out: -   General appearance: alert and cooperative Resp: clear to auscultation bilaterally Cardio: regular rate and rhythm GI: soft, mild tenderness. Incisions ok  Lab Results:  Recent Labs    05/30/21 0511  WBC 10.0  HGB 13.6  HCT 41.0  PLT 275   BMET Recent Labs    05/30/21 0511  NA 134*  K 4.1  CL 99  CO2 26  GLUCOSE 127*  BUN 11  CREATININE 0.64  CALCIUM 9.0   PT/INR No results for input(s): LABPROT, INR in the last 72 hours. ABG No results for input(s): PHART, HCO3 in the last 72 hours.  Invalid input(s): PCO2, PO2  Studies/Results: DG Abd 1 View  Result Date: 05/30/2021 CLINICAL DATA:  Status post partial colectomy 3 days ago. Abdominal pain with nausea. EXAM: ABDOMEN - 1 VIEW COMPARISON:  CT chest abdomen and pelvis 04/16/2021. FINDINGS: There are new surgical changes/staple line within the right abdomen. There is no evidence for bowel obstruction. No dilated bowel loops are seen. No suspicious calcifications. There is body wall edema. No acute fractures are identified. IMPRESSION: 1. No evidence for bowel obstruction. 2. Surgical changes in the right abdomen. 3. Body wall edema. Electronically Signed   By: Ronney Asters M.D.   On: 05/30/2021 15:32    Anti-infectives: Anti-infectives (From admission, onward)    Start     Dose/Rate Route Frequency Ordered Stop   05/27/21 0745   cefoTEtan (CEFOTAN) 2 g in sodium chloride 0.9 % 100 mL IVPB        2 g 200 mL/hr over 30 Minutes Intravenous  Once 05/27/21 0743 05/27/21 0920   05/27/21 0738  sodium chloride 0.9 % with cefoTEtan (CEFOTAN) ADS Med       Note to Pharmacy: Kyra Leyland E: cabinet override      05/27/21 0738 05/27/21 0854       Assessment/Plan: s/p Procedure(s): LAPAROSCOPIC RIGHT HEMI COLECTOMY (Right) Advance diet as tolerated Ambulate Ileus resolved She is comfortable with and stable for discharge home today. We spent time answering questions, going over expectations and covering this to watch out for   LOS: 5 days    Shelby Wyatt 06/01/2021

## 2021-06-02 ENCOUNTER — Other Ambulatory Visit: Payer: Self-pay | Admitting: *Deleted

## 2021-06-02 ENCOUNTER — Telehealth: Payer: Self-pay

## 2021-06-02 LAB — SURGICAL PATHOLOGY

## 2021-06-02 NOTE — Telephone Encounter (Signed)
Call returned to patient and informed her, per Dr Wynetta Emery:  The nicotine patch will not interfere with her healing.  However if she feels she does not need it then she does not have to use it.    She said she understood and was very appreciative of the call back

## 2021-06-02 NOTE — Patient Outreach (Signed)
[name]) Care Management  06/02/2021  Shelby Wyatt Sep 17, 1965 [id number]  Transition of care call  Referral received:12/19 Initial outreach:12/20 Insurance: [employer]   Initial telephone call to patient, 2 HIPAA identifiers verified. Pt reports a good support system with her spouse. Pt verified she has sufficient transportation and all her ongoing medications. Pt states her appointments with Dr. Dema Severin in Jan and primary provider Dr. Wynetta Emery in Feb. Pt aware of who to call with any post op concerns. No other needs presented as pt indicates she has My Chart for viewing her providers information.  Pt inquired on how to request additional time with her FMLA. Encouraged pt to contact [place] benefit line and make this request. Pt has the contact number to make this request.  No ongoing care management needs identified as case will be closed.   Raina Mina, RN Care Management Coordinator [place] Office [phone]

## 2021-06-02 NOTE — Telephone Encounter (Signed)
Transition Care Management Follow-up Telephone Call Date of discharge and from where: 06/01/2021, [facility] How have you been since you were released from the hospital? She said she is doing okay, not too bad.  Any questions or concerns? Yes - she said she has not had a cigarette in 7 days and is not using the nicotine patch.  She said sometimes she craves a cigarette and other times the thought of the smell of a cigarette makes her sick.  She is not sure that she needs it.  Should she continue with the nicotine patch? She wants to make sure that it will not interfere with her healing after surgery.   Items Reviewed: Did the pt receive and understand the discharge instructions provided? Yes  Medications obtained and verified? Yes  Other? No  Any new allergies since your discharge? No  Do you have support at home? Yes   Home Care and Equipment/Supplies: Were home health services ordered? no If so, what is the name of the agency? N/a  Has the agency set up a time to come to the patient's home? not applicable Were any new equipment or medical supplies ordered?  No What is the name of the medical supply agency? N/a Were you able to get the supplies/equipment? not applicable Do you have any questions related to the use of the equipment or supplies? No  Functional Questionnaire: (I = Independent and D = Dependent) ADLs: independent. Has walker that she uses to help herself get up from the commode.    Follow up appointments reviewed:  PCP Hospital f/u appt confirmed? Yes  Scheduled to see Dr Wynetta Emery  on 07/21/2021.  She did not feel that she needs to be seen sooner.  [facility] f/u appt confirmed? Yes  Scheduled to see surgeon on 07/01/2021. Are transportation arrangements needed? No  If their condition worsens, is the pt aware to call PCP or go to the Emergency Dept.? Yes Was the patient provided with contact information for the PCP's office or ED? Yes Was to pt encouraged  to call back with questions or concerns? Yes

## 2021-06-11 LAB — SURGICAL PATHOLOGY

## 2021-06-17 ENCOUNTER — Telehealth: Payer: Self-pay | Admitting: Internal Medicine

## 2021-06-17 NOTE — Telephone Encounter (Signed)
I received pathology results from patient's right hemicolectomy.  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  I called patient today to make sure that she was aware of her diagnosis and has a follow-up appointment with the surgeon Dr. Dema Severin.  Patient states that she is aware and was told by Dr. Dema Severin that he was able to resect everything.  She has a follow-up appointment with him on the 18th of this month which she intends to keep.

## 2021-07-01 ENCOUNTER — Other Ambulatory Visit (HOSPITAL_COMMUNITY): Payer: Self-pay

## 2021-07-01 MED ORDER — HYDROCORTISONE 2.5 % EX CREA
1.0000 "application " | TOPICAL_CREAM | Freq: Two times a day (BID) | CUTANEOUS | 0 refills | Status: DC
  Filled 2021-07-01: qty 30, 15d supply, fill #0

## 2021-07-21 ENCOUNTER — Encounter: Payer: Self-pay | Admitting: Internal Medicine

## 2021-07-21 ENCOUNTER — Ambulatory Visit: Payer: 59 | Attending: Internal Medicine | Admitting: Internal Medicine

## 2021-07-21 VITALS — BP 124/77 | HR 76 | Resp 16 | Wt 184.6 lb

## 2021-07-21 DIAGNOSIS — Z23 Encounter for immunization: Secondary | ICD-10-CM | POA: Diagnosis not present

## 2021-07-21 DIAGNOSIS — R7303 Prediabetes: Secondary | ICD-10-CM | POA: Diagnosis not present

## 2021-07-21 DIAGNOSIS — E669 Obesity, unspecified: Secondary | ICD-10-CM | POA: Diagnosis not present

## 2021-07-21 DIAGNOSIS — Z85038 Personal history of other malignant neoplasm of large intestine: Secondary | ICD-10-CM

## 2021-07-21 DIAGNOSIS — F17200 Nicotine dependence, unspecified, uncomplicated: Secondary | ICD-10-CM | POA: Diagnosis not present

## 2021-07-21 DIAGNOSIS — I10 Essential (primary) hypertension: Secondary | ICD-10-CM

## 2021-07-21 NOTE — Progress Notes (Signed)
[name], female    DOB: Nov 27, 1965  MRN: [id number]  CC: Hypertension   Subjective: Shelby Wyatt is a 56 y.o. female who presents for chronic ds management.  Her husband is with her. 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  Procedure done by Dr. Dema Severin.  She has since had follow-up with him and has been released to return to work.  She went back to work yesterday.  HTN: Reports compliance with Diovan and Norvasc.  She limits salt in the foods.  No chest pains or shortness of breath at this time.  No lower extremity edema.  She checks blood pressure regularly.  The highest has been 140/76.  The majority of the readings are below 130/80.  Prediabetes/obesity: Lab Results  Component Value Date   HGBA1C 5.8 (H) 05/04/2021   She is supposed to be on metformin 500 mg twice a day.  However patient states she has been taking it just once a day because she does not remember to take it in the mornings.  Admits that she snacks on things like potato chips, cookies, peanut butter and crackers.  She sometimes also has yogurt.  She has been walking more now that she is healed from her abdominal surgery.  Tobacco dependence: On last visit we prescribed the 14 mg nicotine patches.  Patient states she had quit for period but restarted.  Currently smokes about 5 to 7 cigarettes a day.  She is still working on trying to quit.  She wears the nicotine patches while at work.   Patient Active Problem List   Diagnosis Date Noted   Colon cancer ([place]) 05/27/2021   Glaucoma suspect 05/27/2021   Serrated polyp of colon 04/02/2021   Hx of adenomatous colonic polyps 04/02/2021   Cecal polyp 04/02/2021   Abnormal colonoscopy 04/02/2021   Cough due to ACE inhibitor 03/05/2021   Obesity (BMI 30-39.9) 03/05/2021   Essential hypertension 12/25/2020    Prediabetes 12/25/2020   Perimenopausal 12/25/2020   Tobacco dependence 12/01/2020   Incontinence of feces 12/01/2020   Abdominal pain 12/01/2020     Current Outpatient Medications on File Prior to Visit  Medication Sig Dispense Refill   amLODipine (NORVASC) 5 MG tablet Take 1 tablet (5 mg total) by mouth daily. 30 tablet 1   bisacodyl 5 MG EC tablet Take 4 tablets (20 mg total) by mouth once daily as needed for constipation for up to 1 dose 100 tablet 0   hydrocortisone 2.5 % cream Apply 1 application topically 2 (two) times daily for 7 days as directed 30 g 0   metFORMIN (GLUCOPHAGE) 500 MG tablet Take 1 tablet (500 mg total) by mouth 2 (two) times daily with a meal. 180 tablet 0   Multiple Vitamins-Minerals (MULTIVITAMIN WITH MINERALS) tablet Take 1 tablet by mouth daily. Centrum 50+     naproxen sodium (ALEVE) 220 MG tablet Take 2 tablets (440 mg total) by mouth daily as needed (cramps). 30 tablet 0   nicotine (NICODERM CQ - DOSED IN MG/24 HOURS) 14 mg/24hr patch Place 1 patch (14 mg total) onto the skin daily. 28 patch 2   polyethylene glycol powder (GLYCOLAX/MIRALAX) 17 GM/SCOOP powder Take 238 g by mouth once for 1 dose; take according to your procedure prep instructions. 238 g 0   valsartan (DIOVAN) 40 MG tablet Take 1 tablet (40 mg total) by  mouth daily. (Patient taking differently: Take 40 mg by mouth at bedtime.) 90 tablet 3   No current facility-administered medications on file prior to visit.    Allergies  Allergen Reactions   Lisinopril Cough   Terbinafine And Related Hives    Social History   Socioeconomic History   Marital status: Married    Spouse name: Not on file   Number of children: 0   Years of education: Not on file   Highest education level: 12th grade  Occupational History   Occupation: Food services - [employer]  Tobacco Use   Smoking status: Former    Packs/day: 0.25    Types: Cigarettes    Quit date: 05/02/2021    Years since quitting: 0.2    Smokeless tobacco: Never  Vaping Use   Vaping Use: Never used  Substance and Sexual Activity   Alcohol use: Yes    Comment: occasionally   Drug use: Yes    Types: Marijuana    Comment: "once in a blue moon"   Sexual activity: Yes    Birth control/protection: None  Other Topics Concern   Not on file  Social History Narrative   Not on file   Social Determinants of Health   Financial Resource Strain: Not on file  Food Insecurity: Not on file  Transportation Needs: Not on file  Physical Activity: Not on file  Stress: Not on file  Social Connections: Not on file  Intimate Partner Violence: Not on file    Family History  Problem Relation Age of Onset   Hypertension Mother    Diabetes Mother    Breast cancer Mother    Hypertension Maternal Grandmother    Hypertension Paternal Grandmother    Hypertension Paternal Grandfather    Colon cancer Neg Hx    Esophageal cancer Neg Hx    Rectal cancer Neg Hx    Stomach cancer Neg Hx    Inflammatory bowel disease Neg Hx    Liver disease Neg Hx    Pancreatic cancer Neg Hx     Past Surgical History:  Procedure Laterality Date   COLONOSCOPY     COLONOSCOPY WITH PROPOFOL N/A 04/07/2021   Procedure: COLONOSCOPY WITH PROPOFOL;  Surgeon: Mansouraty, Telford Nab., MD;  Location: [facility];  Service: Gastroenterology;  Laterality: N/A;   ENDOSCOPIC MUCOSAL RESECTION N/A 04/07/2021   Procedure: ENDOSCOPIC MUCOSAL RESECTION;  Surgeon: Rush Landmark Telford Nab., MD;  Location: [facility];  Service: Gastroenterology;  Laterality: N/A;   HEMOSTASIS CLIP PLACEMENT  04/07/2021   Procedure: HEMOSTASIS CLIP PLACEMENT;  Surgeon: Irving Copas., MD;  Location: [facility];  Service: Gastroenterology;;   LAPAROSCOPIC RIGHT HEMI COLECTOMY Right 05/27/2021   Procedure: LAPAROSCOPIC RIGHT HEMI COLECTOMY;  Surgeon: Ileana Roup, MD;  Location: [facility];  Service: General;  Laterality: Right;   laporoscopy     SUBMUCOSAL LIFTING  INJECTION  04/07/2021   Procedure: SUBMUCOSAL LIFTING INJECTION;  Surgeon: Irving Copas., MD;  Location: [facility];  Service: Gastroenterology;;   TUBAL LIGATION     WISDOM TOOTH EXTRACTION      ROS: Review of Systems Negative except as stated above  PHYSICAL EXAM: BP 124/77    Pulse 76    Resp 16    Wt 184 lb 9.6 oz (83.7 kg)    SpO2 100%    BMI 33.76 kg/m   Wt Readings from Last 3 Encounters:  07/21/21 184 lb 9.6 oz (83.7 kg)  05/31/21 182 lb 1.6 oz (82.6 kg)  05/18/21 188  lb 1.6 oz (85.3 kg)    Physical Exam  General appearance - alert, well appearing, middle-aged African-American female and in no distress Mental status - normal mood, behavior, speech, dress, motor activity, and thought processes Neck - supple, no significant adenopathy Chest - clear to auscultation, no wheezes, rales or rhonchi, symmetric air entry Heart - normal rate, regular rhythm, normal S1, S2, no murmurs, rubs, clicks or gallops Extremities - peripheral pulses normal, no pedal edema, no clubbing or cyanosis   CMP Latest Ref Rng & Units 05/30/2021 05/29/2021 05/28/2021  Glucose 70 - 99 mg/dL 127(H) 106(H) 146(H)  BUN 6 - 20 mg/dL 11 10 8   Creatinine 0.44 - 1.00 mg/dL 0.64 0.57 0.63  Sodium 135 - 145 mmol/L 134(L) 137 135  Potassium 3.5 - 5.1 mmol/L 4.1 3.8 4.3  Chloride 98 - 111 mmol/L 99 103 106  CO2 22 - 32 mmol/L 26 25 23   Calcium 8.9 - 10.3 mg/dL 9.0 9.0 8.5(L)  Total Protein 6.0 - 8.5 g/dL - - -  Total Bilirubin 0.0 - 1.2 mg/dL - - -  Alkaline Phos 44 - 121 IU/L - - -  AST 0 - 40 IU/L - - -  ALT 0 - 32 IU/L - - -   Lipid Panel     Component Value Date/Time   CHOL 155 12/10/2020 0958   TRIG 60 12/10/2020 0958   HDL 52 12/10/2020 0958   CHOLHDL 3.0 12/10/2020 0958   LDLCALC 91 12/10/2020 0958    CBC    Component Value Date/Time   WBC 10.0 05/30/2021 0511   RBC 4.83 05/30/2021 0511   HGB 13.6 05/30/2021 0511   HGB 12.7 12/10/2020 0958   HCT 41.0 05/30/2021 0511    HCT 38.2 12/10/2020 0958   PLT 275 05/30/2021 0511   PLT 258 12/10/2020 0958   MCV 84.9 05/30/2021 0511   MCV 84 12/10/2020 0958   MCH 28.2 05/30/2021 0511   MCHC 33.2 05/30/2021 0511   RDW 15.2 05/30/2021 0511   RDW 14.7 12/10/2020 0958    ASSESSMENT AND PLAN:  1. Essential hypertension At goal.  Continue Norvasc and Diovan.  Continue low-salt diet.  2. Tobacco dependence Continue to encourage her to try to discontinue smoking.  She is aware of health risks associated with smoking.  She plans to continue using the nicotine patches.  3. Prediabetes 4. Obesity (BMI 30-39.9) Encouraged her to eat healthier snacks like fruits or nuts.  Continue walking for exercise at least 3 to 4 days a week.  Okay to continue metformin 500 mg once a day.  5. History of colon cancer   6. Need for Tdap vaccination She is due but we do not have the vaccine at this time.  We will schedule her to come back in about 2 weeks to see our clinical pharmacist to get the Tdap vaccine.   Patient was given the opportunity to ask questions.  Patient verbalized understanding of the plan and was able to repeat key elements of the plan.   No orders of the defined types were placed in this encounter.    Requested Prescriptions    No prescriptions requested or ordered in this encounter    Return in about 4 months (around 11/18/2021) for Give appt with [facility] in 2 wks for Tdapt.  Karle Plumber, MD, FACP

## 2021-08-27 ENCOUNTER — Encounter: Payer: 59 | Attending: Internal Medicine | Admitting: Dietician

## 2021-08-27 ENCOUNTER — Other Ambulatory Visit: Payer: Self-pay

## 2021-08-27 ENCOUNTER — Encounter: Payer: Self-pay | Admitting: Dietician

## 2021-08-27 VITALS — Ht 62.0 in | Wt 184.2 lb

## 2021-08-27 DIAGNOSIS — R7303 Prediabetes: Secondary | ICD-10-CM | POA: Insufficient documentation

## 2021-08-27 NOTE — Progress Notes (Signed)
Medical Nutrition Therapy  ?Appointment Start time:  1620  Appointment End time:  1650 ? ?Primary concerns today: Prediabetes  ?Referral diagnosis: R73.03 - Prediabetes ?Preferred learning style: No preference indicated ?Learning readiness: Ready ? ? ?NUTRITION ASSESSMENT  ? ?Anthropometrics  ?Ht: 5'2" ?Wt: 184.2 lbs ?Wt Change: - 4 lbs ?Body mass index is 33.69 kg/m?. ? ? ?Clinical ?Medical Hx: HTN, Prediabetes, Tobacco use, NEW: Hemicolectomy ?Medications: Lisinopril, metformin, Valsartan, Nicoderm patch ?Labs: A1c - 5.8 (05/04/2021) ?Notable Signs/Symptoms: N/A ? ? ?Lifestyle & Dietary Hx ?Pt reports occasionally forgetting to take their morning dose of metformin occasionally. ?Pt had a portion of their colon removed in January, pt states all of the cancer was removed and did not spread. Pt is experiencing frequent loose stools, is not taking fiber supplement. ?Pt reports eating salads more, fruit smoothies, and whole wheat bread. 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 ?Pt is up to drinking around 40 oz. of water a day now. ?Pt reports improvement in blood pressure now, no longer experiencing dizziness. ?Pt reports occasional tingling in their feet, but no burning sensations.  ? ?Estimated daily fluid intake: 50-60 oz ?Supplements: Centrum silver women's One-A-Day ?Sleep: Not great, has difficulty falling asleep occasionally ?Stress / self-care: Marital conflicts ?Current average weekly physical activity: ADLs, walks dog, dances ? ? ?24-Hr Dietary Recall ?Snack: Honey wheat Bagel w/ strawberry cream cheese ?First Meal: pancakes, bacon ?Snack:  ?Second Meal:Chicken pesto pizza  ?Snack: none ?Third Meal: 2 beef kielbasa links on whole wheat bread. ?Snack:  ?Beverages: water, ginger ale ? ? ?NUTRITION DIAGNOSIS  ?NB-1.1 Food and nutrition-related knowledge deficit As related to prediabetes.  As evidenced by A1c of 6.0, skipping lunch, and self-reported over  consumption at meals. ? ? ?NUTRITION INTERVENTION  ?Nutrition education (E-1) on the following topics:  ?Educated patient on the pathophysiology of diabetes. This includes why our bodies need circulating blood sugar, the relationship between insulin and blood sugar, and the results of insulin resistance and/or pancreatic insufficiency on the development of diabetes. Educated patient on factors that contribute to elevation of blood sugars, such as stress, illness, injury,and food choices. Discussed the role that physical activity plays in lowering blood sugar. Educate patient on the three main macronutrients. Protein, fats, and carbohydrates. Discussed how each of these macronutrients affect blood sugar levels, especially carbohydrate, and the importance of eating a consistent amount of carbohydrate throughout the day.  ? ?Handouts Provided Include  ?Balanced Plate ?Balanced Plate Food List ?Fiber-Restricted (13 grams) Nutrition Therapy Nutrition Care Manual ? ?Learning Style & Readiness for Change ?Teaching method utilized: Visual & Auditory  ?Demonstrated degree of understanding via: Teach Back  ?Barriers to learning/adherence to lifestyle change: None ? ?Goals Established by Pt ?If you are having pizza for lunch, add a side salad to balance the meal out! ?Begin to take your Metamucil at the same time each day. Start taking after dinner each night when you take your metformin. ?Consider adding a "rehydration packet" to your water once daily. ? ? ?MONITORING & EVALUATION ?Dietary intake, weekly physical activity, and meal frequency in 3 months. ? ?Next Steps  ?Patient is to follow up with RD. ? ?

## 2021-08-27 NOTE — Patient Instructions (Addendum)
If you are having pizza for lunch, add a side salad to balance the meal out! ? ?Begin to take your Metamucil at the same time each day. Start taking after dinner each night when you take your metformin. ? ?Consider adding a "rehydration packet" to your water once daily. ? ?

## 2021-09-11 ENCOUNTER — Other Ambulatory Visit (HOSPITAL_COMMUNITY): Payer: Self-pay

## 2021-09-11 ENCOUNTER — Other Ambulatory Visit: Payer: Self-pay | Admitting: Internal Medicine

## 2021-09-11 MED ORDER — AMLODIPINE BESYLATE 5 MG PO TABS
5.0000 mg | ORAL_TABLET | Freq: Every day | ORAL | 2 refills | Status: DC
  Filled 2021-09-11: qty 30, 30d supply, fill #0

## 2021-09-11 NOTE — Telephone Encounter (Signed)
Pt called in, is all out of amlodipine. She is asksingfor short supply to be sent to, ?[facility] Phone:  [phone]  ?Fax:  [phone]  ?  ? ?

## 2021-09-11 NOTE — Telephone Encounter (Signed)
Pt called in to check on the status of this Rx request. FYI ?

## 2021-10-12 ENCOUNTER — Other Ambulatory Visit (HOSPITAL_COMMUNITY): Payer: Self-pay

## 2021-10-12 ENCOUNTER — Encounter: Payer: Self-pay | Admitting: Internal Medicine

## 2021-10-12 ENCOUNTER — Ambulatory Visit: Payer: Self-pay

## 2021-10-12 ENCOUNTER — Ambulatory Visit: Payer: 59 | Attending: Internal Medicine | Admitting: Internal Medicine

## 2021-10-12 VITALS — BP 144/83 | HR 99 | Resp 18 | Ht 62.0 in | Wt 185.0 lb

## 2021-10-12 DIAGNOSIS — R7303 Prediabetes: Secondary | ICD-10-CM

## 2021-10-12 DIAGNOSIS — M7022 Olecranon bursitis, left elbow: Secondary | ICD-10-CM

## 2021-10-12 DIAGNOSIS — I10 Essential (primary) hypertension: Secondary | ICD-10-CM | POA: Diagnosis not present

## 2021-10-12 DIAGNOSIS — R200 Anesthesia of skin: Secondary | ICD-10-CM

## 2021-10-12 LAB — POCT GLYCOSYLATED HEMOGLOBIN (HGB A1C): Hemoglobin A1C: 6.4 % — AB (ref 4.0–5.6)

## 2021-10-12 LAB — GLUCOSE, POCT (MANUAL RESULT ENTRY): POC Glucose: 86 mg/dl (ref 70–99)

## 2021-10-12 MED ORDER — AMLODIPINE BESYLATE 10 MG PO TABS
10.0000 mg | ORAL_TABLET | Freq: Every day | ORAL | 6 refills | Status: DC
Start: 2021-10-12 — End: 2022-06-16
  Filled 2021-10-12: qty 30, 30d supply, fill #0
  Filled 2021-11-23: qty 90, 90d supply, fill #1
  Filled 2022-04-06: qty 90, 90d supply, fill #2

## 2021-10-12 NOTE — Telephone Encounter (Signed)
?  Chief Complaint: bilateral hand numbness ?Symptoms: left elbow mild swelling, intermittent hand  numbness and tingling ?Frequency: daily for 3 weeks ?Pertinent Negatives: Patient denies neck pain, rash fever ?Disposition: '[]'$ ED /'[]'$ Urgent Care (no appt availability in office) / '[x]'$ Appointment(In office/virtual)/ '[]'$  [facility]/ '[]'$ Home Care/ '[]'$ Refused Recommended Disposition /'[]'$ [facility]/ '[]'$  Follow-up with PCP ?Additional Notes: appt today with PCP ? ? ? ? ?Reason for Disposition ? Numbness (i.e., loss of sensation) in hand or fingers (Exception: just tingling; numbness present > 2 weeks) ? ?Answer Assessment - Initial Assessment Questions ?1. ONSET: "When did the pain start?" ?    Few weeks ?2. LOCATION: "Where is the pain located?" ?    Numbness to hands fingers ?3. PAIN: "How bad is the pain?" (Scale 1-10; or mild, moderate, severe) ?  - MILD (1-3): doesn't interfere with normal activities ?  - MODERATE (4-7): interferes with normal activities (e.g., work or school) or awakens from sleep ?  - SEVERE (8-10): excruciating pain, unable to use hand at all ?    moderate ?4. WORK OR EXERCISE: "Has there been any recent work or exercise that involved this part (i.e., hand or wrist) of the body?" ?    Left elbow with swelling ?5. CAUSE: "What do you think is causing the pain?" ?    unsure ?6. AGGRAVATING FACTORS: "What makes the pain worse?" (e.g., using computer) ?    Any time comes and goess nd happening more oftern ?7. OTHER SYMPTOMS: "Do you have any other symptoms?" (e.g., neck pain, swelling, rash, numbness, fever) ?    numbness ?8. PREGNANCY: "Is there any chance you are pregnant?" "When was your last menstrual period?" ?    N/a ? ?Protocols used: Hand and Wrist Pain-A-AH ? ?

## 2021-10-12 NOTE — Progress Notes (Signed)
? ? ?[name], female    DOB: 28-Jul-1965  MRN: [id number] ? ?CC: Numbness (Tingling and numbness in hands) ? ? ?Subjective: ?Shelby Wyatt is a 56 y.o. female who presents for UC ?Her concerns today include:  ?Tob dep, fecal incont, HTN, preDM, obesity, colon CA (s/p RT hemicolectomy 05/2021) ? ?C/o painless swelling over LT olecranon  process x 1 mth ?No initiating factors ?Iced it and it has decreased in size significantly.   ? ?C/o numbness and tingling in the finger tips of the second through the fourth fingers x 1 mth.  Left hand is worse than the right.  Constant at times.  Gets numbness in the hands if she presses down on the palm of the hands to get up from a seated position.  Numbness in the entire hand if she sleeps on her arm ?Some lifting at work less than 50 lbs ?She notices intermittent shooting pain from LT elbow down the forearm to middle finger with lifting or certain movements ?Little weakness in grip but not a lot ?A1C today 6.4.  Taking Metformin 500 mg.  "I eat everything.  I slacked off.  I need to start eating healthy again - chicken, pork chop, hot dogs."  Walking but not as often as she should.  Waiting for her tennis shoes to come 10/15/2021.  Has plantar faciitis. ? ?HTN:  BP elev today.   ?Reports compliance with Norvasc and Diovan ?Checks BP daily but does not have log.  Some readings 130-140/70-80 ?Patient Active Problem List  ? Diagnosis Date Noted  ? Colon cancer ([place]) 05/27/2021  ? Glaucoma suspect 05/27/2021  ? Serrated polyp of colon 04/02/2021  ? Hx of adenomatous colonic polyps 04/02/2021  ? Cecal polyp 04/02/2021  ? Abnormal colonoscopy 04/02/2021  ? Cough due to ACE inhibitor 03/05/2021  ? Obesity (BMI 30-39.9) 03/05/2021  ? Essential hypertension 12/25/2020  ? Prediabetes 12/25/2020  ? Perimenopausal 12/25/2020  ? Tobacco dependence 12/01/2020  ? Incontinence of feces 12/01/2020  ? Abdominal pain 12/01/2020  ?  ? ?Current Outpatient Medications on File  Prior to Visit  ?Medication Sig Dispense Refill  ? amLODipine (NORVASC) 5 MG tablet Take 1 tablet (5 mg total) by mouth daily. 30 tablet 2  ? hydrocortisone 2.5 % cream Apply 1 application topically 2 (two) times daily for 7 days as directed 30 g 0  ? metFORMIN (GLUCOPHAGE) 500 MG tablet Take 1 tablet (500 mg total) by mouth 2 (two) times daily with a meal. 180 tablet 0  ? Multiple Vitamins-Minerals (MULTIVITAMIN WITH MINERALS) tablet Take 1 tablet by mouth daily. Centrum 50+    ? naproxen sodium (ALEVE) 220 MG tablet Take 2 tablets (440 mg total) by mouth daily as needed (cramps). 30 tablet 0  ? nicotine (NICODERM CQ - DOSED IN MG/24 HOURS) 14 mg/24hr patch Place 1 patch (14 mg total) onto the skin daily. 28 patch 2  ? valsartan (DIOVAN) 40 MG tablet Take 1 tablet (40 mg total) by mouth daily. (Patient taking differently: Take 40 mg by mouth at bedtime.) 90 tablet 3  ? bisacodyl 5 MG EC tablet Take 4 tablets (20 mg total) by mouth once daily as needed for constipation for up to 1 dose (Patient not taking: Reported on 10/12/2021) 100 tablet 0  ? polyethylene glycol powder (GLYCOLAX/MIRALAX) 17 GM/SCOOP powder Take 238 g by mouth once for 1 dose; take according to your procedure prep instructions. (Patient not taking: Reported on 10/12/2021) 238 g 0  ? ?  No current facility-administered medications on file prior to visit.  ? ? ?Allergies  ?Allergen Reactions  ? Lisinopril Cough  ? Terbinafine And Related Hives  ? ? ?Social History  ? ?Socioeconomic History  ? Marital status: Married  ?  Spouse name: Not on file  ? Number of children: 0  ? Years of education: Not on file  ? Highest education level: 12th grade  ?Occupational History  ? Occupation: [employer]  ?Tobacco Use  ? Smoking status: Every Day  ?  Packs/day: 0.25  ?  Types: Cigarettes  ?  Last attempt to quit: 05/02/2021  ?  Years since quitting: 0.4  ? Smokeless tobacco: Never  ?Vaping Use  ? Vaping Use: Never used  ?Substance and Sexual Activity  ?  Alcohol use: Yes  ?  Comment: occasionally  ? Drug use: Yes  ?  Types: Marijuana  ?  Comment: "once in a blue moon"  ? Sexual activity: Yes  ?  Birth control/protection: None  ?Other Topics Concern  ? Not on file  ?Social History Narrative  ? Not on file  ? ?Social Determinants of Health  ? ?Financial Resource Strain: Not on file  ?Food Insecurity: Not on file  ?Transportation Needs: Not on file  ?Physical Activity: Not on file  ?Stress: Not on file  ?Social Connections: Not on file  ?Intimate Partner Violence: Not on file  ? ? ?Family History  ?Problem Relation Age of Onset  ? Hypertension Mother   ? Diabetes Mother   ? Breast cancer Mother   ? Hypertension Maternal Grandmother   ? Hypertension Paternal Grandmother   ? Hypertension Paternal Grandfather   ? Colon cancer Neg Hx   ? Esophageal cancer Neg Hx   ? Rectal cancer Neg Hx   ? Stomach cancer Neg Hx   ? Inflammatory bowel disease Neg Hx   ? Liver disease Neg Hx   ? Pancreatic cancer Neg Hx   ? ? ?Past Surgical History:  ?Procedure Laterality Date  ? COLONOSCOPY    ? COLONOSCOPY WITH PROPOFOL N/A 04/07/2021  ? Procedure: COLONOSCOPY WITH PROPOFOL;  Surgeon: Mansouraty, Telford Nab., MD;  Location: [facility];  Service: Gastroenterology;  Laterality: N/A;  ? ENDOSCOPIC MUCOSAL RESECTION N/A 04/07/2021  ? Procedure: ENDOSCOPIC MUCOSAL RESECTION;  Surgeon: Rush Landmark Telford Nab., MD;  Location: [facility];  Service: Gastroenterology;  Laterality: N/A;  ? HEMOSTASIS CLIP PLACEMENT  04/07/2021  ? Procedure: HEMOSTASIS CLIP PLACEMENT;  Surgeon: Irving Copas., MD;  Location: [facility];  Service: Gastroenterology;;  ? LAPAROSCOPIC RIGHT HEMI COLECTOMY Right 05/27/2021  ? Procedure: LAPAROSCOPIC RIGHT HEMI COLECTOMY;  Surgeon: Ileana Roup, MD;  Location: [facility];  Service: General;  Laterality: Right;  ? laporoscopy    ? SUBMUCOSAL LIFTING INJECTION  04/07/2021  ? Procedure: SUBMUCOSAL LIFTING INJECTION;  Surgeon: Irving Copas., MD;   Location: [facility];  Service: Gastroenterology;;  ? TUBAL LIGATION    ? WISDOM TOOTH EXTRACTION    ? ? ?ROS: ?Review of Systems ?Negative except as stated above ? ?PHYSICAL EXAM: ?BP (!) 144/83 (BP Location: Left Arm, Patient Position: Sitting, Cuff Size: Normal)   Pulse 99   Resp 18   Ht '5\' 2"'$  (1.575 m)   Wt 185 lb (83.9 kg)   SpO2 100%   BMI 33.84 kg/m?   ?Physical Exam ?145/87 ?General appearance - alert, well appearing, and in no distress ?Mental status - normal mood, behavior, speech, dress, motor activity, and thought processes ?Neurological -no  wasting in the intrinsic muscles of the hands.  Grip 5/5 bilaterally.  Gross sensation intact in the hands.  Tinel's sign negative. ? ? ?  Latest Ref Rng & Units 05/30/2021  ?  5:11 AM 05/29/2021  ?  4:27 AM 05/28/2021  ?  4:21 AM  ?CMP  ?Glucose 70 - 99 mg/dL 127   106   146    ?BUN 6 - 20 mg/dL '11   10   8    '$ ?Creatinine 0.44 - 1.00 mg/dL 0.64   0.57   0.63    ?Sodium 135 - 145 mmol/L 134   137   135    ?Potassium 3.5 - 5.1 mmol/L 4.1   3.8   4.3    ?Chloride 98 - 111 mmol/L 99   103   106    ?CO2 22 - 32 mmol/L '26   25   23    '$ ?Calcium 8.9 - 10.3 mg/dL 9.0   9.0   8.5    ? ?Lipid Panel  ?   ?Component Value Date/Time  ? CHOL 155 12/10/2020 0958  ? TRIG 60 12/10/2020 0958  ? HDL 52 12/10/2020 0958  ? CHOLHDL 3.0 12/10/2020 0958  ? [place] 91 12/10/2020 0958  ? ? ?CBC ?   ?Component Value Date/Time  ? WBC 10.0 05/30/2021 0511  ? RBC 4.83 05/30/2021 0511  ? HGB 13.6 05/30/2021 0511  ? HGB 12.7 12/10/2020 0958  ? HCT 41.0 05/30/2021 0511  ? HCT 38.2 12/10/2020 0958  ? PLT 275 05/30/2021 0511  ? PLT 258 12/10/2020 0958  ? MCV 84.9 05/30/2021 0511  ? MCV 84 12/10/2020 0958  ? MCH 28.2 05/30/2021 0511  ? MCHC 33.2 05/30/2021 0511  ? RDW 15.2 05/30/2021 0511  ? RDW 14.7 12/10/2020 0958  ? ?Results for orders placed or performed in visit on 10/12/21  ?POCT glucose (manual entry)  ?Result Value Ref Range  ? POC Glucose 86 70 - 99 mg/dl  ?POCT A1C  ?Result Value Ref  Range  ? Hemoglobin A1C 6.4 (A) 4.0 - 5.6 %  ? HbA1c POC (<> result, manual entry)    ? HbA1c, POC (prediabetic range)    ? HbA1c, POC (controlled diabetic range)    ? ? ?ASSESSMENT AND PLAN: ?1. Numbness of finge

## 2021-10-12 NOTE — Patient Instructions (Signed)
Increase Norvasc to 10 mg daily.

## 2021-11-18 ENCOUNTER — Ambulatory Visit: Payer: 59 | Admitting: Dietician

## 2021-11-23 ENCOUNTER — Other Ambulatory Visit (HOSPITAL_COMMUNITY): Payer: Self-pay

## 2021-12-07 ENCOUNTER — Encounter: Payer: 59 | Attending: Physician Assistant | Admitting: Registered"

## 2021-12-07 ENCOUNTER — Encounter: Payer: Self-pay | Admitting: Registered"

## 2021-12-07 DIAGNOSIS — R7303 Prediabetes: Secondary | ICD-10-CM | POA: Diagnosis not present

## 2021-12-07 NOTE — Progress Notes (Signed)
Medical Nutrition Therapy follow-up Appointment Start time:  [phone]  Appointment End time:  1720  Primary concerns today: Prediabetes  Referral diagnosis: R73.03 - Prediabetes Preferred learning style: No preference indicated Learning readiness: Ready   NUTRITION ASSESSMENT   Anthropometrics  Ht: 5'2" Wt: 187.10 lbs Wt Change: +3 lbs Body mass index is 34.22 kg/m.  Clinical Medical Hx: HTN, Prediabetes, Tobacco use, Hemicolectomy Medications: Lisinopril, metformin takes 1x day, Valsartan, Nicoderm patch Labs: A1c - 5.8% (05/04/2021) - 6.4% 10/12/2021 Notable Signs/Symptoms: N/A  Lifestyle & Dietary Hx Pt reports only takes Metformin 500 1x day, likely enough medication Pt will discuss with MD and if needs more may be able to take 1000 mg with dinner, instead of 500 bid.  Pt states she recently has returned from vacation in [place] and has regained her weight. Pt reports enjoying herself on the trip.  Pt states she has not continued drinking metamucil, doesn't like drinking it even though the flavor is not bad. Wants to take the pill form.  Work is usually 6 am- 3:30 pm. Works in [employer], mostly in the salad bar area, sometimes on the grill. Pt states she tries to limit sodium intake. Pt states she doesn't eat before work, is getting tired of the breakfast food at work, usually just has a little snack before lunch. Pt states sometimes has a muffin for a snack, enjoys triscuits, 1-2 handfuls.  Estimated daily fluid intake: 33-60 oz Supplements: Centrum silver women's One-A-Day Sleep: still getting hot flashes, sleep with fan on. 6-7 hrs Stress / self-care: Marital conflicts Current average weekly physical activity: ADLs, walks dog, dances  24-Hr Dietary Recall Snack:  First Meal:  Snack: 1 chicken tender Second Meal: [place] sandwich, cheese, lettuce, honey mustard, mayo 1-1/2 T. Lays bbq potato chips Snack: none Third Meal: chicken burrito, salad, sour cream, cheese,  onions Snack:  Beverages: water, ginger ale 2x/month   NUTRITION DIAGNOSIS  NB-1.1 Food and nutrition-related knowledge deficit As related to prediabetes.  As evidenced by A1c of 6.0, skipping lunch, and self-reported over consumption at meals.   NUTRITION INTERVENTION  Nutrition education (E-1) on the following topics:  Exercise, types and intensity Reviewed carb / protein balance Protein needed for muscles with aging   Handouts Provided Include  none  Learning Style & Readiness for Change Teaching method utilized: Visual & Auditory  Demonstrated degree of understanding via: Teach Back  Barriers to learning/adherence to lifestyle change: None  Goals Established by Pt Start having oatmeal 2 times per week, include protein such as an egg or greek yogurt. Aim for 20 grams of program with breakfast and dinner, this will help with building muscle mass when you are doing resistance exercises.  Great job on adding more salads to your meals to get more balance  Exercise: get back in your routine of walking 3x week during lunch break, push yourself to get your heart rate up to moderate intensity. Work toward strength exercises 2x week and also some stretching. Water aerobics is a good idea.  Consider talking to your gyn MD again about your hot flashes since they are disrupting your sleep.  Talk to doctor about taking pill form of Metamucil instead of powder since you are not liking drinking it daily.   MONITORING & EVALUATION Dietary intake, weekly physical activity, and meal frequency in 3 months.  Next Steps  Patient is to follow up with RD in 2 months

## 2021-12-14 ENCOUNTER — Ambulatory Visit (HOSPITAL_COMMUNITY)
Admission: EM | Admit: 2021-12-14 | Discharge: 2021-12-14 | Disposition: A | Discharge status: Home / Self Care | Payer: 59 | Attending: Emergency Medicine | Admitting: Emergency Medicine

## 2021-12-14 ENCOUNTER — Encounter (HOSPITAL_COMMUNITY): Payer: Self-pay | Admitting: Emergency Medicine

## 2021-12-14 ENCOUNTER — Other Ambulatory Visit (HOSPITAL_COMMUNITY): Payer: Self-pay

## 2021-12-14 DIAGNOSIS — M545 Low back pain, unspecified: Secondary | ICD-10-CM

## 2021-12-14 MED ORDER — CYCLOBENZAPRINE HCL 10 MG PO TABS
10.0000 mg | ORAL_TABLET | Freq: Every day | ORAL | 0 refills | Status: DC
  Filled 2021-12-14: qty 10, 10d supply, fill #0

## 2021-12-14 MED ORDER — KETOROLAC TROMETHAMINE 30 MG/ML IJ SOLN
INTRAMUSCULAR | Status: AC
  Filled 2021-12-14: qty 1

## 2021-12-14 MED ORDER — KETOROLAC TROMETHAMINE 30 MG/ML IJ SOLN
30.0000 mg | Freq: Once | INTRAMUSCULAR | Status: AC
  Administered 2021-12-14: 30 mg via INTRAMUSCULAR

## 2021-12-14 NOTE — ED Provider Notes (Signed)
[facility]    CSN: [id number] Arrival date & time: 12/14/21  5427      History   Chief Complaint Chief Complaint  Patient presents with   Back Pain    HPI Shelby Wyatt is a 56 y.o. female.   Patient presents with bilateral lower back pain beginning 1 day ago while at work.  Endorses that she lifts pulls and pushes objects on a daily basis.  Range of motion is intact but elicits pain.  Pain does not radiate, rated a 9 out of 10, described as sharp.  Has attempted use of Aleve which has been ineffective.  Denies numbness or tingling, urinary or bowel incontinence.    Past Medical History:  Diagnosis Date   Allergy    Hypertension    No pertinent past medical history    Pre-diabetes    Tubal pregnancy     Patient Active Problem List   Diagnosis Date Noted   Numbness of fingers of both hands 10/12/2021   Colon cancer ([place]) 05/27/2021   Glaucoma suspect 05/27/2021   Serrated polyp of colon 04/02/2021   Hx of adenomatous colonic polyps 04/02/2021   Cecal polyp 04/02/2021   Abnormal colonoscopy 04/02/2021   Cough due to ACE inhibitor 03/05/2021   Obesity (BMI 30-39.9) 03/05/2021   Essential hypertension 12/25/2020   Prediabetes 12/25/2020   Perimenopausal 12/25/2020   Tobacco dependence 12/01/2020   Incontinence of feces 12/01/2020   Abdominal pain 12/01/2020    Past Surgical History:  Procedure Laterality Date   COLONOSCOPY     COLONOSCOPY WITH PROPOFOL N/A 04/07/2021   Procedure: COLONOSCOPY WITH PROPOFOL;  Surgeon: Irving Copas., MD;  Location: [facility];  Service: Gastroenterology;  Laterality: N/A;   ENDOSCOPIC MUCOSAL RESECTION N/A 04/07/2021   Procedure: ENDOSCOPIC MUCOSAL RESECTION;  Surgeon: Rush Landmark Telford Nab., MD;  Location: [facility];  Service: Gastroenterology;  Laterality: N/A;   HEMOSTASIS CLIP PLACEMENT  04/07/2021   Procedure: HEMOSTASIS CLIP PLACEMENT;  Surgeon: Irving Copas., MD;  Location: [facility];  Service: Gastroenterology;;   LAPAROSCOPIC RIGHT HEMI COLECTOMY Right 05/27/2021   Procedure: LAPAROSCOPIC RIGHT HEMI COLECTOMY;  Surgeon: Ileana Roup, MD;  Location: [facility];  Service: General;  Laterality: Right;   laporoscopy     SUBMUCOSAL LIFTING INJECTION  04/07/2021   Procedure: SUBMUCOSAL LIFTING INJECTION;  Surgeon: Irving Copas., MD;  Location: [facility];  Service: Gastroenterology;;   TUBAL LIGATION     WISDOM TOOTH EXTRACTION      OB History     Gravida  2   Para      Term      Preterm      AB  2   Living         SAB      IAB      Ectopic  2   Multiple      Live Births               Home Medications    Prior to Admission medications   Medication Sig Start Date End Date Taking? Authorizing Provider  amLODipine (NORVASC) 10 MG tablet Take 1 tablet (10 mg total) by mouth daily. 10/12/21   Ladell Pier, MD  bisacodyl 5 MG EC tablet Take 4 tablets (20 mg total) by mouth once daily as needed for constipation for up to 1 dose Patient not taking: Reported on 10/12/2021 04/27/21     hydrocortisone 2.5 % cream Apply 1 application topically 2 (two)  times daily for 7 days as directed 07/01/21     metFORMIN (GLUCOPHAGE) 500 MG tablet Take 1 tablet (500 mg total) by mouth 2 (two) times daily with a meal. 05/06/21   Ladell Pier, MD  Multiple Vitamins-Minerals (MULTIVITAMIN WITH MINERALS) tablet Take 1 tablet by mouth daily. Centrum 50+    [provider]  naproxen sodium (ALEVE) 220 MG tablet Take 2 tablets (440 mg total) by mouth daily as needed (cramps). 04/21/21   Mansouraty, Telford Nab., MD  nicotine (NICODERM CQ - DOSED IN MG/24 HOURS) 14 mg/24hr patch Place 1 patch (14 mg total) onto the skin daily. 04/02/21   Ladell Pier, MD  polyethylene glycol powder (GLYCOLAX/MIRALAX) 17 GM/SCOOP powder Take 238 g by mouth once for 1 dose; take according to your procedure prep instructions. Patient not taking:  Reported on 10/12/2021 04/27/21     valsartan (DIOVAN) 40 MG tablet Take 1 tablet (40 mg total) by mouth daily. Patient taking differently: Take 40 mg by mouth at bedtime. 03/05/21   Ladell Pier, MD    Family History Family History  Problem Relation Age of Onset   Hypertension Mother    Diabetes Mother    Breast cancer Mother    Hypertension Maternal Grandmother    Hypertension Paternal Grandmother    Hypertension Paternal Grandfather    Colon cancer Neg Hx    Esophageal cancer Neg Hx    Rectal cancer Neg Hx    Stomach cancer Neg Hx    Inflammatory bowel disease Neg Hx    Liver disease Neg Hx    Pancreatic cancer Neg Hx     Social History Social History   Tobacco Use   Smoking status: Every Day    Packs/day: 0.25    Types: Cigarettes    Last attempt to quit: 05/02/2021    Years since quitting: 0.6   Smokeless tobacco: Never  Vaping Use   Vaping Use: Never used  Substance Use Topics   Alcohol use: Yes    Comment: occasionally   Drug use: Yes    Types: Marijuana    Comment: "once in a blue moon"     Allergies   Lisinopril and Terbinafine and related   Review of Systems Review of Systems  Constitutional: Negative.   Respiratory: Negative.    Cardiovascular: Negative.   Musculoskeletal:  Positive for back pain. Negative for arthralgias, gait problem, joint swelling, myalgias, neck pain and neck stiffness.  Skin: Negative.   Neurological: Negative.      Physical Exam Triage Vital Signs ED Triage Vitals  Enc Vitals Group     BP 12/14/21 0918 124/74     Pulse Rate 12/14/21 0918 64     Resp 12/14/21 0918 20     Temp 12/14/21 0918 98.3 F (36.8 C)     Temp Source 12/14/21 0918 Oral     SpO2 12/14/21 0918 97 %     Weight --      Height --      Head Circumference --      Peak Flow --      Pain Score 12/14/21 0917 9     Pain Loc --      Pain Edu? --      Excl. in [place]? --    No data found.  Updated Vital Signs BP 124/74 (BP Location: Left Arm)    Pulse 64   Temp 98.3 F (36.8 C) (Oral)   Resp 20   SpO2 97%  Visual Acuity Right Eye Distance:   Left Eye Distance:   Bilateral Distance:    Right Eye Near:   Left Eye Near:    Bilateral Near:     Physical Exam Constitutional:      Appearance: Normal appearance.  HENT:     Head: Normocephalic.  Eyes:     Extraocular Movements: Extraocular movements intact.  Pulmonary:     Effort: Pulmonary effort is normal.  Musculoskeletal:     Comments: Tenderness is present over the bilateral lumbar region without ecchymosis, swelling or deformity, no spinal tenderness noted  Skin:    General: Skin is warm and dry.  Neurological:     Mental Status: She is alert and oriented to person, place, and time. Mental status is at baseline.  Psychiatric:        Mood and Affect: Mood normal.        Behavior: Behavior normal.      UC Treatments / Results  Labs (all labs ordered are listed, but only abnormal results are displayed) Labs Reviewed - No data to display  EKG   Radiology No results found.  Procedures Procedures (including critical care time)  Medications Ordered in UC Medications - No data to display  Initial Impression / Assessment and Plan / UC Course  I have reviewed the triage vital signs and the nursing notes.  Pertinent labs & imaging results that were available during my care of the patient were reviewed by me and considered in my medical decision making (see chart for details).  Acute bilateral low back pain without sciatica  Etiology is most likely muscular, discussed with patient, Toradol injection given in office and recommended distant use of Aleve for the next 5 days then as needed, may use Tylenol in addition, prescribed Flexeril for bedtime, recommended RICE, heat, massage, daily stretching and activity as tolerated, work note given, given walker referral to orthopedics if symptoms persist past 2 weeks or worse Final Clinical Impressions(s) / UC  Diagnoses   Final diagnoses:  None   Discharge Instructions   None    ED Prescriptions   None    PDMP not reviewed this encounter.   Hans Eden, [place] 12/14/21 [phone]

## 2021-12-14 NOTE — Discharge Instructions (Addendum)
Your pain is most likely caused by irritation to the muscles.  You have been given an injection of Toradol here in the office which helps to reduce the inflammatory process that occurs with muscle injury  Starting tomorrow take 2 Aleve every morning and every evening for the next 5 days then may use as needed, may use Tylenol 500 to 1000 mg every 6 hours for additional support  May use muscle relaxer at bedtime, be mindful this may make you drowsy  You may use heating pad in 15 minute intervals as needed for additional comfort, within the first 2-3 days you may find comfort in using ice in 10-15 minutes over affected area  Begin stretching affected area daily for 10 minutes as tolerated to further loosen muscles   When lying down place pillow underneath and between knees for support  Can try sleeping without pillow on firm mattress   Practice good posture: head back, shoulders back, chest forward, pelvis back and weight distributed evenly on both legs  If pain persist after recommended treatment or reoccurs if may be beneficial to follow up with orthopedic specialist for evaluation, this doctor specializes in the bones and can manage your symptoms long-term with options such as but not limited to imaging, medications or physical therapy

## 2021-12-14 NOTE — ED Triage Notes (Signed)
Pt reports lower back pains started yesterday. Reports lifting and bending at work. Today when tried going to work pains are worse. Denies falls or injury.

## 2021-12-29 ENCOUNTER — Other Ambulatory Visit: Payer: Self-pay | Admitting: Physician Assistant

## 2021-12-29 DIAGNOSIS — R7303 Prediabetes: Secondary | ICD-10-CM

## 2022-01-07 ENCOUNTER — Other Ambulatory Visit (HOSPITAL_COMMUNITY): Payer: Self-pay

## 2022-01-07 ENCOUNTER — Ambulatory Visit (HOSPITAL_COMMUNITY)
Admission: EM | Admit: 2022-01-07 | Discharge: 2022-01-07 | Disposition: A | Discharge status: Home / Self Care | Payer: 59 | Attending: Physician Assistant | Admitting: Physician Assistant

## 2022-01-07 ENCOUNTER — Other Ambulatory Visit: Payer: Self-pay

## 2022-01-07 ENCOUNTER — Encounter (HOSPITAL_COMMUNITY): Payer: Self-pay | Admitting: Emergency Medicine

## 2022-01-07 DIAGNOSIS — K529 Noninfective gastroenteritis and colitis, unspecified: Secondary | ICD-10-CM | POA: Diagnosis not present

## 2022-01-07 DIAGNOSIS — G9331 Postviral fatigue syndrome: Secondary | ICD-10-CM

## 2022-01-07 DIAGNOSIS — R6883 Chills (without fever): Secondary | ICD-10-CM

## 2022-01-07 DIAGNOSIS — R112 Nausea with vomiting, unspecified: Secondary | ICD-10-CM

## 2022-01-07 MED ORDER — ONDANSETRON HCL 4 MG PO TABS
4.0000 mg | ORAL_TABLET | Freq: Three times a day (TID) | ORAL | 0 refills | Status: DC | PRN
  Filled 2022-01-07: qty 20, 7d supply, fill #0

## 2022-01-07 MED ORDER — ONDANSETRON 4 MG PO TBDP
4.0000 mg | ORAL_TABLET | Freq: Once | ORAL | Status: AC
  Administered 2022-01-07: 4 mg via ORAL

## 2022-01-07 MED ORDER — ONDANSETRON 4 MG PO TBDP
ORAL_TABLET | ORAL | Status: AC
  Filled 2022-01-07: qty 1

## 2022-01-07 NOTE — ED Triage Notes (Signed)
Vomiting and diarrhea started this morning around 3 am.  Vomited x 1, diarrhea x 3.  Reports having chills this morning

## 2022-01-07 NOTE — Discharge Instructions (Addendum)
Advised to take the Zofran 4 mg 1 every 6 hours to help control the nausea and vomiting. Advised to increase fluid intake with products such as Sprite, ginger ale, 7-Up, 10K, Gatorade, Pedia-Pop's. Advise a bland diet for the next 48 hours until symptoms resolve. Advised to follow-up PCP or return to urgent care if symptoms fail to improve.

## 2022-01-07 NOTE — ED Provider Notes (Signed)
[place]    CSN: [id number] Arrival date & time: 01/07/22  3428      History   Chief Complaint Chief Complaint  Patient presents with   Diarrhea    HPI Shelby Wyatt is a 56 y.o. female.   56 year old female presents with nausea, vomiting, and diarrhea.  Patient indicates that she ate pizza and spaghetti with meatballs last night and then around 3:00 this morning she woke up having stomach pain, nausea, with repeated vomiting, and diarrhea with loose watery stools.  Patient indicates that this occurred intermittently throughout the night and into this morning.  Patient indicates she has also had chills but without fever.  Patient indicates she has not had any blood in the urine or in the bowels.  She has tolerating fluids well but is having mild fatigue from her symptoms.  Patient has been advised to guard against dehydration which sometimes occurs with nausea vomiting diarrhea.  Patient indicates that she has not been around any family or friends that have been sick with similar type symptoms, and she has not eaten any foods other than what she had last night that did not taste right.   Diarrhea Associated symptoms: vomiting     Past Medical History:  Diagnosis Date   Allergy    Hypertension    No pertinent past medical history    Pre-diabetes    Tubal pregnancy     Patient Active Problem List   Diagnosis Date Noted   Numbness of fingers of both hands 10/12/2021   Colon cancer ([place]) 05/27/2021   Glaucoma suspect 05/27/2021   Serrated polyp of colon 04/02/2021   Hx of adenomatous colonic polyps 04/02/2021   Cecal polyp 04/02/2021   Abnormal colonoscopy 04/02/2021   Cough due to ACE inhibitor 03/05/2021   Obesity (BMI 30-39.9) 03/05/2021   Essential hypertension 12/25/2020   Prediabetes 12/25/2020   Perimenopausal 12/25/2020   Tobacco dependence 12/01/2020   Incontinence of feces 12/01/2020   Abdominal pain 12/01/2020    Past Surgical History:   Procedure Laterality Date   COLONOSCOPY     COLONOSCOPY WITH PROPOFOL N/A 04/07/2021   Procedure: COLONOSCOPY WITH PROPOFOL;  Surgeon: Irving Copas., MD;  Location: [facility];  Service: Gastroenterology;  Laterality: N/A;   ENDOSCOPIC MUCOSAL RESECTION N/A 04/07/2021   Procedure: ENDOSCOPIC MUCOSAL RESECTION;  Surgeon: Rush Landmark Telford Nab., MD;  Location: [facility];  Service: Gastroenterology;  Laterality: N/A;   HEMOSTASIS CLIP PLACEMENT  04/07/2021   Procedure: HEMOSTASIS CLIP PLACEMENT;  Surgeon: Irving Copas., MD;  Location: [facility];  Service: Gastroenterology;;   LAPAROSCOPIC RIGHT HEMI COLECTOMY Right 05/27/2021   Procedure: LAPAROSCOPIC RIGHT HEMI COLECTOMY;  Surgeon: Ileana Roup, MD;  Location: [facility];  Service: General;  Laterality: Right;   laporoscopy     SUBMUCOSAL LIFTING INJECTION  04/07/2021   Procedure: SUBMUCOSAL LIFTING INJECTION;  Surgeon: Irving Copas., MD;  Location: [facility];  Service: Gastroenterology;;   TUBAL LIGATION     WISDOM TOOTH EXTRACTION      OB History     Gravida  2   Para      Term      Preterm      AB  2   Living         SAB      IAB      Ectopic  2   Multiple      Live Births  Home Medications    Prior to Admission medications   Medication Sig Start Date End Date Taking? Authorizing Provider  ondansetron (ZOFRAN) 4 MG tablet Take 1 tablet (4 mg total) by mouth every 8 (eight) hours as needed for nausea or vomiting. 01/07/22  Yes Nyoka Lint, PA-C  amLODipine (NORVASC) 10 MG tablet Take 1 tablet (10 mg total) by mouth daily. 10/12/21   Ladell Pier, MD  bisacodyl 5 MG EC tablet Take 4 tablets (20 mg total) by mouth once daily as needed for constipation for up to 1 dose Patient not taking: Reported on 10/12/2021 04/27/21     cyclobenzaprine (FLEXERIL) 10 MG tablet Take 1 tablet (10 mg total) by mouth at bedtime. 12/14/21   Hans Eden, NP   hydrocortisone 2.5 % cream Apply 1 application topically 2 (two) times daily for 7 days as directed 07/01/21     metFORMIN (GLUCOPHAGE) 500 MG tablet Take 1 tablet (500 mg total) by mouth 2 (two) times daily with a meal. 05/06/21   Ladell Pier, MD  Multiple Vitamins-Minerals (MULTIVITAMIN WITH MINERALS) tablet Take 1 tablet by mouth daily. Centrum 50+    [provider]  naproxen sodium (ALEVE) 220 MG tablet Take 2 tablets (440 mg total) by mouth daily as needed (cramps). 04/21/21   Mansouraty, Telford Nab., MD  nicotine (NICODERM CQ - DOSED IN MG/24 HOURS) 14 mg/24hr patch Place 1 patch (14 mg total) onto the skin daily. 04/02/21   Ladell Pier, MD  polyethylene glycol powder (GLYCOLAX/MIRALAX) 17 GM/SCOOP powder Take 238 g by mouth once for 1 dose; take according to your procedure prep instructions. Patient not taking: Reported on 10/12/2021 04/27/21     valsartan (DIOVAN) 40 MG tablet Take 1 tablet (40 mg total) by mouth daily. Patient taking differently: Take 40 mg by mouth at bedtime. 03/05/21   Ladell Pier, MD    Family History Family History  Problem Relation Age of Onset   Hypertension Mother    Diabetes Mother    Breast cancer Mother    Hypertension Maternal Grandmother    Hypertension Paternal Grandmother    Hypertension Paternal Grandfather    Colon cancer Neg Hx    Esophageal cancer Neg Hx    Rectal cancer Neg Hx    Stomach cancer Neg Hx    Inflammatory bowel disease Neg Hx    Liver disease Neg Hx    Pancreatic cancer Neg Hx     Social History Social History   Tobacco Use   Smoking status: Every Day    Packs/day: 0.25    Types: Cigarettes    Last attempt to quit: 05/02/2021    Years since quitting: 0.6   Smokeless tobacco: Never  Vaping Use   Vaping Use: Never used  Substance Use Topics   Alcohol use: Yes    Comment: occasionally   Drug use: Yes    Types: Marijuana    Comment: "once in a blue moon"     Allergies   Lisinopril and  Terbinafine and related   Review of Systems Review of Systems  Gastrointestinal:  Positive for diarrhea, nausea and vomiting.     Physical Exam Triage Vital Signs ED Triage Vitals  Enc Vitals Group     BP 01/07/22 0844 137/66     Pulse Rate 01/07/22 0844 80     Resp 01/07/22 0844 18     Temp 01/07/22 0844 97.7 F (36.5 C)     Temp Source 01/07/22 0844 Oral  SpO2 01/07/22 0844 97 %     Weight --      Height --      Head Circumference --      Peak Flow --      Pain Score 01/07/22 0841 4     Pain Loc --      Pain Edu? --      Excl. in [place]? --    No data found.  Updated Vital Signs BP 137/66 (BP Location: Left Arm)   Pulse 80   Temp 97.7 F (36.5 C) (Oral)   Resp 18   SpO2 97%   Visual Acuity Right Eye Distance:   Left Eye Distance:   Bilateral Distance:    Right Eye Near:   Left Eye Near:    Bilateral Near:     Physical Exam Constitutional:      Appearance: Normal appearance.  HENT:     Mouth/Throat:     Mouth: Mucous membranes are moist.     Pharynx: Oropharynx is clear. Uvula midline. No pharyngeal swelling.  Cardiovascular:     Rate and Rhythm: Normal rate and regular rhythm.     Heart sounds: Normal heart sounds.  Pulmonary:     Effort: Pulmonary effort is normal.     Breath sounds: Normal breath sounds and air entry. No wheezing, rhonchi or rales.  Abdominal:     General: Abdomen is flat. Bowel sounds are normal.     Palpations: Abdomen is soft.     Tenderness: There is no abdominal tenderness. There is no guarding or rebound.  Lymphadenopathy:     Cervical: No cervical adenopathy.  Neurological:     Mental Status: She is alert.      UC Treatments / Results  Labs (all labs ordered are listed, but only abnormal results are displayed) Labs Reviewed - No data to display  EKG   Radiology No results found.  Procedures Procedures (including critical care time)  Medications Ordered in UC Medications  ondansetron (ZOFRAN-ODT)  disintegrating tablet 4 mg (4 mg Oral Given 01/07/22 0936)    Initial Impression / Assessment and Plan / UC Course  I have reviewed the triage vital signs and the nursing notes.  Pertinent labs & imaging results that were available during my care of the patient were reviewed by me and considered in my medical decision making (see chart for details).    Plan: 1.  Advised to take Zofran 4 mg 1 every 6 hours to help control the nausea and vomiting and slow down the diarrhea. 2.  Advised increase fluid intake to help protect against dehydration. 3.  Advised bland diet such as toast, grits, crackers, chicken broth over the next 48 hours. 4.  Advised follow-up PCP or return to urgent care if symptoms fail to improve. Final Clinical Impressions(s) / UC Diagnoses   Final diagnoses:  Nausea vomiting and diarrhea  Chills  Postviral fatigue syndrome  Noninfectious gastroenteritis, unspecified type     Discharge Instructions      Advised to take the Zofran 4 mg 1 every 6 hours to help control the nausea and vomiting. Advised to increase fluid intake with products such as Sprite, ginger ale, 7-Up, 10K, Gatorade, Pedia-Pop's. Advise a bland diet for the next 48 hours until symptoms resolve. Advised to follow-up PCP or return to urgent care if symptoms fail to improve.    ED Prescriptions     Medication Sig Dispense Auth. Provider   ondansetron (ZOFRAN) 4 MG tablet Take 1 tablet (  4 mg total) by mouth every 8 (eight) hours as needed for nausea or vomiting. 20 tablet Nyoka Lint, PA-C      PDMP not reviewed this encounter.   Nyoka Lint, PA-C 01/07/22 [phone]

## 2022-01-08 ENCOUNTER — Other Ambulatory Visit (HOSPITAL_COMMUNITY): Payer: Self-pay

## 2022-01-12 ENCOUNTER — Ambulatory Visit: Payer: 59 | Attending: Internal Medicine | Admitting: Internal Medicine

## 2022-01-12 ENCOUNTER — Encounter: Payer: Self-pay | Admitting: Internal Medicine

## 2022-01-12 ENCOUNTER — Other Ambulatory Visit (HOSPITAL_COMMUNITY): Payer: Self-pay

## 2022-01-12 VITALS — BP 130/74 | HR 79 | Temp 98.5°F | Resp 16 | Ht 62.0 in | Wt 182.0 lb

## 2022-01-12 DIAGNOSIS — Z6833 Body mass index (BMI) 33.0-33.9, adult: Secondary | ICD-10-CM | POA: Diagnosis not present

## 2022-01-12 DIAGNOSIS — Z0000 Encounter for general adult medical examination without abnormal findings: Secondary | ICD-10-CM

## 2022-01-12 DIAGNOSIS — B351 Tinea unguium: Secondary | ICD-10-CM | POA: Diagnosis not present

## 2022-01-12 DIAGNOSIS — Z23 Encounter for immunization: Secondary | ICD-10-CM

## 2022-01-12 DIAGNOSIS — F17210 Nicotine dependence, cigarettes, uncomplicated: Secondary | ICD-10-CM

## 2022-01-12 DIAGNOSIS — Z85038 Personal history of other malignant neoplasm of large intestine: Secondary | ICD-10-CM

## 2022-01-12 DIAGNOSIS — E669 Obesity, unspecified: Secondary | ICD-10-CM

## 2022-01-12 DIAGNOSIS — Z0001 Encounter for general adult medical examination with abnormal findings: Secondary | ICD-10-CM | POA: Diagnosis not present

## 2022-01-12 DIAGNOSIS — I10 Essential (primary) hypertension: Secondary | ICD-10-CM

## 2022-01-12 DIAGNOSIS — F17200 Nicotine dependence, unspecified, uncomplicated: Secondary | ICD-10-CM

## 2022-01-12 MED ORDER — NICOTINE POLACRILEX 2 MG MT GUM
2.0000 mg | CHEWING_GUM | OROMUCOSAL | 2 refills | Status: DC | PRN
  Filled 2022-01-12: qty 110, 10d supply, fill #0
  Filled 2022-06-16: qty 110, 10d supply, fill #1
  Filled 2022-12-09: qty 110, 10d supply, fill #2

## 2022-01-12 MED ORDER — CICLOPIROX 8 % EX SOLN
Freq: Every day | CUTANEOUS | 1 refills | Status: DC
  Filled 2022-01-12: qty 6.6, 30d supply, fill #0
  Filled 2022-02-07: qty 6.6, 30d supply, fill #1

## 2022-01-12 NOTE — Progress Notes (Signed)
[name], female    DOB: Mar 11, 1966  MRN: [id number]  CC: Annual Exam   Subjective: Shelby Wyatt is a 56 y.o. female who presents for annual exam Her concerns today include:  Tob dep, fecal incont, HTN, preDM, obesity, colon CA (s/p RT hemicolectomy 05/2021)  HM:  due for Tdapt, Shingles  Obesity/PreDM: wants to get wgh down.  Wgh down 5 lbs since 11/2021.   -"doing better with watching what I eat." Drinks mainly water, zero calorie lemonade, Powerade (simple to [place]).  Has cut back on sodas to no more that once a wk.  Eats fruits but not on a daily bases.  Does better with eating veggies. -started walking for 10-15 mins a day and some in-house exercise  Wanting to know if it is okay for her to drink a supplement call Drink 2 Shrink which she had used in the past and had lost 7 pounds on it.  She showed me the ingredients online.  It includes for [place], papaya, chamomile leaves and something called thistle. Marland Kitchen   HTN:   taking' and tolerating amlodipine 10 mg daily and Diovan 40 mg daily Limits salt No CP/SOB.  Tob Dep:  still at 1/2 pk a day.  Ready to quit.  "I'm tired of them now but still continue to do it." Plans to start smoking cessation classes.  Uses the patches in the past but still got cravings.  Afraid of Chantix due to possible S.E  Hx of colon CA: Had surgery this past December by Dr. Dema Severin.  She is not sure when she is due to follow-up.  Moving her bowels okay.  No blood in the stools.  C/o having fungal toe nail infection. Did not tolerate Lamisil pill and caused hives.  Friend told her about an oral supplement drink that she can buy online that will cure the toenail fungus.  She showed it to me on her phone.  Patient Active Problem List   Diagnosis Date Noted   Numbness of fingers of both hands 10/12/2021   Colon cancer ([place]) 05/27/2021   Glaucoma suspect 05/27/2021   Serrated polyp of colon 04/02/2021   Hx of adenomatous colonic  polyps 04/02/2021   Cecal polyp 04/02/2021   Abnormal colonoscopy 04/02/2021   Cough due to ACE inhibitor 03/05/2021   Obesity (BMI 30-39.9) 03/05/2021   Essential hypertension 12/25/2020   Prediabetes 12/25/2020   Perimenopausal 12/25/2020   Tobacco dependence 12/01/2020   Incontinence of feces 12/01/2020   Abdominal pain 12/01/2020     Current Outpatient Medications on File Prior to Visit  Medication Sig Dispense Refill   amLODipine (NORVASC) 10 MG tablet Take 1 tablet (10 mg total) by mouth daily. 30 tablet 6   metFORMIN (GLUCOPHAGE) 500 MG tablet Take 1 tablet (500 mg total) by mouth 2 (two) times daily with a meal. 180 tablet 0   Multiple Vitamins-Minerals (MULTIVITAMIN WITH MINERALS) tablet Take 1 tablet by mouth daily. Centrum 50+     naproxen sodium (ALEVE) 220 MG tablet Take 2 tablets (440 mg total) by mouth daily as needed (cramps). 30 tablet 0   nicotine (NICODERM CQ - DOSED IN MG/24 HOURS) 14 mg/24hr patch Place 1 patch (14 mg total) onto the skin daily. 28 patch 2   ondansetron (ZOFRAN) 4 MG tablet Take 1 tablet (4 mg total) by mouth every 8 (eight) hours as needed for nausea or vomiting. 20 tablet 0   valsartan (DIOVAN) 40 MG tablet Take 1 tablet (  40 mg total) by mouth daily. (Patient taking differently: Take 40 mg by mouth at bedtime.) 90 tablet 3   hydrocortisone 2.5 % cream Apply 1 application topically 2 (two) times daily for 7 days as directed 30 g 0   No current facility-administered medications on file prior to visit.    Allergies  Allergen Reactions   Lisinopril Cough   Terbinafine And Related Hives    Social History   Socioeconomic History   Marital status: Married    Spouse name: Not on file   Number of children: 0   Years of education: Not on file   Highest education level: 12th grade  Occupational History   Occupation: Food services - [employer]  Tobacco Use   Smoking status: Every Day    Packs/day: 0.25    Types: Cigarettes    Last attempt to quit:  05/02/2021    Years since quitting: 0.6   Smokeless tobacco: Never  Vaping Use   Vaping Use: Never used  Substance and Sexual Activity   Alcohol use: Yes    Comment: occasionally   Drug use: Yes    Types: Marijuana    Comment: "once in a blue moon"   Sexual activity: Yes    Birth control/protection: None  Other Topics Concern   Not on file  Social History Narrative   Not on file   Social Determinants of Health   Financial Resource Strain: Not on file  Food Insecurity: Not on file  Transportation Needs: Not on file  Physical Activity: Not on file  Stress: Not on file  Social Connections: Not on file  Intimate Partner Violence: Not on file    Family History  Problem Relation Age of Onset   Hypertension Mother    Diabetes Mother    Breast cancer Mother    Hypertension Maternal Grandmother    Hypertension Paternal Grandmother    Hypertension Paternal Grandfather    Colon cancer Neg Hx    Esophageal cancer Neg Hx    Rectal cancer Neg Hx    Stomach cancer Neg Hx    Inflammatory bowel disease Neg Hx    Liver disease Neg Hx    Pancreatic cancer Neg Hx     Past Surgical History:  Procedure Laterality Date   COLONOSCOPY     COLONOSCOPY WITH PROPOFOL N/A 04/07/2021   Procedure: COLONOSCOPY WITH PROPOFOL;  Surgeon: Mansouraty, Telford Nab., MD;  Location: [facility];  Service: Gastroenterology;  Laterality: N/A;   ENDOSCOPIC MUCOSAL RESECTION N/A 04/07/2021   Procedure: ENDOSCOPIC MUCOSAL RESECTION;  Surgeon: Rush Landmark Telford Nab., MD;  Location: [facility];  Service: Gastroenterology;  Laterality: N/A;   HEMOSTASIS CLIP PLACEMENT  04/07/2021   Procedure: HEMOSTASIS CLIP PLACEMENT;  Surgeon: Irving Copas., MD;  Location: [facility];  Service: Gastroenterology;;   LAPAROSCOPIC RIGHT HEMI COLECTOMY Right 05/27/2021   Procedure: LAPAROSCOPIC RIGHT HEMI COLECTOMY;  Surgeon: Ileana Roup, MD;  Location: [facility];  Service: General;  Laterality: Right;    laporoscopy     SUBMUCOSAL LIFTING INJECTION  04/07/2021   Procedure: SUBMUCOSAL LIFTING INJECTION;  Surgeon: Irving Copas., MD;  Location: [facility];  Service: Gastroenterology;;   TUBAL LIGATION     WISDOM TOOTH EXTRACTION      ROS: Review of Systems Negative except as stated above  PHYSICAL EXAM: BP 130/74 (BP Location: Left Arm, Patient Position: Sitting, Cuff Size: Normal)   Pulse 79   Temp 98.5 F (36.9 C) (Oral)   Resp 16   Ht  $'5\' 2"'y$  (1.575 m)   Wt 182 lb (82.6 kg)   SpO2 98%   BMI 33.29 kg/m   Wt Readings from Last 3 Encounters:  01/12/22 182 lb (82.6 kg)  12/07/21 187 lb 1.6 oz (84.9 kg)  10/12/21 185 lb (83.9 kg)    Physical Exam  General appearance - alert, well appearing, and in no distress Mental status - normal mood, behavior, speech, dress, motor activity, and thought processes Eyes - pupils equal and reactive, extraocular eye movements intact Ears - bilateral TM's and external ear canals normal Nose - normal and patent, no erythema, discharge or polyps Mouth - mucous membranes moist, pharynx normal without lesions Neck - supple, no significant adenopathy Chest - clear to auscultation, no wheezes, rales or rhonchi, symmetric air entry Heart - normal rate, regular rhythm, normal S1, S2, no murmurs, rubs, clicks or gallops Abdomen - soft, nontender, nondistended, no masses or organomegaly Neurological - cranial nerves II through XII intact, motor and sensory grossly normal bilaterally Extremities - peripheral pulses normal, no pedal edema, no clubbing or cyanosis Skin - normal coloration and turgor, no rashes, no suspicious skin lesions noted Toenails: Toenails are partially painted.  However I can see discoloration and thickening of the nails consistent with onychomycosis.     Latest Ref Rng & Units 05/30/2021    5:11 AM 05/29/2021    4:27 AM 05/28/2021    4:21 AM  CMP  Glucose 70 - 99 mg/dL 127  106  146   BUN 6 - 20 mg/dL '11  10  8    '$ Creatinine 0.44 - 1.00 mg/dL 0.64  0.57  0.63   Sodium 135 - 145 mmol/L 134  137  135   Potassium 3.5 - 5.1 mmol/L 4.1  3.8  4.3   Chloride 98 - 111 mmol/L 99  103  106   CO2 22 - 32 mmol/L '26  25  23   '$ Calcium 8.9 - 10.3 mg/dL 9.0  9.0  8.5    Lipid Panel     Component Value Date/Time   CHOL 155 12/10/2020 0958   TRIG 60 12/10/2020 0958   HDL 52 12/10/2020 0958   CHOLHDL 3.0 12/10/2020 0958   LDLCALC 91 12/10/2020 0958    CBC    Component Value Date/Time   WBC 10.0 05/30/2021 0511   RBC 4.83 05/30/2021 0511   HGB 13.6 05/30/2021 0511   HGB 12.7 12/10/2020 0958   HCT 41.0 05/30/2021 0511   HCT 38.2 12/10/2020 0958   PLT 275 05/30/2021 0511   PLT 258 12/10/2020 0958   MCV 84.9 05/30/2021 0511   MCV 84 12/10/2020 0958   MCH 28.2 05/30/2021 0511   MCHC 33.2 05/30/2021 0511   RDW 15.2 05/30/2021 0511   RDW 14.7 12/10/2020 0958    ASSESSMENT AND PLAN: 1. Annual physical exam   2. Essential hypertension At goal.  Continue Norvasc and Diovan.  3. Tobacco dependence Pt is current smoker. Patient advised to quit smoking. Discussed health risks associated with smoking including lung and other types of cancers, chronic lung diseases and CV risks.. Pt ready to give trail of quitting.   Discussed methods to help quit including quitting cold [place], use of NRT, Chantix and Bupropion.  Pt wanting to try: Nicotine gum. _3_ Minutes spent on counseling. F/U: Reassess progress on subsequent visit.  - nicotine polacrilex (NICORETTE) 2 MG gum; Take 1 each (2 mg total) by mouth as needed for smoking cessation.  Dispense: 110 tablet; Refill: 2  4. Onychomycosis Patient reports allergic reaction to Lamisil.  Advised against using supplemental that she showed me online.  Recommend trying Penlac.  I told her that it does not work as well as oral Lamisil but it is worth a shot.  She would have to use it for several months.  I went over with her how to use it. - ciclopirox (PENLAC) 8 %  solution; Apply topically at bedtime. Apply over nail and surrounding skin. Apply daily over previous coat. After seven (7) days, may remove with alcohol and continue cycle.  Dispense: 6.6 mL; Refill: 1  5. Obesity (BMI 30.0-34.9) Advised patient that it is okay for her to do the supplement drink that she showed me if indeed it contains the fruits that were mentioned on the label. Patient advised to eliminate sugary drinks from the diet, cut back on portion sizes especially of white carbohydrates, eat more white lean meat like chicken [place] and seafood instead of beef or pork and incorporate fresh fruits and vegetables into the diet daily. Encouraged her to get in some form of moderate intensity exercise at least 3 to 5 days a week for 30 minutes  6. History of colon cancer Advised to call Dr. Orest Dikes office to inquire whether she needs to follow-up with him in 1 year which will be this December and if so to have them schedule her.  7. Need for Tdap vaccination Given today  8. Need for shingles vaccine Given first shot today.     Patient was given the opportunity to ask questions.  Patient verbalized understanding of the plan and was able to repeat key elements of the plan.   This documentation was completed using Radio producer.  Any transcriptional errors are unintentional.  No orders of the defined types were placed in this encounter.    Requested Prescriptions    No prescriptions requested or ordered in this encounter    No follow-ups on file.  Karle Plumber, MD, FACP

## 2022-01-12 NOTE — Patient Instructions (Signed)
I have sent the prescription to your pharmacy for the nicotine gum to use as needed. I have sent the prescription to your pharmacy for the antifungal nail pen for you to use as was discussed.  Healthy Eating Following a healthy eating pattern may help you to achieve and maintain a healthy body weight, reduce the risk of chronic disease, and live a long and productive life. It is important to follow a healthy eating pattern at an appropriate calorie level for your body. Your nutritional needs should be met primarily through food by choosing a variety of nutrient-rich foods. What are tips for following this plan? Reading food labels Read labels and choose the following: Reduced or low sodium. Juices with 100% fruit juice. Foods with low saturated fats and high polyunsaturated and monounsaturated fats. Foods with whole grains, such as whole wheat, cracked wheat, brown rice, and wild rice. Whole grains that are fortified with folic acid. This is recommended for women who are pregnant or who want to become pregnant. Read labels and avoid the following: Foods with a lot of added sugars. These include foods that contain brown sugar, corn sweetener, corn syrup, dextrose, fructose, glucose, high-fructose corn syrup, honey, invert sugar, lactose, malt syrup, maltose, molasses, raw sugar, sucrose, trehalose, or turbinado sugar. Do not eat more than the following amounts of added sugar per day: 6 teaspoons (25 g) for women. 9 teaspoons (38 g) for men. Foods that contain processed or refined starches and grains. Refined grain products, such as white flour, degermed cornmeal, white bread, and white rice. Shopping Choose nutrient-rich snacks, such as vegetables, whole fruits, and nuts. Avoid high-calorie and high-sugar snacks, such as potato chips, fruit snacks, and candy. Use oil-based dressings and spreads on foods instead of solid fats such as butter, stick margarine, or cream cheese. Limit pre-made  sauces, mixes, and "instant" products such as flavored rice, instant noodles, and ready-made pasta. Try more plant-protein sources, such as tofu, tempeh, black beans, edamame, lentils, nuts, and seeds. Explore eating plans such as the Mediterranean diet or vegetarian diet. Cooking Use oil to saut or stir-fry foods instead of solid fats such as butter, stick margarine, or lard. Try baking, boiling, grilling, or broiling instead of frying. Remove the fatty part of meats before cooking. Steam vegetables in water or broth. Meal planning  At meals, imagine dividing your plate into fourths: One-half of your plate is fruits and vegetables. One-fourth of your plate is whole grains. One-fourth of your plate is protein, especially lean meats, poultry, eggs, tofu, beans, or nuts. Include low-fat dairy as part of your daily diet. Lifestyle Choose healthy options in all settings, including home, work, school, restaurants, or stores. Prepare your food safely: Wash your hands after handling raw meats. Keep food preparation surfaces clean by regularly washing with hot, soapy water. Keep raw meats separate from ready-to-eat foods, such as fruits and vegetables. Cook seafood, meat, poultry, and eggs to the recommended internal temperature. Store foods at safe temperatures. In general: Keep cold foods at 69F (4.4C) or below. Keep hot foods at 169F (60C) or above. Keep your freezer at [facility] (-17.8C) or below. Foods are no longer safe to eat when they have been between the temperatures of 40-169F (4.4-60C) for more than 2 hours. What foods should I eat? Fruits Aim to eat 2 cup-equivalents of fresh, canned (in natural juice), or frozen fruits each day. Examples of 1 cup-equivalent of fruit include 1 small apple, 8 large strawberries, 1 cup canned fruit,  cup dried  fruit, or 1 cup 100% juice. Vegetables Aim to eat 2-3 cup-equivalents of fresh and frozen vegetables each day, including different  varieties and colors. Examples of 1 cup-equivalent of vegetables include 2 medium carrots, 2 cups raw, leafy greens, 1 cup chopped vegetable (raw or cooked), or 1 medium baked potato. Grains Aim to eat 6 ounce-equivalents of whole grains each day. Examples of 1 ounce-equivalent of grains include 1 slice of bread, 1 cup ready-to-eat cereal, 3 cups popcorn, or  cup cooked rice, pasta, or cereal. Meats and other proteins Aim to eat 5-6 ounce-equivalents of protein each day. Examples of 1 ounce-equivalent of protein include 1 egg, 1/2 cup nuts or seeds, or 1 tablespoon (16 g) peanut butter. A cut of meat or fish that is the size of a deck of cards is about 3-4 ounce-equivalents. Of the protein you eat each week, try to have at least 8 ounces come from seafood. This includes salmon, trout, herring, and anchovies. Dairy Aim to eat 3 cup-equivalents of fat-free or low-fat dairy each day. Examples of 1 cup-equivalent of dairy include 1 cup (240 mL) milk, 8 ounces (250 g) yogurt, 1 ounces (44 g) natural cheese, or 1 cup (240 mL) fortified soy milk. Fats and oils Aim for about 5 teaspoons (21 g) per day. Choose monounsaturated fats, such as canola and olive oils, avocados, peanut butter, and most nuts, or polyunsaturated fats, such as sunflower, corn, and soybean oils, walnuts, pine nuts, sesame seeds, sunflower seeds, and flaxseed. Beverages Aim for six 8-oz glasses of water per day. Limit coffee to three to five 8-oz cups per day. Limit caffeinated beverages that have added calories, such as soda and energy drinks. Limit alcohol intake to no more than 1 drink a day for nonpregnant women and 2 drinks a day for men. One drink equals 12 oz of beer (355 mL), 5 oz of wine (148 mL), or 1 oz of hard liquor (44 mL). Seasoning and other foods Avoid adding excess amounts of salt to your foods. Try flavoring foods with herbs and spices instead of salt. Avoid adding sugar to foods. Try using oil-based dressings,  sauces, and spreads instead of solid fats. This information is based on general [place] nutrition guidelines. For more information, visit [employer]. Exact amounts may vary based on your nutrition needs. Summary A healthy eating plan may help you to maintain a healthy weight, reduce the risk of chronic diseases, and stay active throughout your life. Plan your meals. Make sure you eat the right portions of a variety of nutrient-rich foods. Try baking, boiling, grilling, or broiling instead of frying. Choose healthy options in all settings, including home, work, school, restaurants, or stores. This information is not intended to replace advice given to you by your health care provider. Make sure you discuss any questions you have with your health care provider. Document Revised: 01/27/2021 Document Reviewed: 01/27/2021 Elsevier Patient Education  [place].

## 2022-01-12 NOTE — Progress Notes (Signed)
Excessive sweating  Help with weight loss

## 2022-01-13 ENCOUNTER — Other Ambulatory Visit: Payer: Self-pay | Admitting: Internal Medicine

## 2022-01-13 DIAGNOSIS — R7303 Prediabetes: Secondary | ICD-10-CM

## 2022-01-13 NOTE — Telephone Encounter (Signed)
Medication Refill - Medication: metFORMIN (GLUCOPHAGE) 500 MG tablet  Has the patient contacted their pharmacy? Yes.   Pt told no refills and to contact provider (Agent: If no, request that the patient contact the pharmacy for the refill. If patient does not wish to contact the pharmacy document the reason why and proceed with request.) (Agent: If yes, when and what did the pharmacy advise?)  Preferred Pharmacy (with phone number or street name):  [facility] Phone:  [phone]  Fax:  [phone]     Has the patient been seen for an appointment in the last year OR does the patient have an upcoming appointment? Yes.    Agent: Please be advised that RX refills may take up to 3 business days. We ask that you follow-up with your pharmacy.

## 2022-01-14 ENCOUNTER — Other Ambulatory Visit (HOSPITAL_COMMUNITY): Payer: Self-pay

## 2022-01-14 MED ORDER — METFORMIN HCL 500 MG PO TABS
500.0000 mg | ORAL_TABLET | Freq: Two times a day (BID) | ORAL | 1 refills | Status: DC
  Filled 2022-01-14: qty 180, 90d supply, fill #0
  Filled 2022-06-16: qty 180, 90d supply, fill #1

## 2022-01-14 NOTE — Telephone Encounter (Signed)
Requested Prescriptions  Pending Prescriptions Disp Refills  . metFORMIN (GLUCOPHAGE) 500 MG tablet 180 tablet 0    Sig: Take 1 tablet (500 mg total) by mouth 2 (two) times daily with a meal.     Endocrinology:  Diabetes - Biguanides Failed - 01/13/2022  4:32 PM      Failed - B12 Level in normal range and within 720 days    No results found for: "VITAMINB12"       Failed - CBC within normal limits and completed in the last 12 months    WBC  Date Value Ref Range Status  05/30/2021 10.0 4.0 - 10.5 K/uL Final   RBC  Date Value Ref Range Status  05/30/2021 4.83 3.87 - 5.11 MIL/uL Final   Hemoglobin  Date Value Ref Range Status  05/30/2021 13.6 12.0 - 15.0 g/dL Final  12/10/2020 12.7 11.1 - 15.9 g/dL Final   HCT  Date Value Ref Range Status  05/30/2021 41.0 36.0 - 46.0 % Final   Hematocrit  Date Value Ref Range Status  12/10/2020 38.2 34.0 - 46.6 % Final   MCHC  Date Value Ref Range Status  05/30/2021 33.2 30.0 - 36.0 g/dL Final   [facility]  Date Value Ref Range Status  05/30/2021 28.2 26.0 - 34.0 pg Final   MCV  Date Value Ref Range Status  05/30/2021 84.9 80.0 - 100.0 fL Final  12/10/2020 84 79 - 97 fL Final   No results found for: "PLTCOUNTKUC", "LABPLAT", "POCPLA" RDW  Date Value Ref Range Status  05/30/2021 15.2 11.5 - 15.5 % Final  12/10/2020 14.7 11.7 - 15.4 % Final         Passed - Cr in normal range and within 360 days    Creatinine, Ser  Date Value Ref Range Status  05/30/2021 0.64 0.44 - 1.00 mg/dL Final         Passed - HBA1C is between 0 and 7.9 and within 180 days    Hemoglobin A1C  Date Value Ref Range Status  10/12/2021 6.4 (A) 4.0 - 5.6 % Final   Hgb A1c MFr Bld  Date Value Ref Range Status  05/04/2021 5.8 (H) 4.8 - 5.6 % Final    Comment:    (NOTE) Pre diabetes:          5.7%-6.4%  Diabetes:              >6.4%  Glycemic control for   <7.0% adults with diabetes          Passed - eGFR in normal range and within 360 days    GFR calc Af  [facility]mer  Date Value Ref Range Status  10/20/2018 >60 >60 mL/min Final   GFR, Estimated  Date Value Ref Range Status  05/30/2021 >60 >60 mL/min Final    Comment:    (NOTE) Calculated using the CKD-EPI Creatinine Equation (2021)    GFR  Date Value Ref Range Status  04/01/2021 100.40 >60.00 mL/min Final    Comment:    Calculated using the CKD-EPI Creatinine Equation (2021)   eGFR  Date Value Ref Range Status  12/10/2020 107 >59 mL/min/1.73 Final         Passed - Valid encounter within last 6 months    Recent Outpatient Visits          2 days ago Annual physical exam   [place], Deborah B, MD   3 months ago Numbness of fingers of both hands  [place], MD   5 months ago Essential hypertension   [place], MD   10 months ago Essential hypertension   [place], Deborah B, MD   1 year ago Encounter to establish care   [place], MD      Future Appointments            In 4 months Wynetta Emery Dalbert Batman, MD [place]

## 2022-01-18 ENCOUNTER — Encounter: Payer: 59 | Attending: Internal Medicine | Admitting: Registered"

## 2022-01-18 ENCOUNTER — Encounter: Payer: Self-pay | Admitting: Registered"

## 2022-01-18 VITALS — Ht 62.0 in | Wt 185.7 lb

## 2022-01-18 DIAGNOSIS — R7303 Prediabetes: Secondary | ICD-10-CM | POA: Diagnosis not present

## 2022-01-18 NOTE — Patient Instructions (Signed)
Work on portion sizes, continue pushing the plate away when you are full.  Get some snacks that you can have to keep you from getting too hungry before dinner. Apples and small piece of cheese, blueberry smoothie, green smoothie Consider finding an alternative to the muffins you are having at work.  Continue walking more

## 2022-01-18 NOTE — Progress Notes (Unsigned)
Medical Nutrition Therapy follow-up Appointment Start time:  [phone] Appointment End time:  68  Primary concerns today: Prediabetes  Referral diagnosis: R73.03 - Prediabetes Preferred learning style: No preference indicated Learning readiness: Ready  NUTRITION ASSESSMENT   Anthropometrics  Wt Readings from Last 3 Encounters:  01/18/22 185 lb 11.2 oz (84.2 kg)  01/12/22 182 lb (82.6 kg)  12/07/21 187 lb 1.6 oz (84.9 kg)   Patient reports recent weight loss was likely due to GI / food poisoning. Pt reports has felt better for the last week.  Clinical Medical Hx: HTN, Prediabetes, Tobacco use, Hemicolectomy Medications: Lisinopril, metformin takes 1x day, Valsartan, Nicoderm patch Labs: A1c 6.4% 10/12/2021 Notable Signs/Symptoms: N/A  Lifestyle & Dietary Hx Pt reports she felt good when she was able to get A1c down to 5.8% (05/04/2021). Pt states after surgery and was able to eat again didn't pay too much attention to eating to control blood sugar.   Pt states she has not increased protein with breakfast or snacks, still has muffins or bagels for go to, but states she will try to start including more chicken and eggs.   Pt states she has been eating more salad and broccoli in general, vegetarian baked beans. pt reports it has helped BM. Pt states she is not interested in taking a lot of pills to replace fiber powder.  Pt states she drinks ginger ale with food at work to prevent nausea.  Estimated daily fluid intake: 33-60 oz Supplements: Centrum silver women's One-A-Day Sleep: hot flashes not too bad, but still waking up sweating Stress / self-care:  Current average weekly physical activity: ADLs, walking more without dog that slows her down., dances  24-Hr Dietary Recall Snack:  First Meal: cream of wheat 2x/week; muffin or bagel strawberry cream cheese Snack: banana nut muffin Second Meal: key lime chicken, broccoli, rice, mac and cheese, small fountain ginger ale Snack:  none Third Meal: spaghetti, crackers Snack: none Beverages: water, ginger ale 2x/month   NUTRITION DIAGNOSIS  NB-1.1 Food and nutrition-related knowledge deficit As related to prediabetes.  As evidenced by A1c of 6.0, skipping lunch, and self-reported over consumption at meals.   NUTRITION INTERVENTION  Nutrition education (E-1) on the following topics:  Exercise, types and intensity Reviewed carb / protein balance Protein needed for muscles with aging   Handouts Provided Include  none  Learning Style & Readiness for Change Teaching method utilized: Visual & Auditory  Demonstrated degree of understanding via: Teach Back  Barriers to learning/adherence to lifestyle change: None  Goals Established by Pt Great job on adding more salads to your meals to get more balance  Exercise: get back in your routine of walking 3x week during lunch break, push yourself to get your heart rate up to moderate intensity. Work toward strength exercises 2x week and also some stretching. Water aerobics is a good idea.  Consider talking to your gyn MD again about your hot flashes since they are disrupting your sleep.  Talk to doctor about taking pill form of Metamucil instead of powder since you are not liking drinking it daily.   MONITORING & EVALUATION Dietary intake, weekly physical activity, and meal frequency in 3 months.  Next Steps  Patient is to follow up with RD in 2 months

## 2022-02-08 ENCOUNTER — Other Ambulatory Visit (HOSPITAL_COMMUNITY): Payer: Self-pay

## 2022-03-22 ENCOUNTER — Encounter: Payer: 59 | Attending: Internal Medicine | Admitting: Registered"

## 2022-03-22 ENCOUNTER — Encounter: Payer: Self-pay | Admitting: Registered"

## 2022-03-22 DIAGNOSIS — R7303 Prediabetes: Secondary | ICD-10-CM | POA: Insufficient documentation

## 2022-03-22 NOTE — Progress Notes (Signed)
Medical Nutrition Therapy follow-up  03/22/22 Appointment Start time:  1650 Appointment End time:  1725  Primary concerns today: Prediabetes  Referral diagnosis: R73.03 - Prediabetes Preferred learning style: No preference indicated Learning readiness: Ready  NUTRITION ASSESSMENT   Anthropometrics  Wt Readings from Last 3 Encounters:  03/22/22 183 lb 1.6 oz (83.1 kg)  01/18/22 185 lb 11.2 oz (84.2 kg)  01/12/22 182 lb (82.6 kg)     Clinical Medical Hx: HTN, Prediabetes, Tobacco use, Hemicolectomy Medications: Lisinopril, metformin 1x day sometimes does not take as directed because she doesn't eat breakfast, Valsartan, Nicoderm patch Labs: A1c 6.4% 10/12/2021 Notable Signs/Symptoms: hypoglycemic sxs, shakey. Pt states she had donut and coffee, splenda around 7 am; around 10-10:30 sxs.  Lifestyle & Dietary Hx Pt reports she felt good when she was able to get A1c down to 5.8% (05/04/2021).   Pt continues to eat meals at work and not a fan of what is offered for breakfast. Pt states she still drinks ginger ale with food at work to prevent nausea, but doesn't drink the full cup.  Pt states she has started making smoothies at home. Fit and light, vanilla greek yogurt, pineapple, blueberry, 1/2 banana, 2T oatmeal, chia seeds, cinnamon, ice, a few tablespoons milk 2%. Green smoothie with spinach, whole banana, 1/2 c pineapple, vanilla greek yogurt, chia seeds. Flaxseed  Estimated daily fluid intake: not assessed this visit Supplements: Centrum silver women's One-A-Day Sleep: hot flashes continues to disrupt sleep, uses a fan Stress / self-care:  Current average weekly physical activity: ADLs  24-Hr Dietary Recall Snack:  First Meal: duck donut, coffee  Snack:  Second Meal: chicken, corn bread, mac & cheese, green beans, regular 16 oz ginger ale (felt better) Snack: none Third Meal: pot roast, potatoes, carrots, lima beans and corn, with wheat bread. Peanut butter cookie ~30 min  later Snack: none Beverages: water, ginger ale 2x/month usually around 8 oz, peach mango V8 splash    NUTRITION DIAGNOSIS  NB-1.1 Food and nutrition-related knowledge deficit As related to prediabetes.  As evidenced by A1c of 6.0, skipping lunch, and self-reported over consumption at meals.   NUTRITION INTERVENTION  Nutrition education (E-1) on the following topics:  Exercise types Sleep  Handouts Provided Include  none  Learning Style & Readiness for Change Teaching method utilized: Visual & Auditory  Demonstrated degree of understanding via: Teach Back  Barriers to learning/adherence to lifestyle change: None  Goals Established by Pt Great job on adding more salads to your meals to get more balance.   Still eating a lot of salad now eating broccoli, asparagus, collard greens, cabbage, succatash  Exercise: get back in your routine of walking 3x week during lunch break, push yourself to get your heart rate up to moderate intensity. (Walks a steady pace, moderate to intense when going up hill. Work toward strength exercises 2x week and also some stretching. aerobics is a good idea. (Continue with this goal, forgot about it)  Continue with your plan to start drinking the tea with herbs that can help hot flashes (the one that your doctor reviewed)   MONITORING & EVALUATION Dietary intake, weekly physical activity, and meal frequency in 3 months.  Next Steps  Patient is to follow up with RD prn

## 2022-03-22 NOTE — Patient Instructions (Addendum)
Great job on adding more salads to your meals to get more balance.   Still eating a lot of salad now eating broccoli, asparagus, collard greens, cabbage, succatash  Exercise: get back in your routine of walking 3x week during lunch break, push yourself to get your heart rate up to moderate intensity. (Walks a steady pace, moderate to intense when going up hill. Work toward strength exercises 2x week and also some stretching. aerobics is a good idea. (Continue with this goal, forgot about it)  Continue with your plan to start drinking the tea with herbs that can help hot flashes (the one that your doctor reviewed)

## 2022-04-06 ENCOUNTER — Other Ambulatory Visit: Payer: Self-pay | Admitting: Internal Medicine

## 2022-04-06 DIAGNOSIS — I10 Essential (primary) hypertension: Secondary | ICD-10-CM

## 2022-04-07 ENCOUNTER — Other Ambulatory Visit (HOSPITAL_COMMUNITY): Payer: Self-pay

## 2022-04-07 MED ORDER — VALSARTAN 40 MG PO TABS
40.0000 mg | ORAL_TABLET | Freq: Every day | ORAL | 0 refills | Status: DC
  Filled 2022-04-07: qty 90, 90d supply, fill #0

## 2022-05-14 DIAGNOSIS — H2513 Age-related nuclear cataract, bilateral: Secondary | ICD-10-CM | POA: Diagnosis not present

## 2022-05-14 DIAGNOSIS — H40013 Open angle with borderline findings, low risk, bilateral: Secondary | ICD-10-CM | POA: Diagnosis not present

## 2022-05-14 DIAGNOSIS — H35363 Drusen (degenerative) of macula, bilateral: Secondary | ICD-10-CM | POA: Diagnosis not present

## 2022-05-14 DIAGNOSIS — H31013 Macula scars of posterior pole (postinflammatory) (post-traumatic), bilateral: Secondary | ICD-10-CM | POA: Diagnosis not present

## 2022-05-14 DIAGNOSIS — H53022 Refractive amblyopia, left eye: Secondary | ICD-10-CM | POA: Diagnosis not present

## 2022-05-18 ENCOUNTER — Ambulatory Visit: Payer: 59 | Attending: Internal Medicine | Admitting: Internal Medicine

## 2022-05-18 ENCOUNTER — Other Ambulatory Visit (HOSPITAL_COMMUNITY): Payer: Self-pay

## 2022-05-18 ENCOUNTER — Encounter: Payer: Self-pay | Admitting: Internal Medicine

## 2022-05-18 VITALS — BP 120/60 | HR 83 | Temp 98.2°F | Ht 62.0 in | Wt 181.0 lb

## 2022-05-18 DIAGNOSIS — Z85038 Personal history of other malignant neoplasm of large intestine: Secondary | ICD-10-CM

## 2022-05-18 DIAGNOSIS — E669 Obesity, unspecified: Secondary | ICD-10-CM

## 2022-05-18 DIAGNOSIS — F17200 Nicotine dependence, unspecified, uncomplicated: Secondary | ICD-10-CM | POA: Diagnosis not present

## 2022-05-18 DIAGNOSIS — L84 Corns and callosities: Secondary | ICD-10-CM | POA: Diagnosis not present

## 2022-05-18 DIAGNOSIS — Z23 Encounter for immunization: Secondary | ICD-10-CM

## 2022-05-18 DIAGNOSIS — R7303 Prediabetes: Secondary | ICD-10-CM

## 2022-05-18 DIAGNOSIS — N951 Menopausal and female climacteric states: Secondary | ICD-10-CM | POA: Diagnosis not present

## 2022-05-18 DIAGNOSIS — I10 Essential (primary) hypertension: Secondary | ICD-10-CM | POA: Diagnosis not present

## 2022-05-18 LAB — POCT GLYCOSYLATED HEMOGLOBIN (HGB A1C): HbA1c, POC (prediabetic range): 6.4 % (ref 5.7–6.4)

## 2022-05-18 LAB — GLUCOSE, POCT (MANUAL RESULT ENTRY): POC Glucose: 132 mg/dl — AB (ref 70–99)

## 2022-05-18 MED ORDER — GABAPENTIN 300 MG PO CAPS
300.0000 mg | ORAL_CAPSULE | Freq: Every day | ORAL | 3 refills | Status: DC
  Filled 2022-05-18 – 2022-06-16 (×2): qty 30, 30d supply, fill #0

## 2022-05-18 NOTE — Patient Instructions (Signed)
Please rreturn to the laboratory later this week for blood draw for blood test.

## 2022-05-18 NOTE — Progress Notes (Signed)
[name], female    DOB: 12/28/1965  MRN: [id number]  CC: Follow-up (Pre-diabetes f/u./Bump on R sole of foot X4 weeks./Received flu vax by C.A.)   Subjective: Shelby Wyatt is a 56 y.o. female who presents for chronic ds Her concerns today include:  Tob dep, fecal incont, HTN, preDM, obesity, colon CA (s/p RT hemicolectomy 05/2021)   Tob dep:  expressed desire to quit on last visit.  Wanted the nicotine gum but it did not work for her because of problems with her teeth -down from 10 cigarettes to 5 a day. Using patches occasionally.   Set quit date by 06/2022.  HTN:  taking Norvasc and Diovan as prescribed Checks BP QOD.  Recent readings 123/74, 135/74 and 127/73 Limits salt No CP/SOB  Hx of colon CA:  did not call Dr. Dema Severin to arrange f/u this mth.  Moving bowels okay.  No blood in the stools.  PreDM/Obesity Results for orders placed or performed in visit on 05/18/22  POCT glucose (manual entry)  Result Value Ref Range   POC Glucose 132 (A) 70 - 99 mg/dl  POCT glycosylated hemoglobin (Hb A1C)  Result Value Ref Range   Hemoglobin A1C     HbA1c POC (<> result, manual entry)     HbA1c, POC (prediabetic range) 6.4 5.7 - 6.4 %   HbA1c, POC (controlled diabetic range)    -taking Metformin Does well with eating habits some wks but other times she has sugar craving Does a  lot of movement at work in the [employer] at [employer]. Not much exercise outside of work  Has a bump on sole of RT foot x 4 wks. sore to touch.  No initiating factors.  Bothered a lot by hot flashes.  She is in the perimenopausal phase.  Last menstrual cycle was 11/2021.  Prior to that it was 05/2021.  HM:  Received flu shot today.  She forgot she she already had it at work 03/05/2022.  Due for 2nd shingles vaccine Patient Active Problem List   Diagnosis Date Noted   Numbness of fingers of both hands 10/12/2021   Colon cancer ([place]) 05/27/2021   Glaucoma suspect 05/27/2021   Serrated polyp  of colon 04/02/2021   Hx of adenomatous colonic polyps 04/02/2021   Cecal polyp 04/02/2021   Abnormal colonoscopy 04/02/2021   Cough due to ACE inhibitor 03/05/2021   Obesity (BMI 30-39.9) 03/05/2021   Essential hypertension 12/25/2020   Prediabetes 12/25/2020   Perimenopausal 12/25/2020   Tobacco dependence 12/01/2020   Incontinence of feces 12/01/2020   Abdominal pain 12/01/2020     Current Outpatient Medications on File Prior to Visit  Medication Sig Dispense Refill   amLODipine (NORVASC) 10 MG tablet Take 1 tablet (10 mg total) by mouth daily. 30 tablet 6   ciclopirox (PENLAC) 8 % solution Apply topically at bedtime. Apply over nail and surrounding skin. Apply daily over previous coat. After seven (7) days, may remove with alcohol and continue cycle. 6.6 mL 1   hydrocortisone 2.5 % cream Apply 1 application topically 2 (two) times daily for 7 days as directed 30 g 0   metFORMIN (GLUCOPHAGE) 500 MG tablet Take 1 tablet (500 mg total) by mouth 2 (two) times daily with a meal. 180 tablet 1   Multiple Vitamins-Minerals (MULTIVITAMIN WITH MINERALS) tablet Take 1 tablet by mouth daily. Centrum 50+     naproxen sodium (ALEVE) 220 MG tablet Take 2 tablets (440 mg total) by mouth daily as  needed (cramps). 30 tablet 0   nicotine (NICODERM CQ - DOSED IN MG/24 HOURS) 14 mg/24hr patch Place 1 patch (14 mg total) onto the skin daily. 28 patch 2   nicotine polacrilex (NICORETTE) 2 MG gum Take 1 each (2 mg total) by mouth as needed for smoking cessation. 110 tablet 2   ondansetron (ZOFRAN) 4 MG tablet Take 1 tablet (4 mg total) by mouth every 8 (eight) hours as needed for nausea or vomiting. 20 tablet 0   valsartan (DIOVAN) 40 MG tablet Take 1 tablet (40 mg total) by mouth daily. 90 tablet 0   No current facility-administered medications on file prior to visit.    Allergies  Allergen Reactions   Lisinopril Cough   Terbinafine And Related Hives    Social History   Socioeconomic History    Marital status: Married    Spouse name: Not on file   Number of children: 0   Years of education: Not on file   Highest education level: 12th grade  Occupational History   Occupation: Food services - [employer]  Tobacco Use   Smoking status: Every Day    Packs/day: 0.25    Types: Cigarettes    Last attempt to quit: 05/02/2021    Years since quitting: 1.0   Smokeless tobacco: Never  Vaping Use   Vaping Use: Never used  Substance and Sexual Activity   Alcohol use: Yes    Comment: occasionally   Drug use: Yes    Types: Marijuana    Comment: "once in a blue moon"   Sexual activity: Yes    Birth control/protection: None  Other Topics Concern   Not on file  Social History Narrative   Not on file   Social Determinants of Health   Financial Resource Strain: Not on file  Food Insecurity: Not on file  Transportation Needs: Not on file  Physical Activity: Not on file  Stress: Not on file  Social Connections: Not on file  Intimate Partner Violence: Not on file    Family History  Problem Relation Age of Onset   Hypertension Mother    Diabetes Mother    Breast cancer Mother    Hypertension Maternal Grandmother    Hypertension Paternal Grandmother    Hypertension Paternal Grandfather    Colon cancer Neg Hx    Esophageal cancer Neg Hx    Rectal cancer Neg Hx    Stomach cancer Neg Hx    Inflammatory bowel disease Neg Hx    Liver disease Neg Hx    Pancreatic cancer Neg Hx     Past Surgical History:  Procedure Laterality Date   COLONOSCOPY     COLONOSCOPY WITH PROPOFOL N/A 04/07/2021   Procedure: COLONOSCOPY WITH PROPOFOL;  Surgeon: Mansouraty, Telford Nab., MD;  Location: [facility];  Service: Gastroenterology;  Laterality: N/A;   ENDOSCOPIC MUCOSAL RESECTION N/A 04/07/2021   Procedure: ENDOSCOPIC MUCOSAL RESECTION;  Surgeon: Rush Landmark Telford Nab., MD;  Location: [facility];  Service: Gastroenterology;  Laterality: N/A;   HEMOSTASIS CLIP PLACEMENT  04/07/2021    Procedure: HEMOSTASIS CLIP PLACEMENT;  Surgeon: Irving Copas., MD;  Location: [facility];  Service: Gastroenterology;;   LAPAROSCOPIC RIGHT HEMI COLECTOMY Right 05/27/2021   Procedure: LAPAROSCOPIC RIGHT HEMI COLECTOMY;  Surgeon: Ileana Roup, MD;  Location: [facility];  Service: General;  Laterality: Right;   laporoscopy     SUBMUCOSAL LIFTING INJECTION  04/07/2021   Procedure: SUBMUCOSAL LIFTING INJECTION;  Surgeon: Irving Copas., MD;  Location: [facility];  Service: Gastroenterology;;   TUBAL LIGATION     WISDOM TOOTH EXTRACTION      ROS: Review of Systems Negative except as stated above  PHYSICAL EXAM: BP 137/76 (BP Location: Left Arm, Patient Position: Sitting, Cuff Size: Normal)   Pulse 83   Temp 98.2 F (36.8 C) (Oral)   Ht '5\' 2"'$  (1.575 m)   Wt 181 lb (82.1 kg)   SpO2 99%   BMI 33.11 kg/m   Wt Readings from Last 3 Encounters:  05/18/22 181 lb (82.1 kg)  03/22/22 183 lb 1.6 oz (83.1 kg)  01/18/22 185 lb 11.2 oz (84.2 kg)    Physical Exam  General appearance - alert, well appearing, and in no distress Mental status - normal mood, behavior, speech, dress, motor activity, and thought processes Neck - supple, no significant adenopathy Chest - clear to auscultation, no wheezes, rales or rhonchi, symmetric air entry Heart - normal rate, regular rhythm, normal S1, S2, no murmurs, rubs, clicks or gallops Extremities - peripheral pulses normal, no pedal edema, no clubbing or cyanosis Skin -right foot: She has a small callus on the ball of the foot.  It is not ulcerated.  It is tender to touch.      Latest Ref Rng & Units 05/30/2021    5:11 AM 05/29/2021    4:27 AM 05/28/2021    4:21 AM  CMP  Glucose 70 - 99 mg/dL 127  106  146   BUN 6 - 20 mg/dL '11  10  8   '$ Creatinine 0.44 - 1.00 mg/dL 0.64  0.57  0.63   Sodium 135 - 145 mmol/L 134  137  135   Potassium 3.5 - 5.1 mmol/L 4.1  3.8  4.3   Chloride 98 - 111 mmol/L 99  103  106   CO2 22 - 32  mmol/L '26  25  23   '$ Calcium 8.9 - 10.3 mg/dL 9.0  9.0  8.5    Lipid Panel     Component Value Date/Time   CHOL 155 12/10/2020 0958   TRIG 60 12/10/2020 0958   HDL 52 12/10/2020 0958   CHOLHDL 3.0 12/10/2020 0958   LDLCALC 91 12/10/2020 0958    CBC    Component Value Date/Time   WBC 10.0 05/30/2021 0511   RBC 4.83 05/30/2021 0511   HGB 13.6 05/30/2021 0511   HGB 12.7 12/10/2020 0958   HCT 41.0 05/30/2021 0511   HCT 38.2 12/10/2020 0958   PLT 275 05/30/2021 0511   PLT 258 12/10/2020 0958   MCV 84.9 05/30/2021 0511   MCV 84 12/10/2020 0958   MCH 28.2 05/30/2021 0511   MCHC 33.2 05/30/2021 0511   RDW 15.2 05/30/2021 0511   RDW 14.7 12/10/2020 0958    ASSESSMENT AND PLAN: 1. Essential hypertension At goal.  Continue Norvasc 10 mg daily and Diovan 40 mg daily. - CBC; Future - Comprehensive metabolic panel; Future - Lipid panel; Future  2. Tobacco dependence Commended her on cutting back.  Continue to encourage her to try to quit.  She is set a quit date for the first of the year.  3. Prediabetes Continue to encourage healthy eating habits and try to get in some exercise activity outside of work.  Continue metformin. - POCT glucose (manual entry) - POCT glycosylated hemoglobin (Hb A1C)  4. Obesity (BMI 30.0-34.9) See #3 above.  5. Hot flash, menopausal Discussed management of hot flashes using HRT versus not on HRT medications.  Discussed the importance of keeping up-to-date  with mammogram if we use HRT.  Patient prefers to try nonhormonal methods first.  Discussed use of SSRI versus gabapentin at bedtime.  She is willing to try the gabapentin first. - gabapentin (NEURONTIN) 300 MG capsule; Take 1 capsule (300 mg total) by mouth at bedtime.  Dispense: 30 capsule; Refill: 3  6. History of colon cancer Will send a referral to the gastroenterologist Dr. Rush Landmark who had done her colonoscopy in the past.  She is due for 1 year follow-up given history of colon cancer.   She will need CEA level and colonoscopy - Ambulatory referral to Gastroenterology  7. Pre-ulcerative corn or callous - Ambulatory referral to Podiatry  8. Need for immunization against influenza Patient had forgotten that she had received the flu vaccine already at work and was given a vaccine today.  I told her there should not be any significant reaction because of this but to monitor. - Flu Vaccine QUAD 63moIM (Fluarix, Fluzone & Alfiuria Quad PF)  9. Need for shingles vaccine Given second Shingrix vaccine today.     Patient was given the opportunity to ask questions.  Patient verbalized understanding of the plan and was able to repeat key elements of the plan.   This documentation was completed using DRadio producer  Any transcriptional errors are unintentional.  Orders Placed This Encounter  Procedures   Flu Vaccine QUAD 631moM (Fluarix, Fluzone & Alfiuria Quad PF)     Requested Prescriptions    No prescriptions requested or ordered in this encounter    No follow-ups on file.  DeKarle PlumberMD, FACP

## 2022-05-24 ENCOUNTER — Ambulatory Visit: Payer: 59 | Attending: Internal Medicine

## 2022-05-24 DIAGNOSIS — I10 Essential (primary) hypertension: Secondary | ICD-10-CM | POA: Diagnosis not present

## 2022-05-25 LAB — COMPREHENSIVE METABOLIC PANEL
ALT: 14 IU/L (ref 0–32)
AST: 12 IU/L (ref 0–40)
Albumin/Globulin Ratio: 1.2 (ref 1.2–2.2)
Albumin: 4 g/dL (ref 3.8–4.9)
Alkaline Phosphatase: 89 IU/L (ref 44–121)
BUN/Creatinine Ratio: 21 (ref 9–23)
BUN: 12 mg/dL (ref 6–24)
Bilirubin Total: <0.2 mg/dL (ref 0.0–1.2)
CO2: 22 mmol/L (ref 20–29)
Calcium: 9.7 mg/dL (ref 8.7–10.2)
Chloride: 102 mmol/L (ref 96–106)
Creatinine, Ser: 0.58 mg/dL (ref 0.57–1.00)
Globulin, Total: 3.3 g/dL (ref 1.5–4.5)
Glucose: 88 mg/dL (ref 70–99)
Potassium: 4.2 mmol/L (ref 3.5–5.2)
Sodium: 136 mmol/L (ref 134–144)
Total Protein: 7.3 g/dL (ref 6.0–8.5)
eGFR: 107 mL/min/{1.73_m2} (ref >59)

## 2022-05-25 LAB — CBC
Hematocrit: 37.9 % (ref 34.0–46.6)
Hemoglobin: 13 g/dL (ref 11.1–15.9)
MCH: 28 pg (ref 26.6–33.0)
MCHC: 34.3 g/dL (ref 31.5–35.7)
MCV: 82 fL (ref 79–97)
Platelets: 305 10*3/uL (ref 150–450)
RBC: 4.65 x10E6/uL (ref 3.77–5.28)
RDW: 14 % (ref 11.7–15.4)
WBC: 9.2 10*3/uL (ref 3.4–10.8)

## 2022-05-25 LAB — LIPID PANEL
Chol/HDL Ratio: 3.4 ratio (ref 0.0–4.4)
Cholesterol, Total: 153 mg/dL (ref 100–199)
HDL: 45 mg/dL (ref >39)
LDL Chol Calc (NIH): 87 mg/dL (ref 0–99)
Triglycerides: 119 mg/dL (ref 0–149)
VLDL Cholesterol Cal: 21 mg/dL (ref 5–40)

## 2022-05-26 ENCOUNTER — Other Ambulatory Visit (HOSPITAL_COMMUNITY): Payer: Self-pay

## 2022-06-08 ENCOUNTER — Ambulatory Visit: Payer: Self-pay | Admitting: Podiatry

## 2022-06-16 ENCOUNTER — Other Ambulatory Visit (HOSPITAL_COMMUNITY): Payer: Self-pay

## 2022-06-16 ENCOUNTER — Other Ambulatory Visit: Payer: Self-pay | Admitting: Internal Medicine

## 2022-06-16 ENCOUNTER — Ambulatory Visit: Payer: Commercial Managed Care - PPO | Admitting: Podiatry

## 2022-06-16 DIAGNOSIS — M79675 Pain in left toe(s): Secondary | ICD-10-CM | POA: Diagnosis not present

## 2022-06-16 DIAGNOSIS — M79674 Pain in right toe(s): Secondary | ICD-10-CM | POA: Diagnosis not present

## 2022-06-16 DIAGNOSIS — B351 Tinea unguium: Secondary | ICD-10-CM

## 2022-06-16 DIAGNOSIS — I10 Essential (primary) hypertension: Secondary | ICD-10-CM

## 2022-06-16 MED ORDER — AMLODIPINE BESYLATE 10 MG PO TABS
10.0000 mg | ORAL_TABLET | Freq: Every day | ORAL | 3 refills | Status: DC
  Filled 2022-06-16: qty 30, 30d supply, fill #0

## 2022-06-16 MED ORDER — VALSARTAN 40 MG PO TABS
40.0000 mg | ORAL_TABLET | Freq: Every day | ORAL | 3 refills | Status: DC
  Filled 2022-06-16: qty 30, 30d supply, fill #0

## 2022-06-16 NOTE — Progress Notes (Signed)
   Chief Complaint  Patient presents with   Foot Problem    Pre-ulcerative corn or callous [facility]    SUBJECTIVE Patient presents to office today complaining of elongated, thickened nails that cause pain while ambulating in shoes.  Patient is unable to trim their own nails. Patient is here for further evaluation and treatment.  Past Medical History:  Diagnosis Date   Allergy    Hypertension    No pertinent past medical history    Pre-diabetes    Tubal pregnancy     Allergies  Allergen Reactions   Lisinopril Cough   Terbinafine And Related Hives     OBJECTIVE General Patient is awake, alert, and oriented x 3 and in no acute distress. Derm Skin is dry and supple bilateral. Negative open lesions or macerations. Remaining integument unremarkable. Nails are tender, long, thickened and dystrophic with subungual debris, consistent with onychomycosis, 1-5 bilateral. No signs of infection noted. Vasc  DP and PT pedal pulses palpable bilaterally. Temperature gradient within normal limits.  Neuro Epicritic and protective threshold sensation grossly intact bilaterally.  Musculoskeletal Exam No symptomatic pedal deformities noted bilateral. Muscular strength within normal limits.  ASSESSMENT 1.  Pain due to onychomycosis of toenails both 2.  Symptomatic skin lesion plantar aspect of the right forefoot  PLAN OF CARE 1. Patient evaluated today.  2. Instructed to maintain good pedal hygiene and foot care.  3. Mechanical debridement of nails 1-5 bilaterally performed using a nail nipper. Filed with dremel without incident.  4.  Excisional debridement of the hyperkeratotic skin lesion was performed using a 312 scalpel without incident or bleeding  5.  Return to clinic in 3 mos For routine footcare  Edrick Kins, DPM [facility]  Dr. Edrick Kins, DPM    [address], [place] [address]                Office [phone]  Fax  [phone]

## 2022-06-17 ENCOUNTER — Telehealth: Payer: Self-pay | Admitting: Podiatry

## 2022-06-17 ENCOUNTER — Other Ambulatory Visit (HOSPITAL_COMMUNITY): Payer: Self-pay

## 2022-06-17 NOTE — Telephone Encounter (Signed)
Patient was suppose to receive medication at her pharmacy for athletes foot. She stated they didn't receive it.  Please advise

## 2022-06-18 ENCOUNTER — Other Ambulatory Visit: Payer: Self-pay

## 2022-06-18 ENCOUNTER — Other Ambulatory Visit (HOSPITAL_COMMUNITY): Payer: Self-pay

## 2022-06-21 ENCOUNTER — Other Ambulatory Visit: Payer: Self-pay | Admitting: Podiatry

## 2022-06-21 ENCOUNTER — Other Ambulatory Visit (HOSPITAL_COMMUNITY): Payer: Self-pay

## 2022-06-21 MED ORDER — CLOTRIMAZOLE-BETAMETHASONE 1-0.05 % EX CREA
1.0000 | TOPICAL_CREAM | Freq: Two times a day (BID) | CUTANEOUS | 1 refills | Status: DC
Start: 2022-06-21 — End: 2022-09-17
  Filled 2022-06-21: qty 45, 23d supply, fill #0

## 2022-06-21 NOTE — Telephone Encounter (Signed)
Rx lotrisone cream sent to pharmacy. Sorry for the delay. Please notify patient. Thanks, Dr. Amalia Hailey

## 2022-06-23 ENCOUNTER — Encounter: Payer: Self-pay | Admitting: Gastroenterology

## 2022-06-24 NOTE — Telephone Encounter (Signed)
Patient has been updated.

## 2022-07-07 ENCOUNTER — Ambulatory Visit: Payer: 59 | Admitting: Registered"

## 2022-07-29 ENCOUNTER — Encounter: Payer: Self-pay | Admitting: Gastroenterology

## 2022-07-29 ENCOUNTER — Ambulatory Visit (AMBULATORY_SURGERY_CENTER): Payer: Commercial Managed Care - PPO | Admitting: *Deleted

## 2022-07-29 ENCOUNTER — Other Ambulatory Visit (HOSPITAL_COMMUNITY): Payer: Self-pay

## 2022-07-29 VITALS — Ht 62.0 in | Wt 180.0 lb

## 2022-07-29 DIAGNOSIS — Z85038 Personal history of other malignant neoplasm of large intestine: Secondary | ICD-10-CM

## 2022-07-29 DIAGNOSIS — Z86010 Personal history of colonic polyps: Secondary | ICD-10-CM

## 2022-07-29 MED ORDER — NA SULFATE-K SULFATE-MG SULF 17.5-3.13-1.6 GM/177ML PO SOLN
1.0000 | Freq: Once | ORAL | 0 refills | Status: AC
  Filled 2022-07-29: qty 354, 1d supply, fill #0

## 2022-07-29 NOTE — Progress Notes (Signed)

## 2022-07-30 ENCOUNTER — Other Ambulatory Visit (HOSPITAL_COMMUNITY): Payer: Self-pay

## 2022-08-12 ENCOUNTER — Encounter: Payer: Self-pay | Admitting: Gastroenterology

## 2022-08-12 ENCOUNTER — Ambulatory Visit (AMBULATORY_SURGERY_CENTER): Payer: Commercial Managed Care - PPO | Admitting: Gastroenterology

## 2022-08-12 VITALS — BP 106/65 | HR 67 | Temp 97.8°F | Resp 13 | Ht 62.0 in | Wt 180.0 lb

## 2022-08-12 DIAGNOSIS — D124 Benign neoplasm of descending colon: Secondary | ICD-10-CM | POA: Diagnosis not present

## 2022-08-12 DIAGNOSIS — Z85038 Personal history of other malignant neoplasm of large intestine: Secondary | ICD-10-CM | POA: Diagnosis not present

## 2022-08-12 DIAGNOSIS — Z08 Encounter for follow-up examination after completed treatment for malignant neoplasm: Secondary | ICD-10-CM | POA: Diagnosis not present

## 2022-08-12 DIAGNOSIS — K635 Polyp of colon: Secondary | ICD-10-CM | POA: Diagnosis not present

## 2022-08-12 DIAGNOSIS — K633 Ulcer of intestine: Secondary | ICD-10-CM | POA: Diagnosis not present

## 2022-08-12 DIAGNOSIS — Z860100 Personal history of colon polyps, unspecified: Secondary | ICD-10-CM

## 2022-08-12 DIAGNOSIS — K9189 Other postprocedural complications and disorders of digestive system: Secondary | ICD-10-CM | POA: Diagnosis not present

## 2022-08-12 MED ORDER — SODIUM CHLORIDE 0.9 % IV SOLN: 500.0000 mL | INTRAVENOUS | Status: DC

## 2022-08-12 NOTE — Op Note (Signed)
[facility] Patient Name: Khadejah Liggon Procedure Date: 08/12/2022 3:16 PM MRN: [id number] Endoscopist: Nicki Reaper E. Candis Schatz , MD, [id number] Age: 57 Referring MD:  Date of Birth: 1966-04-28 Gender: Female Account #: [id number] Procedure:                Colonoscopy Indications:              High risk colon cancer surveillance: Personal                            history of colon cancer. Stage I colon cancer s/p R                            hemicolectomy Dec 2022. No chronic GI symptoms. Medicines:                Monitored Anesthesia Care Procedure:                Pre-Anesthesia Assessment:                           - Prior to the procedure, a History and Physical                            was performed, and patient medications and                            allergies were reviewed. The patient's tolerance of                            previous anesthesia was also reviewed. The risks                            and benefits of the procedure and the sedation                            options and risks were discussed with the patient.                            All questions were answered, and informed consent                            was obtained. Prior Anticoagulants: The patient has                            taken no anticoagulant or antiplatelet agents. ASA                            Grade Assessment: II - A patient with mild systemic                            disease. After reviewing the risks and benefits,                            the patient was deemed in satisfactory condition to  undergo the procedure.                           After obtaining informed consent, the colonoscope                            was passed under direct vision. Throughout the                            procedure, the patient's blood pressure, pulse, and                            oxygen saturations were monitored continuously. The                            CF  [id number] was introduced through the anus                            and advanced to the the ileocolonic anastomosis.                            The colonoscopy was performed without difficulty.                            The patient tolerated the procedure well. The                            quality of the bowel preparation was good. The                            rectum, ileocolonic anastomosis, neoterminal ileum                            were photographed. Scope In: 3:23:15 PM Scope Out: 3:39:56 PM Scope Withdrawal Time: 0 hours 12 minutes 43 seconds  Total Procedure Duration: 0 hours 16 minutes 41 seconds  Findings:                 The perianal and digital rectal examinations were                            normal. Pertinent negatives include normal                            sphincter tone and no palpable rectal lesions.                           There was evidence of a prior side-to-side                            ileo-colonic anastomosis in the proximal transverse                            colon. This was patent and was characterized by  healthy appearing mucosa and visible sutures. The                            anastomosis was traversed.                           Multiple ulcers were found at the ileal side of the                            anastomosis. No bleeding was present. No stigmata                            of recent bleeding were seen. Biopsies were taken                            with a cold forceps for histology. Estimated blood                            loss was minimal.                           A 5 mm polyp was found in the descending colon. The                            polyp was sessile. The polyp was removed with a                            cold snare. Resection and retrieval were complete.                            Estimated blood loss was minimal.                           Multiple sessile polyps were found in the rectum                             and sigmoid colon. The polyps were diminutive in                            size.                           The exam was otherwise normal throughout the                            examined colon.                           The neo-terminal ileum appeared normal.                           The retroflexed view of the distal rectum and anal                            verge  was normal and showed no anal or rectal                            abnormalities. Complications:            No immediate complications. Estimated Blood Loss:     Estimated blood loss was minimal. Impression:               - Patent side-to-side ileo-colonic anastomosis,                            characterized by healthy appearing mucosa and                            visible sutures.                           - Multiple ulcers at the ileal side of the                            ileocolonic anastomosis. These ulcers were limited                            the area proximal to the neoterminal ileum. The                            adjacent colon was normal. Biopsied.                           - One 5 mm polyp in the descending colon, removed                            with a cold snare. Resected and retrieved.                           - Multiple diminutive polyps in the rectum and in                            the sigmoid colon. These were consistent with                            hyperplastic polyps.                           - The examined portion of the ileum was normal.                           - The distal rectum and anal verge are normal on                            retroflexion view. Recommendation:           - Patient has a contact number available for                            emergencies. The signs and symptoms of  potential                            delayed complications were discussed with the                            patient. Return to normal activities tomorrow.                             Written discharge instructions were provided to the                            patient.                           - Resume previous diet.                           - Continue present medications.                           - Await pathology results. Ulcerations at                            anastomosis may be related to ischemia. Very low                            suspicion for Crohn's disease.                           - Repeat colonoscopy in 3 years for surveillance. Angenette Daily E. Candis Schatz, MD 08/12/2022 3:51:02 PM This report has been signed electronically.

## 2022-08-12 NOTE — Patient Instructions (Addendum)
Resume previous diet. Continue present medications. Await pathology results. Ulcerations at anastomosis may be related to ischemia. Very low suspicion for Crohn's disease. Repeat colonoscopy in 3 years for surveillance.  YOU HAD AN ENDOSCOPIC PROCEDURE TODAY AT [facility]:   Refer to the procedure report that was given to you for any specific questions about what was found during the examination.  If the procedure report does not answer your questions, please call your gastroenterologist to clarify.  If you requested that your care partner not be given the details of your procedure findings, then the procedure report has been included in a sealed envelope for you to review at your convenience later.  YOU SHOULD EXPECT: Some feelings of bloating in the abdomen. Passage of more gas than usual.  Walking can help get rid of the air that was put into your GI tract during the procedure and reduce the bloating. If you had a lower endoscopy (such as a colonoscopy or flexible sigmoidoscopy) you may notice spotting of blood in your stool or on the toilet paper. If you underwent a bowel prep for your procedure, you may not have a normal bowel movement for a few days.  Please Note:  You might notice some irritation and congestion in your nose or some drainage.  This is from the oxygen used during your procedure.  There is no need for concern and it should clear up in a day or so.  SYMPTOMS TO REPORT IMMEDIATELY:  Following lower endoscopy (colonoscopy or flexible sigmoidoscopy):  Excessive amounts of blood in the stool  Significant tenderness or worsening of abdominal pains  Swelling of the abdomen that is new, acute  Fever of 100F or higher   For urgent or emergent issues, a gastroenterologist can be reached at any hour by calling [phone]. Do not use MyChart messaging for urgent concerns.    DIET:  We do recommend a small meal at first, but then you may proceed to your regular  diet.  Drink plenty of fluids but you should avoid alcoholic beverages for 24 hours.  ACTIVITY:  You should plan to take it easy for the rest of today and you should NOT DRIVE or use heavy machinery until tomorrow (because of the sedation medicines used during the test).    FOLLOW UP: Our staff will call the number listed on your records the next business day following your procedure.  We will call around 7:15- 8:00 am to check on you and address any questions or concerns that you may have regarding the information given to you following your procedure. If we do not reach you, we will leave a message.     If any biopsies were taken you will be contacted by phone or by letter within the next 1-3 weeks.  Please call us at [phone] if you have not heard about the biopsies in 3 weeks.    SIGNATURES/CONFIDENTIALITY: You and/or your care partner have signed paperwork which will be entered into your electronic medical record.  These signatures attest to the fact that that the information above on your After Visit Summary has been reviewed and is understood.  Full responsibility of the confidentiality of this discharge information lies with you and/or your care-partner.

## 2022-08-12 NOTE — Progress Notes (Signed)
Pt's states no medical or surgical changes since previsit or office visit. 

## 2022-08-12 NOTE — Progress Notes (Signed)
[facility] History and Physical   Primary Care Physician:  Ladell Pier, MD   Reason for Procedure:   Colon cancer surveillance  Plan:    Surveillance colonoscopy     HPI: Shelby Wyatt is a 57 y.o. female undergoing surveillance colonoscopy after being diagnosed with localized colon cancer in Oct 2022.  She is s/p R hemicolectomy Dec 2022.  She has no family history of colon cancer and no chronic GI symptoms.    Past Medical History:  Diagnosis Date   Allergy    seasonal   Anemia    Cancer ([place])    colon, cancer dec 2022   Hypertension    No pertinent past medical history    Pre-diabetes    Tubal pregnancy     Past Surgical History:  Procedure Laterality Date   COLONOSCOPY     COLONOSCOPY WITH PROPOFOL N/A 04/07/2021   Procedure: COLONOSCOPY WITH PROPOFOL;  Surgeon: Rush Landmark Telford Nab., MD;  Location: [facility];  Service: Gastroenterology;  Laterality: N/A;   ENDOSCOPIC MUCOSAL RESECTION N/A 04/07/2021   Procedure: ENDOSCOPIC MUCOSAL RESECTION;  Surgeon: Rush Landmark Telford Nab., MD;  Location: [facility];  Service: Gastroenterology;  Laterality: N/A;   HEMOSTASIS CLIP PLACEMENT  04/07/2021   Procedure: HEMOSTASIS CLIP PLACEMENT;  Surgeon: Irving Copas., MD;  Location: [facility];  Service: Gastroenterology;;   LAPAROSCOPIC RIGHT HEMI COLECTOMY Right 05/27/2021   Procedure: LAPAROSCOPIC RIGHT HEMI COLECTOMY;  Surgeon: Ileana Roup, MD;  Location: [facility];  Service: General;  Laterality: Right;   laporoscopy     [place] INJECTION  04/07/2021   Procedure: SUBMUCOSAL LIFTING INJECTION;  Surgeon: Irving Copas., MD;  Location: [facility];  Service: Gastroenterology;;   TUBAL LIGATION     WISDOM TOOTH EXTRACTION      Prior to Admission medications   Medication Sig Start Date End Date Taking? Authorizing Provider  amLODipine (NORVASC) 10 MG tablet Take 1 tablet (10 mg total) by  mouth daily. 06/16/22  Yes Ladell Pier, MD  clotrimazole-betamethasone (LOTRISONE) cream Apply 1 Application to skin 2 (two) times daily. 06/21/22  Yes Edrick Kins, DPM  hydrocortisone 2.5 % cream Apply 1 application topically 2 (two) times daily for 7 days as directed 07/01/21  Yes   metFORMIN (GLUCOPHAGE) 500 MG tablet Take 1 tablet (500 mg total) by mouth 2 (two) times daily with a meal. 01/14/22  Yes Ladell Pier, MD  valsartan (DIOVAN) 40 MG tablet Take 1 tablet (40 mg total) by mouth daily. 06/16/22  Yes Ladell Pier, MD  ciclopirox (PENLAC) 8 % solution Apply topically at bedtime. Apply over nail and surrounding skin. Apply daily over previous coat. After seven (7) days, may remove with alcohol and continue cycle. 01/12/22   Ladell Pier, MD  gabapentin (NEURONTIN) 300 MG capsule Take 1 capsule (300 mg total) by mouth at bedtime. Patient not taking: Reported on 07/29/2022 05/18/22   Ladell Pier, MD  Multiple Vitamins-Minerals (MULTIVITAMIN WITH MINERALS) tablet Take 1 tablet by mouth daily. Centrum 50+ Patient not taking: Reported on 07/29/2022    [provider]  naproxen sodium (ALEVE) 220 MG tablet Take 2 tablets (440 mg total) by mouth daily as needed (cramps). Patient not taking: Reported on 07/29/2022 04/21/21   Mansouraty, Telford Nab., MD  nicotine (NICODERM CQ - DOSED IN MG/24 HOURS) 14 mg/24hr patch Place 1 patch (14 mg total) onto the skin daily. 04/02/21   Ladell Pier,  MD  nicotine polacrilex (NICORETTE) 2 MG gum Take 1 each (2 mg total) by mouth as needed for smoking cessation. 01/12/22   Ladell Pier, MD    Current Outpatient Medications  Medication Sig Dispense Refill   amLODipine (NORVASC) 10 MG tablet Take 1 tablet (10 mg total) by mouth daily. 30 tablet 3   clotrimazole-betamethasone (LOTRISONE) cream Apply 1 Application to skin 2 (two) times daily. 45 g 1   hydrocortisone 2.5 % cream Apply 1 application topically 2 (two) times daily  for 7 days as directed 30 g 0   metFORMIN (GLUCOPHAGE) 500 MG tablet Take 1 tablet (500 mg total) by mouth 2 (two) times daily with a meal. 180 tablet 1   valsartan (DIOVAN) 40 MG tablet Take 1 tablet (40 mg total) by mouth daily. 30 tablet 3   ciclopirox (PENLAC) 8 % solution Apply topically at bedtime. Apply over nail and surrounding skin. Apply daily over previous coat. After seven (7) days, may remove with alcohol and continue cycle. 6.6 mL 1   gabapentin (NEURONTIN) 300 MG capsule Take 1 capsule (300 mg total) by mouth at bedtime. (Patient not taking: Reported on 07/29/2022) 30 capsule 3   Multiple Vitamins-Minerals (MULTIVITAMIN WITH MINERALS) tablet Take 1 tablet by mouth daily. Centrum 50+ (Patient not taking: Reported on 07/29/2022)     naproxen sodium (ALEVE) 220 MG tablet Take 2 tablets (440 mg total) by mouth daily as needed (cramps). (Patient not taking: Reported on 07/29/2022) 30 tablet 0   nicotine (NICODERM CQ - DOSED IN MG/24 HOURS) 14 mg/24hr patch Place 1 patch (14 mg total) onto the skin daily. 28 patch 2   nicotine polacrilex (NICORETTE) 2 MG gum Take 1 each (2 mg total) by mouth as needed for smoking cessation. 110 tablet 2   Current Facility-Administered Medications  Medication Dose Route Frequency Provider Last Rate Last Admin   0.9 %  sodium chloride infusion  500 mL Intravenous Continuous Daryel November, MD        Allergies as of 08/12/2022 - Review Complete 08/12/2022  Allergen Reaction Noted   Lisinopril Cough 03/05/2021   Terbinafine and related Hives 10/20/2018    Family History  Problem Relation Age of Onset   Hypertension Mother    Diabetes Mother    Breast cancer Mother    Hypertension Maternal Grandmother    Esophageal cancer Paternal Grandmother    Hypertension Paternal Grandmother    Hypertension Paternal Grandfather    Colon cancer Neg Hx    Rectal cancer Neg Hx    Stomach cancer Neg Hx    Inflammatory bowel disease Neg Hx    Liver disease Neg  Hx    Pancreatic cancer Neg Hx    Colon polyps Neg Hx    Crohn's disease Neg Hx    Ulcerative colitis Neg Hx     Social History   Socioeconomic History   Marital status: Married    Spouse name: Not on file   Number of children: 0   Years of education: Not on file   Highest education level: 12th grade  Occupational History   Occupation: Food services - [employer]  Tobacco Use   Smoking status: Every Day    Packs/day: 0.25    Types: Cigarettes   Smokeless tobacco: Never  Vaping Use   Vaping Use: Never used  Substance and Sexual Activity   Alcohol use: Yes    Comment: occasionally   Drug use: Yes    Types: Marijuana  Comment: Smoked at the beginning of month   Sexual activity: Yes    Birth control/protection: None  Other Topics Concern   Not on file  Social History Narrative   Not on file   Social Determinants of Health   Financial Resource Strain: Not on file  Food Insecurity: Not on file  Transportation Needs: Not on file  Physical Activity: Not on file  Stress: Not on file  Social Connections: Not on file  Intimate Partner Violence: Not on file    Review of Systems:  All other review of systems negative except as mentioned in the HPI.  Physical Exam: Vital signs BP 111/66   Pulse 72   Temp 97.8 F (36.6 C)   Ht '5\' 2"'$  (1.575 m)   Wt 180 lb (81.6 kg)   SpO2 99%   BMI 32.92 kg/m   General:   Alert,  Well-developed, well-nourished, pleasant and cooperative in NAD Airway:  Mallampati 1 Lungs:  Clear throughout to auscultation.   Heart:  Regular rate and rhythm; no murmurs, clicks, rubs,  or gallops. Abdomen:  Soft, nontender and nondistended. Normal bowel sounds.   Neuro/Psych:  Normal mood and affect. A and O x 3   Osamu Olguin E. Candis Schatz, MD [facility]

## 2022-08-12 NOTE — Progress Notes (Signed)
Vss nad trans to pacu °

## 2022-08-13 ENCOUNTER — Telehealth: Payer: Self-pay | Admitting: *Deleted

## 2022-08-13 NOTE — Telephone Encounter (Signed)
  Follow up Call-     08/12/2022    2:53 PM 03/03/2021    8:05 AM  Call back number  Post procedure Call Back phone  # [phone]  Permission to leave phone message Yes Yes     Patient questions:  Do you have a fever, pain , or abdominal swelling? No. Pain Score  0 *  Have you tolerated food without any problems? Yes.    Have you been able to return to your normal activities? Yes.    Do you have any questions about your discharge instructions: Diet   No. Medications  No. Follow up visit  No.  Do you have questions or concerns about your Care? No.  Actions: * If pain score is 4 or above: No action needed, pain <4.

## 2022-08-22 NOTE — Progress Notes (Signed)
Shelby Wyatt,  The biopsies of the ulcers near your anastomosis showed benign inflammatory changes.  No features of Crohn's disease or dysplasia were seen.  I do not think any further evaluation or treatment is needed for these small ulcers. The polyp that was removed was completely benign and not precancerous.   Because of your colon cancer history, it is recommended you repeat colonoscopy in 3 years.

## 2022-09-17 ENCOUNTER — Encounter: Payer: Self-pay | Admitting: Internal Medicine

## 2022-09-17 ENCOUNTER — Ambulatory Visit: Payer: Commercial Managed Care - PPO | Attending: Internal Medicine | Admitting: Internal Medicine

## 2022-09-17 VITALS — BP 121/68 | HR 78 | Temp 98.5°F | Ht 62.0 in | Wt 181.0 lb

## 2022-09-17 DIAGNOSIS — M25562 Pain in left knee: Secondary | ICD-10-CM | POA: Diagnosis not present

## 2022-09-17 DIAGNOSIS — I10 Essential (primary) hypertension: Secondary | ICD-10-CM

## 2022-09-17 DIAGNOSIS — R0609 Other forms of dyspnea: Secondary | ICD-10-CM | POA: Diagnosis not present

## 2022-09-17 DIAGNOSIS — L304 Erythema intertrigo: Secondary | ICD-10-CM | POA: Diagnosis not present

## 2022-09-17 DIAGNOSIS — F17200 Nicotine dependence, unspecified, uncomplicated: Secondary | ICD-10-CM | POA: Diagnosis not present

## 2022-09-17 DIAGNOSIS — G8929 Other chronic pain: Secondary | ICD-10-CM | POA: Diagnosis not present

## 2022-09-17 MED ORDER — VALSARTAN 40 MG PO TABS
40.0000 mg | ORAL_TABLET | Freq: Every day | ORAL | 3 refills | Status: DC
  Filled 2022-09-17: qty 30, 30d supply, fill #0
  Filled 2022-11-21: qty 60, 60d supply, fill #1

## 2022-09-17 MED ORDER — AMLODIPINE BESYLATE 10 MG PO TABS
10.0000 mg | ORAL_TABLET | Freq: Every day | ORAL | 3 refills | Status: DC
  Filled 2022-09-17: qty 30, 30d supply, fill #0
  Filled 2022-11-21: qty 60, 60d supply, fill #1

## 2022-09-17 MED ORDER — MICONAZOLE NITRATE 2 % EX CREA
1.0000 | TOPICAL_CREAM | Freq: Two times a day (BID) | CUTANEOUS | 0 refills | Status: AC
Start: 2022-09-17 — End: ?
  Filled 2022-09-17: qty 28.35, 14d supply, fill #0
  Filled 2022-11-21: qty 30, 15d supply, fill #0

## 2022-09-17 NOTE — Progress Notes (Signed)
[name], female    DOB: [birth year]  MRN: [id number]  CC: Hypertension (HTN f/u. Med refills - Amlodipine, valsartan/Intermittent rash in R inner thigh X9 mo /Requesting referral for L knee - giving out & almost falling when going upstairs./SOB when at work x2 mo)   Subjective: Shelby Wyatt is a 57 y.o. female who presents for chronic ds management Her concerns today include:  ob dep, fecal incont, HTN, preDM, obesity, colon CA (s/p RT hemicolectomy 05/2021), Tob dep  HTN:  taking Diovan 40 mg and Norvasc 10 mg daily as prescribed  Hx of colon CA:  Had c-scope since last visit  C/o intermittent rash in R inner thigh X9 mo.  No initiating factors; intermittent  Very itchy especially at nights Applies Vaseline which soothes it  Request referral LT knee  Issue with the knee x 7 yrs.  Injured knee at work 7 yrs ago; knee hit door post; dx with bone contusion and did some P.T sessions at the time Knee has been bothering her since then.  Worse over past 1.5 yrs now that she has job that requires a lot of walking and standing at work.  Works in Development worker, community  at [employer].  Feels knee will give out when walking up and down stairs  C/o SOB walking up the hill at work x 2 mths.  Hill is not that steep, but by the end of it she has to stop and rest.  In the past, she was able to walk up the hill without any issue or having to stop to rest.  No CP when walking.  No LE edema.  Travel to [place] about 3 wks ago.   Still smoking about 5-7 cigarettes a day.  Denies any chronic cough.  No wheezing with exertion. Patient Active Problem List   Diagnosis Date Noted   Numbness of fingers of both hands 10/12/2021   Glaucoma suspect 05/27/2021   Serrated polyp of colon 04/02/2021   Hx of adenomatous colonic polyps 04/02/2021   Cecal polyp 04/02/2021   Abnormal colonoscopy 04/02/2021   Cough due to ACE inhibitor 03/05/2021   Obesity (BMI 30-39.9) 03/05/2021   Essential hypertension  12/25/2020   Prediabetes 12/25/2020   Perimenopausal 12/25/2020   Tobacco dependence 12/01/2020   Incontinence of feces 12/01/2020   Abdominal pain 12/01/2020     Current Outpatient Medications on File Prior to Visit  Medication Sig Dispense Refill   metFORMIN (GLUCOPHAGE) 500 MG tablet Take 1 tablet (500 mg total) by mouth 2 (two) times daily with a meal. 180 tablet 1   Multiple Vitamins-Minerals (MULTIVITAMIN WITH MINERALS) tablet Take 1 tablet by mouth daily. Centrum 50+     naproxen sodium (ALEVE) 220 MG tablet Take 2 tablets (440 mg total) by mouth daily as needed (cramps). 30 tablet 0   nicotine (NICODERM CQ - DOSED IN MG/24 HOURS) 14 mg/24hr patch Place 1 patch (14 mg total) onto the skin daily. 28 patch 2   nicotine polacrilex (NICORETTE) 2 MG gum Take 1 each (2 mg total) by mouth as needed for smoking cessation. 110 tablet 2   gabapentin (NEURONTIN) 300 MG capsule Take 1 capsule (300 mg total) by mouth at bedtime. (Patient not taking: Reported on 07/29/2022) 30 capsule 3   No current facility-administered medications on file prior to visit.    Allergies  Allergen Reactions   Lisinopril Cough   Terbinafine And Related Hives    Social History   Socioeconomic History  Marital status: Married    Spouse name: Not on file   Number of children: 0   Years of education: Not on file   Highest education level: Associate degree: occupational, Scientist, product/process development, or vocational program  Occupational History   Occupation: Food services - [employer]  Tobacco Use   Smoking status: Every Day    Packs/day: .25    Types: Cigarettes   Smokeless tobacco: Never  Vaping Use   Vaping Use: Never used  Substance and Sexual Activity   Alcohol use: Yes    Comment: occasionally   Drug use: Yes    Types: Marijuana    Comment: Smoked at the beginning of month   Sexual activity: Yes    Birth control/protection: None  Other Topics Concern   Not on file  Social History Narrative   Not on file   Social  Determinants of Health   Financial Resource Strain: Low Risk  (09/14/2022)   Overall Financial Resource Strain (CARDIA)    Difficulty of Paying Living Expenses: Not hard at all  Food Insecurity: No Food Insecurity (09/14/2022)   Hunger Vital Sign    Worried About Running Out of Food in the Last Year: Never true    Ran Out of Food in the Last Year: Never true  Transportation Needs: No Transportation Needs (09/14/2022)   PRAPARE - Administrator, Civil Service (Medical): No    Lack of Transportation (Non-Medical): No  Physical Activity: Insufficiently Active (09/14/2022)   Exercise Vital Sign    Days of Exercise per Week: 3 days    Minutes of Exercise per Session: 10 min  Stress: No Stress Concern Present (09/14/2022)   [employer] of Occupational Health - Occupational Stress Questionnaire    Feeling of Stress : Only a little  Social Connections: Moderately Integrated (09/14/2022)   Social Connection and Isolation Panel [NHANES]    Frequency of Communication with Friends and Family: More than three times a week    Frequency of Social Gatherings with Friends and Family: Three times a week    Attends Religious Services: 1 to 4 times per year    Active Member of Clubs or Organizations: No    Attends Engineer, structural: Not on file    Marital Status: Married  Catering manager Violence: Not on file    Family History  Problem Relation Age of Onset   Hypertension Mother    Diabetes Mother    Breast cancer Mother    Hypertension Maternal Grandmother    Esophageal cancer Paternal Grandmother    Hypertension Paternal Grandmother    Hypertension Paternal Grandfather    Colon cancer Neg Hx    Rectal cancer Neg Hx    Stomach cancer Neg Hx    Inflammatory bowel disease Neg Hx    Liver disease Neg Hx    Pancreatic cancer Neg Hx    Colon polyps Neg Hx    Crohn's disease Neg Hx    Ulcerative colitis Neg Hx     Past Surgical History:  Procedure Laterality Date    COLONOSCOPY     COLONOSCOPY WITH PROPOFOL N/A 04/07/2021   Procedure: COLONOSCOPY WITH PROPOFOL;  Surgeon: Mansouraty, Netty Starring., MD;  Location: [facility];  Service: Gastroenterology;  Laterality: N/A;   ENDOSCOPIC MUCOSAL RESECTION N/A 04/07/2021   Procedure: ENDOSCOPIC MUCOSAL RESECTION;  Surgeon: Meridee Score Netty Starring., MD;  Location: [facility];  Service: Gastroenterology;  Laterality: N/A;   HEMOSTASIS CLIP PLACEMENT  04/07/2021   Procedure: HEMOSTASIS CLIP PLACEMENT;  Surgeon: Lemar Lofty., MD;  Location: [facility];  Service: Gastroenterology;;   LAPAROSCOPIC RIGHT HEMI COLECTOMY Right 05/27/2021   Procedure: LAPAROSCOPIC RIGHT HEMI COLECTOMY;  Surgeon: Andria Meuse, MD;  Location: [facility];  Service: General;  Laterality: Right;   laporoscopy     MOUTH SURGERY     SUBMUCOSAL LIFTING INJECTION  04/07/2021   Procedure: SUBMUCOSAL LIFTING INJECTION;  Surgeon: Lemar Lofty., MD;  Location: [facility];  Service: Gastroenterology;;   TUBAL LIGATION     WISDOM TOOTH EXTRACTION      ROS: Review of Systems Negative except as stated above  PHYSICAL EXAM: BP 121/68 (BP Location: Left Arm, Patient Position: Sitting, Cuff Size: Normal)   Pulse 78   Temp 98.5 F (36.9 C) (Oral)   Ht 5\' 2"  (1.575 m)   Wt 181 lb (82.1 kg)   SpO2 99%   BMI 33.11 kg/m   Wt Readings from Last 3 Encounters:  09/17/22 181 lb (82.1 kg)  08/12/22 180 lb (81.6 kg)  07/29/22 180 lb (81.6 kg)    Physical Exam  General appearance - alert, well appearing, middle age AAF and in no distress Mental status - normal mood, behavior, speech, dress, motor activity, and thought processes Neck - supple, no significant adenopathy Chest - clear to auscultation, no wheezes, rales or rhonchi, symmetric air entry Heart - normal rate, regular rhythm, normal S1, S2, no murmurs, rubs, clicks or gallops Musculoskeletal - LT knee: No edema or erythema.  No point tenderness.  Mild clicking  felt on passive range of motion. Skin -CMA Clarissa is present: Hyperpigmentation noted in the right groin area between the upper thigh and the mons pubis.      Latest Ref Rng & Units 05/24/2022    4:29 PM 05/30/2021    5:11 AM 05/29/2021    4:27 AM  CMP  Glucose 70 - 99 mg/dL 88  865  784   BUN 6 - 24 mg/dL 12  11  10    Creatinine 0.57 - 1.00 mg/dL 6.96  2.95  2.84   Sodium 134 - 144 mmol/L 136  134  137   Potassium 3.5 - 5.2 mmol/L 4.2  4.1  3.8   Chloride 96 - 106 mmol/L 102  99  103   CO2 20 - 29 mmol/L 22  26  25    Calcium 8.7 - 10.2 mg/dL 9.7  9.0  9.0   Total Protein 6.0 - 8.5 g/dL 7.3     Total Bilirubin 0.0 - 1.2 mg/dL <1.3     Alkaline Phos 44 - 121 IU/L 89     AST 0 - 40 IU/L 12     ALT 0 - 32 IU/L 14      Lipid Panel     Component Value Date/Time   CHOL 153 05/24/2022 1629   TRIG 119 05/24/2022 1629   HDL 45 05/24/2022 1629   CHOLHDL 3.4 05/24/2022 1629   LDLCALC 87 05/24/2022 1629    CBC    Component Value Date/Time   WBC 9.2 05/24/2022 1629   WBC 10.0 05/30/2021 0511   RBC 4.65 05/24/2022 1629   RBC 4.83 05/30/2021 0511   HGB 13.0 05/24/2022 1629   HCT 37.9 05/24/2022 1629   PLT 305 05/24/2022 1629   MCV 82 05/24/2022 1629   MCH 28.0 05/24/2022 1629   MCH 28.2 05/30/2021 0511   MCHC 34.3 05/24/2022 1629   MCHC 33.2 05/30/2021 0511   RDW 14.0 05/24/2022 1629  ASSESSMENT AND PLAN: 1. Essential hypertension At goal.  Continue amlodipine and Diovan. - amLODipine (NORVASC) 10 MG tablet; Take 1 tablet (10 mg total) by mouth daily.  Dispense: 30 tablet; Refill: 3 - valsartan (DIOVAN) 40 MG tablet; Take 1 tablet (40 mg total) by mouth daily.  Dispense: 30 tablet; Refill: 3  2. Intertrigo Try to keep the area clean and dry. - miconazole (MICATIN) 2 % cream; Apply 1 Application topically 2 (two) times daily.  Dispense: 28.35 g; Refill: 0  3. Tobacco dependence Strongly encouraged her to quit smoking.  4. DOE (dyspnea on exertion) Differential  diagnosis include deconditioning versus angina equivalent. - Ambulatory referral to Cardiology  5. Chronic pain of left knee - Ambulatory referral to Orthopedics - DG Knee Complete 4 Views Left; Future    Patient was given the opportunity to ask questions.  Patient verbalized understanding of the plan and was able to repeat key elements of the plan.   This documentation was completed using Paediatric nurseDragon voice recognition technology.  Any transcriptional errors are unintentional.  Orders Placed This Encounter  Procedures   DG Knee Complete 4 Views Left   Ambulatory referral to Orthopedics   Ambulatory referral to Cardiology     Requested Prescriptions   Signed Prescriptions Disp Refills   amLODipine (NORVASC) 10 MG tablet 30 tablet 3    Sig: Take 1 tablet (10 mg total) by mouth daily.   valsartan (DIOVAN) 40 MG tablet 30 tablet 3    Sig: Take 1 tablet (40 mg total) by mouth daily.   miconazole (MICATIN) 2 % cream 28.35 g 0    Sig: Apply 1 Application topically 2 (two) times daily.    Return in about 4 months (around 01/17/2023).  Jonah Blueeborah Kaeli Nichelson, MD, FACP

## 2022-09-18 ENCOUNTER — Emergency Department (HOSPITAL_COMMUNITY)
Admission: EM | Admit: 2022-09-18 | Discharge: 2022-09-18 | Disposition: A | Discharge status: Home / Self Care | Payer: Commercial Managed Care - PPO | Attending: Emergency Medicine | Admitting: Emergency Medicine

## 2022-09-18 ENCOUNTER — Other Ambulatory Visit: Payer: Self-pay

## 2022-09-18 ENCOUNTER — Emergency Department (HOSPITAL_COMMUNITY): Payer: Commercial Managed Care - PPO

## 2022-09-18 DIAGNOSIS — S61551A Open bite of right wrist, initial encounter: Secondary | ICD-10-CM | POA: Insufficient documentation

## 2022-09-18 DIAGNOSIS — Z79899 Other long term (current) drug therapy: Secondary | ICD-10-CM | POA: Insufficient documentation

## 2022-09-18 DIAGNOSIS — W540XXA Bitten by dog, initial encounter: Secondary | ICD-10-CM | POA: Insufficient documentation

## 2022-09-18 DIAGNOSIS — Z7984 Long term (current) use of oral hypoglycemic drugs: Secondary | ICD-10-CM | POA: Insufficient documentation

## 2022-09-18 DIAGNOSIS — S51851A Open bite of right forearm, initial encounter: Secondary | ICD-10-CM | POA: Diagnosis not present

## 2022-09-18 DIAGNOSIS — S40861A Insect bite (nonvenomous) of right upper arm, initial encounter: Secondary | ICD-10-CM | POA: Diagnosis not present

## 2022-09-18 MED ORDER — AMOXICILLIN-POT CLAVULANATE 875-125 MG PO TABS: 1.0000 | ORAL_TABLET | Freq: Two times a day (BID) | ORAL | 0 refills | Status: DC

## 2022-09-18 MED ORDER — AMOXICILLIN-POT CLAVULANATE 875-125 MG PO TABS
1.0000 | ORAL_TABLET | Freq: Once | ORAL | Status: AC
  Administered 2022-09-18: 1 via ORAL
  Filled 2022-09-18: qty 1

## 2022-09-18 NOTE — ED Provider Notes (Signed)
[place] EMERGENCY DEPARTMENT AT [facility] Provider Note   CSN: [id number] Arrival date & time: 09/18/22  0105     History  Chief Complaint  Patient presents with   Animal Bite    Shelby Wyatt is a 57 y.o. female.  The history is provided by the patient and medical records.  Animal Bite  56 y.o. F here with dog bite to right wrist.  States she was playing around with her dog, squeezed his paw which caused him to bite her right wrist.  Sustained minor injuries but worsening pain so wanted to be evaluated.  Dog is UTD on vaccinations.  Her tetanus is UTD, given last year.  Home Medications Prior to Admission medications   Medication Sig Start Date End Date Taking? Authorizing Provider  amoxicillin-clavulanate (AUGMENTIN) 875-125 MG tablet Take 1 tablet by mouth every 12 (twelve) hours. 09/18/22  Yes Garlon Hatchet, PA-C  amLODipine (NORVASC) 10 MG tablet Take 1 tablet (10 mg total) by mouth daily. 09/17/22   Marcine Matar, MD  gabapentin (NEURONTIN) 300 MG capsule Take 1 capsule (300 mg total) by mouth at bedtime. Patient not taking: Reported on 07/29/2022 05/18/22   Marcine Matar, MD  metFORMIN (GLUCOPHAGE) 500 MG tablet Take 1 tablet (500 mg total) by mouth 2 (two) times daily with a meal. 01/14/22   Marcine Matar, MD  miconazole (MICATIN) 2 % cream Apply 1 Application topically 2 (two) times daily. 09/17/22   Marcine Matar, MD  Multiple Vitamins-Minerals (MULTIVITAMIN WITH MINERALS) tablet Take 1 tablet by mouth daily. Centrum 50+    [provider]  naproxen sodium (ALEVE) 220 MG tablet Take 2 tablets (440 mg total) by mouth daily as needed (cramps). 04/21/21   Mansouraty, Netty Starring., MD  nicotine (NICODERM CQ - DOSED IN MG/24 HOURS) 14 mg/24hr patch Place 1 patch (14 mg total) onto the skin daily. 04/02/21   Marcine Matar, MD  nicotine polacrilex (NICORETTE) 2 MG gum Take 1 each (2 mg total) by mouth as needed for smoking cessation.  01/12/22   Marcine Matar, MD  valsartan (DIOVAN) 40 MG tablet Take 1 tablet (40 mg total) by mouth daily. 09/17/22   Marcine Matar, MD      Allergies    Lisinopril and Terbinafine and related    Review of Systems   Review of Systems  Skin:  Positive for wound.  All other systems reviewed and are negative.   Physical Exam Updated Vital Signs BP 126/66 (BP Location: Left Arm)   Pulse 74   Temp 97.6 F (36.4 C)   Resp 18   SpO2 99%   Physical Exam Vitals and nursing note reviewed.  Constitutional:      Appearance: She is well-developed.  HENT:     Head: Normocephalic and atraumatic.  Eyes:     Conjunctiva/sclera: Conjunctivae normal.     Pupils: Pupils are equal, round, and reactive to light.  Cardiovascular:     Rate and Rhythm: Normal rate and regular rhythm.     Heart sounds: Normal heart sounds.  Pulmonary:     Effort: Pulmonary effort is normal.     Breath sounds: Normal breath sounds.  Abdominal:     General: Bowel sounds are normal.     Palpations: Abdomen is soft.  Musculoskeletal:        General: Normal range of motion.     Cervical back: Normal range of motion.     Comments: Very  small puncture wound noted to right dorsal wrist, there is some mild soft tissue swelling without acute bony deformity, no cellulitic changes present, no bruising, radial pulse intact, hand warm and well perfused  Skin:    General: Skin is warm and dry.  Neurological:     Mental Status: She is alert and oriented to person, place, and time.     ED Results / Procedures / Treatments   Labs (all labs ordered are listed, but only abnormal results are displayed) Labs Reviewed - No data to display  EKG None  Radiology DG Wrist Complete Right  Result Date: 09/18/2022 CLINICAL DATA:  Bitten on the right arm just above the right wrist. EXAM: RIGHT WRIST - COMPLETE 3+ VIEW COMPARISON:  None Available. FINDINGS: There is no evidence of an acute fracture or dislocation. There is  no evidence of arthropathy or other focal bone abnormality. Ill-defined bandaging is seen overlying the distal right radius and distal right ulna. Soft tissues are unremarkable. IMPRESSION: Negative. Electronically Signed   By: Aram Candela M.D.   On: 09/18/2022 01:49    Procedures Procedures    Medications Ordered in ED Medications  amoxicillin-clavulanate (AUGMENTIN) 875-125 MG per tablet 1 tablet (has no administration in time range)    ED Course/ Medical Decision Making/ A&P                             Medical Decision Making Amount and/or Complexity of Data Reviewed Radiology: ordered.  Risk Prescription drug management.   57 year old female here with dog bite to right wrist.  This occurred from her dog that is up-to-date on vaccines.  Her tetanus is also up-to-date.  On exam she has a very small puncture wound to right dorsal wrist, there is soft tissue swelling without bruising or bony deformity.  Her arm/hand neurovascularly intact.  Hand is warm and well-perfused.  X-ray was obtained from triage and is negative.  Will start on prophylactic Augmentin, first dose given here.  Encouraged home wound care and close follow-up with PCP.  Return here for new concerns.  Final Clinical Impression(s) / ED Diagnoses Final diagnoses:  Dog bite, initial encounter    Rx / DC Orders ED Discharge Orders          Ordered    amoxicillin-clavulanate (AUGMENTIN) 875-125 MG tablet  Every 12 hours        09/18/22 0253              Garlon Hatchet, PA-C 09/18/22 3810    Glynn Octave, MD 09/18/22 [phone]

## 2022-09-18 NOTE — Discharge Instructions (Signed)
Take the prescribed medication as directed.  Keep wound clean with soap and warm water twice daily.  Monitor for redness, drainage, fever, etc. Follow-up with your primary care doctor. Return to the ED for new or worsening symptoms.

## 2022-09-18 NOTE — ED Triage Notes (Signed)
Pt reports around 1845, her dog bit her just above the right wrist. Cleaned it with peroxide. She said it did not initially bother her but when she went to bed, her arm started throbbing.  The dogs vaccinations are UTD, pt last tetanus was last year. New dressing applied in triage.

## 2022-09-20 ENCOUNTER — Other Ambulatory Visit (HOSPITAL_COMMUNITY): Payer: Self-pay

## 2022-09-21 ENCOUNTER — Other Ambulatory Visit (INDEPENDENT_AMBULATORY_CARE_PROVIDER_SITE_OTHER): Payer: Commercial Managed Care - PPO

## 2022-09-21 ENCOUNTER — Ambulatory Visit: Payer: Commercial Managed Care - PPO | Attending: Internal Medicine | Admitting: Internal Medicine

## 2022-09-21 ENCOUNTER — Other Ambulatory Visit (HOSPITAL_COMMUNITY): Payer: Self-pay

## 2022-09-21 ENCOUNTER — Encounter: Payer: Self-pay | Admitting: Internal Medicine

## 2022-09-21 ENCOUNTER — Ambulatory Visit: Payer: Commercial Managed Care - PPO | Admitting: Orthopaedic Surgery

## 2022-09-21 VITALS — BP 116/68 | HR 68 | Temp 98.4°F | Ht 62.0 in | Wt 178.0 lb

## 2022-09-21 DIAGNOSIS — M1712 Unilateral primary osteoarthritis, left knee: Secondary | ICD-10-CM

## 2022-09-21 DIAGNOSIS — R7303 Prediabetes: Secondary | ICD-10-CM | POA: Diagnosis not present

## 2022-09-21 DIAGNOSIS — S61551A Open bite of right wrist, initial encounter: Secondary | ICD-10-CM

## 2022-09-21 MED ORDER — METFORMIN HCL 500 MG PO TABS
500.0000 mg | ORAL_TABLET | Freq: Two times a day (BID) | ORAL | 1 refills | Status: DC
Start: 2022-09-21 — End: 2023-01-31
  Filled 2022-09-21: qty 180, 90d supply, fill #0

## 2022-09-21 NOTE — Progress Notes (Signed)
[name], female    DOB: [birth year]  MRN: [id number]  CC: Follow-up (ER f/u. Med refill. Franchot Erichsen dog bite. )   Subjective: Shelby Wyatt is a 57 y.o. female who presents for ER f/u Her concerns today include:  Tob dep, fecal incont, HTN, preDM, obesity, colon CA (s/p RT hemicolectomy 05/2021),   Patient presents today as follow-up from ER visit.  She was seen 09/18/2022 for a dog bite to her right wrist.  It is her dog.  Dog was reportedly up-to-date with vaccines including rabies vaccines.  Patient is up-to-date with Tdap vaccine.  Patient was discharged on Augmentin for 7 days. Pt is taking the med. Pain in wrist has decreased, no redness or swelling.  No pain with movement of wrist jt.  She was concerned because last evening when she removed the Band-Aid, she noticed a little spot of yellow drainage on the Band-Aid.  Today she has it wrapped with gauze and tape.  Requesting refill on metformin.  Patient Active Problem List   Diagnosis Date Noted   Numbness of fingers of both hands 10/12/2021   Glaucoma suspect 05/27/2021   Serrated polyp of colon 04/02/2021   Hx of adenomatous colonic polyps 04/02/2021   Cecal polyp 04/02/2021   Abnormal colonoscopy 04/02/2021   Cough due to ACE inhibitor 03/05/2021   Obesity (BMI 30-39.9) 03/05/2021   Essential hypertension 12/25/2020   Prediabetes 12/25/2020   Perimenopausal 12/25/2020   Tobacco dependence 12/01/2020   Incontinence of feces 12/01/2020   Abdominal pain 12/01/2020     Current Outpatient Medications on File Prior to Visit  Medication Sig Dispense Refill   amLODipine (NORVASC) 10 MG tablet Take 1 tablet (10 mg total) by mouth daily. 30 tablet 3   amoxicillin-clavulanate (AUGMENTIN) 875-125 MG tablet Take 1 tablet by mouth every 12 (twelve) hours. 14 tablet 0   metFORMIN (GLUCOPHAGE) 500 MG tablet Take 1 tablet (500 mg total) by mouth 2 (two) times daily with a meal. 180 tablet 1   miconazole  (MICATIN) 2 % cream Apply 1 Application topically 2 (two) times daily. 28.35 g 0   Multiple Vitamins-Minerals (MULTIVITAMIN WITH MINERALS) tablet Take 1 tablet by mouth daily. Centrum 50+     naproxen sodium (ALEVE) 220 MG tablet Take 2 tablets (440 mg total) by mouth daily as needed (cramps). 30 tablet 0   nicotine (NICODERM CQ - DOSED IN MG/24 HOURS) 14 mg/24hr patch Place 1 patch (14 mg total) onto the skin daily. 28 patch 2   nicotine polacrilex (NICORETTE) 2 MG gum Take 1 each (2 mg total) by mouth as needed for smoking cessation. 110 tablet 2   valsartan (DIOVAN) 40 MG tablet Take 1 tablet (40 mg total) by mouth daily. 30 tablet 3   gabapentin (NEURONTIN) 300 MG capsule Take 1 capsule (300 mg total) by mouth at bedtime. (Patient not taking: Reported on 07/29/2022) 30 capsule 3   No current facility-administered medications on file prior to visit.    Allergies  Allergen Reactions   Lisinopril Cough   Terbinafine And Related Hives    Social History   Socioeconomic History   Marital status: Married    Spouse name: Not on file   Number of children: 0   Years of education: Not on file   Highest education level: Associate degree: occupational, Scientist, product/process development, or vocational program  Occupational History   Occupation: Food services - [employer]  Tobacco Use   Smoking status: Every Day    Packs/day: .  25    Types: Cigarettes   Smokeless tobacco: Never  Vaping Use   Vaping Use: Never used  Substance and Sexual Activity   Alcohol use: Yes    Comment: occasionally   Drug use: Yes    Types: Marijuana    Comment: Smoked at the beginning of month   Sexual activity: Yes    Birth control/protection: None  Other Topics Concern   Not on file  Social History Narrative   Not on file   Social Determinants of Health   Financial Resource Strain: Low Risk  (09/14/2022)   Overall Financial Resource Strain (CARDIA)    Difficulty of Paying Living Expenses: Not hard at all  Food Insecurity: No Food  Insecurity (09/14/2022)   Hunger Vital Sign    Worried About Running Out of Food in the Last Year: Never true    Ran Out of Food in the Last Year: Never true  Transportation Needs: No Transportation Needs (09/14/2022)   PRAPARE - Administrator, Civil Service (Medical): No    Lack of Transportation (Non-Medical): No  Physical Activity: Insufficiently Active (09/14/2022)   Exercise Vital Sign    Days of Exercise per Week: 3 days    Minutes of Exercise per Session: 10 min  Stress: No Stress Concern Present (09/14/2022)   [employer] of Occupational Health - Occupational Stress Questionnaire    Feeling of Stress : Only a little  Social Connections: Moderately Integrated (09/14/2022)   Social Connection and Isolation Panel [NHANES]    Frequency of Communication with Friends and Family: More than three times a week    Frequency of Social Gatherings with Friends and Family: Three times a week    Attends Religious Services: 1 to 4 times per year    Active Member of Clubs or Organizations: No    Attends Engineer, structural: Not on file    Marital Status: Married  Catering manager Violence: Not on file    Family History  Problem Relation Age of Onset   Hypertension Mother    Diabetes Mother    Breast cancer Mother    Hypertension Maternal Grandmother    Esophageal cancer Paternal Grandmother    Hypertension Paternal Grandmother    Hypertension Paternal Grandfather    Colon cancer Neg Hx    Rectal cancer Neg Hx    Stomach cancer Neg Hx    Inflammatory bowel disease Neg Hx    Liver disease Neg Hx    Pancreatic cancer Neg Hx    Colon polyps Neg Hx    Crohn's disease Neg Hx    Ulcerative colitis Neg Hx     Past Surgical History:  Procedure Laterality Date   COLONOSCOPY     COLONOSCOPY WITH PROPOFOL N/A 04/07/2021   Procedure: COLONOSCOPY WITH PROPOFOL;  Surgeon: Mansouraty, Netty Starring., MD;  Location: [facility];  Service: Gastroenterology;  Laterality: N/A;    ENDOSCOPIC MUCOSAL RESECTION N/A 04/07/2021   Procedure: ENDOSCOPIC MUCOSAL RESECTION;  Surgeon: Meridee Score Netty Starring., MD;  Location: [facility];  Service: Gastroenterology;  Laterality: N/A;   HEMOSTASIS CLIP PLACEMENT  04/07/2021   Procedure: HEMOSTASIS CLIP PLACEMENT;  Surgeon: Lemar Lofty., MD;  Location: [facility];  Service: Gastroenterology;;   LAPAROSCOPIC RIGHT HEMI COLECTOMY Right 05/27/2021   Procedure: LAPAROSCOPIC RIGHT HEMI COLECTOMY;  Surgeon: Andria Meuse, MD;  Location: [facility];  Service: General;  Laterality: Right;   laporoscopy     MOUTH SURGERY     SUBMUCOSAL LIFTING INJECTION  04/07/2021   Procedure: SUBMUCOSAL LIFTING INJECTION;  Surgeon: Lemar LoftyMansouraty, Gabriel Jr., MD;  [facility];  Service: Gastroenterology;;   TUBAL LIGATION     WISDOM TOOTH EXTRACTION      ROS: Review of Systems Negative except as stated above  PHYSICAL EXAM: BP 116/68 (BP Location: Left Arm, Patient Position: Sitting, Cuff Size: Normal)   Pulse 68   Temp 98.4 F (36.9 C) (Oral)   Ht 5\' 2"  (1.575 m)   Wt 178 lb (80.7 kg)   SpO2 100%   BMI 32.56 kg/m   Physical Exam  General appearance - alert, well appearing, and in no distress Mental status - normal mood, behavior, speech, dress, motor activity, and thought processes Musculoskeletal -right wrist: No edema or erythema noted.  She has tiny spot noted on the dorsal surface of the right wrist.  There is no fluctuance.  There is no tenderness on palpation around the area.  No fluid expressed.  She has good range of motion of the wrist.      Latest Ref Rng & Units 05/24/2022    4:29 PM 05/30/2021    5:11 AM 05/29/2021    4:27 AM  CMP  Glucose 70 - 99 mg/dL 88  098127  119106   BUN 6 - 24 mg/dL 12  11  10    Creatinine 0.57 - 1.00 mg/dL 1.470.58  8.290.64  5.620.57   Sodium 134 - 144 mmol/L 136  134  137   Potassium 3.5 - 5.2 mmol/L 4.2  4.1  3.8   Chloride 96 - 106 mmol/L 102  99  103   CO2 20 - 29 mmol/L 22  26  25     Calcium 8.7 - 10.2 mg/dL 9.7  9.0  9.0   Total Protein 6.0 - 8.5 g/dL 7.3     Total Bilirubin 0.0 - 1.2 mg/dL <1.3<0.2     Alkaline Phos 44 - 121 IU/L 89     AST 0 - 40 IU/L 12     ALT 0 - 32 IU/L 14      Lipid Panel     Component Value Date/Time   CHOL 153 05/24/2022 1629   TRIG 119 05/24/2022 1629   HDL 45 05/24/2022 1629   CHOLHDL 3.4 05/24/2022 1629   LDLCALC 87 05/24/2022 1629    CBC    Component Value Date/Time   WBC 9.2 05/24/2022 1629   WBC 10.0 05/30/2021 0511   RBC 4.65 05/24/2022 1629   RBC 4.83 05/30/2021 0511   HGB 13.0 05/24/2022 1629   HCT 37.9 05/24/2022 1629   PLT 305 05/24/2022 1629   MCV 82 05/24/2022 1629   MCH 28.0 05/24/2022 1629   MCH 28.2 05/30/2021 0511   MCHC 34.3 05/24/2022 1629   MCHC 33.2 05/30/2021 0511   RDW 14.0 05/24/2022 1629    ASSESSMENT AND PLAN:  1. Animal bite of right wrist Area does not appear overtly infected but I have advised her to continue the antibiotics to completion.  Advised that if she wants to keep the area covered she should use a small breathable gauze over it.  2. Prediabetes - metFORMIN (GLUCOPHAGE) 500 MG tablet; Take 1 tablet (500 mg total) by mouth 2 (two) times daily with a meal.  Dispense: 180 tablet; Refill: 1    Patient was given the opportunity to ask questions.  Patient verbalized understanding of the plan and was able to repeat key elements of the plan.   This documentation was completed using Dragon voice  Advertising copywriter.  Any transcriptional errors are unintentional.  No orders of the defined types were placed in this encounter.    Requested Prescriptions   Pending Prescriptions Disp Refills   metFORMIN (GLUCOPHAGE) 500 MG tablet 180 tablet 1    Sig: Take 1 tablet (500 mg total) by mouth 2 (two) times daily with a meal.    No follow-ups on file.  Jonah Blue, MD, FACP

## 2022-09-21 NOTE — Progress Notes (Unsigned)
Office Visit Note   Patient: Shelby Wyatt           Date of Birth: March 26, 1966           MRN: [id number] Visit Date: 09/21/2022              Requested by: Marcine Matar, MD [address] [place],  [place] [address] PCP: Marcine Matar, MD   Assessment & Plan: Visit Diagnoses:  1. Unilateral primary osteoarthritis, left knee     Plan: Impression is left knee arthritis flareup.  Today, we discussed various treatment options to include oral/topical NSAIDs versus cortisone injection in addition to PT. I have sent in a referral for physical therapy we have provided her with a topical Voltaren handout.  Follow-Up Instructions: Return if symptoms worsen or fail to improve.   Orders:  Orders Placed This Encounter  Procedures   XR KNEE 3 VIEW LEFT   No orders of the defined types were placed in this encounter.     Procedures: No procedures performed   Clinical Data: No additional findings.   Subjective: Chief Complaint  Patient presents with   Left Knee - Pain    HPI patient is a pleasant 57 year old female who comes in today with left knee pain.  She initially injured her knee hitting it on a door frame with subsequent contusion back in 2017.  Symptoms improved until recently.  The pain she has is deep within the knee.  She notes intermittent pain but worse with walking, standing and stair climbing.  She denies mechanical symptoms.  She has been wearing a knee brace without significant relief.  She occasionally takes Aleve if her symptoms are really bad which does help.  No previous cortisone injection.  Review of Systems as detailed in HPI.  All others reviewed and are negative.   Objective: Vital Signs: There were no vitals taken for this visit.  Physical Exam well-developed well-nourished female no acute distress.  Alert and oriented x 3.  Ortho Exam left knee exam shows no effusion.  Range of motion 0 to 125 degrees.  No joint line  tenderness.  No patellofemoral crepitus.  Ligaments are stable.  She is neurovascular intact distally.  Specialty Comments:  No specialty comments available.  Imaging: XR KNEE 3 VIEW LEFT  Result Date: 09/21/2022 Moderate joint space narrowing in all 3 compartments    PMFS History: Patient Active Problem List   Diagnosis Date Noted   Numbness of fingers of both hands 10/12/2021   Glaucoma suspect 05/27/2021   Serrated polyp of colon 04/02/2021   Hx of adenomatous colonic polyps 04/02/2021   Cecal polyp 04/02/2021   Abnormal colonoscopy 04/02/2021   Cough due to ACE inhibitor 03/05/2021   Obesity (BMI 30-39.9) 03/05/2021   Essential hypertension 12/25/2020   Prediabetes 12/25/2020   Perimenopausal 12/25/2020   Tobacco dependence 12/01/2020   Incontinence of feces 12/01/2020   Abdominal pain 12/01/2020   Past Medical History:  Diagnosis Date   Allergy    seasonal   Anemia    Cancer    colon, cancer dec 2022   Hypertension    No pertinent past medical history    Pre-diabetes    Tubal pregnancy     Family History  Problem Relation Age of Onset   Hypertension Mother    Diabetes Mother    Breast cancer Mother    Hypertension Maternal Grandmother    Esophageal cancer Paternal Grandmother    Hypertension  Paternal Grandmother    Hypertension Paternal Grandfather    Colon cancer Neg Hx    Rectal cancer Neg Hx    Stomach cancer Neg Hx    Inflammatory bowel disease Neg Hx    Liver disease Neg Hx    Pancreatic cancer Neg Hx    Colon polyps Neg Hx    Crohn's disease Neg Hx    Ulcerative colitis Neg Hx     Past Surgical History:  Procedure Laterality Date   COLONOSCOPY     COLONOSCOPY WITH PROPOFOL N/A 04/07/2021   Procedure: COLONOSCOPY WITH PROPOFOL;  Surgeon: Meridee Score Netty Starring., MD;  Location: [facility];  Service: Gastroenterology;  Laterality: N/A;   ENDOSCOPIC MUCOSAL RESECTION N/A 04/07/2021   Procedure: ENDOSCOPIC MUCOSAL RESECTION;  Surgeon:  Meridee Score Netty Starring., MD;  Location: [facility];  Service: Gastroenterology;  Laterality: N/A;   HEMOSTASIS CLIP PLACEMENT  04/07/2021   Procedure: HEMOSTASIS CLIP PLACEMENT;  Surgeon: Lemar Lofty., MD;  Location: [facility];  Service: Gastroenterology;;   LAPAROSCOPIC RIGHT HEMI COLECTOMY Right 05/27/2021   Procedure: LAPAROSCOPIC RIGHT HEMI COLECTOMY;  Surgeon: Andria Meuse, MD;  Location: [facility];  Service: General;  Laterality: Right;   laporoscopy     MOUTH SURGERY     SUBMUCOSAL LIFTING INJECTION  04/07/2021   Procedure: SUBMUCOSAL LIFTING INJECTION;  Surgeon: Lemar Lofty., MD;  Location: [facility];  Service: Gastroenterology;;   TUBAL LIGATION     WISDOM TOOTH EXTRACTION     Social History   Occupational History   Occupation: Food services - [employer]  Tobacco Use   Smoking status: Every Day    Packs/day: .25    Types: Cigarettes   Smokeless tobacco: Never  Vaping Use   Vaping Use: Never used  Substance and Sexual Activity   Alcohol use: Yes    Comment: occasionally   Drug use: Yes    Types: Marijuana    Comment: Smoked at the beginning of month   Sexual activity: Yes    Birth control/protection: None

## 2022-09-21 NOTE — Patient Instructions (Signed)
The left wrist looks good.  You should continue and complete the Augmentin antibiotic that was given.  Follow-up if any worsening or increase pain/swelling in the wrist.

## 2022-09-22 ENCOUNTER — Encounter: Payer: Self-pay | Admitting: Podiatry

## 2022-09-22 ENCOUNTER — Ambulatory Visit: Payer: Commercial Managed Care - PPO | Admitting: Podiatry

## 2022-09-22 DIAGNOSIS — Q828 Other specified congenital malformations of skin: Secondary | ICD-10-CM

## 2022-09-22 DIAGNOSIS — R7303 Prediabetes: Secondary | ICD-10-CM

## 2022-09-22 DIAGNOSIS — B351 Tinea unguium: Secondary | ICD-10-CM | POA: Diagnosis not present

## 2022-09-22 DIAGNOSIS — M79674 Pain in right toe(s): Secondary | ICD-10-CM

## 2022-09-22 DIAGNOSIS — M79675 Pain in left toe(s): Secondary | ICD-10-CM | POA: Diagnosis not present

## 2022-09-22 DIAGNOSIS — M79671 Pain in right foot: Secondary | ICD-10-CM

## 2022-09-22 DIAGNOSIS — M79672 Pain in left foot: Secondary | ICD-10-CM

## 2022-09-22 NOTE — Progress Notes (Signed)
  Subjective:  [name], female    DOB: 05/08/1966,  MRN: [id number]  Shelby Wyatt presents to clinic today for painful porokeratotic lesion(s) both feet and painful mycotic toenails that limit ambulation. Painful toenails interfere with ambulation. Aggravating factors include wearing enclosed shoe gear. Pain is relieved with periodic professional debridement. Painful porokeratotic lesions are aggravated when weightbearing with and without shoegear. Pain is relieved with periodic professional debridement.  Chief Complaint  Patient presents with   NAIL CARE    RFC    Callouses    CALLUS BILAT    New problem(s): None.   PCP is Marcine Matar, MD.  Allergies  Allergen Reactions   Augmentin [Amoxicillin-Pot Clavulanate] Other (See Comments)    Significant abdominal pain, diarrhea, following Augmentin specifically (not penicillin allergy)   Lisinopril Cough   Terbinafine And Related Hives    Review of Systems: Negative except as noted in the HPI.  Objective: No changes noted in today's physical examination. There were no vitals filed for this visit. Shelby Wyatt is a pleasant 58 y.o. female WD, WN in NAD. AAO x 3.   Vascular Examination: Capillary refill time immediate b/l. Vascular status intact b/l with palpable pedal pulses. Pedal hair present b/l. No edema. No pain with calf compression b/l. Skin temperature gradient WNL b/l. No ischemia or gangrene noted b/l LE. No cyanosis or clubbing noted b/l LE.  Neurological Examination: Sensation grossly intact b/l with 10 gram monofilament. Vibratory sensation intact b/l.   Dermatological Examination: Pedal skin with normal turgor, texture and tone b/l.  No open wounds. No interdigital macerations.   Toenails 1-5 b/l thick, discolored, elongated with subungual debris and pain on dorsal palpation.   Porokeratotic lesion(s) plantar midfoot b/l. No erythema, no edema, no drainage, no  fluctuance.  Musculoskeletal Examination: Normal muscle strength 5/5 to all lower extremity muscle groups bilaterally. No pain, crepitus or joint limitation noted with ROM b/l LE. No gross bony pedal deformities b/l. Patient ambulates independently without assistive aids.  Radiographs: None  Last A1c:      Latest Ref Rng & Units 05/18/2022    4:33 PM 10/12/2021    3:53 PM  Hemoglobin A1C  Hemoglobin-A1c 5.7 - 6.4 % 6.4  C 6.4     C Corrected result   Assessment/Plan: 1. Pain due to onychomycosis of toenails of both feet   2. Porokeratosis   3. Pain in both feet   4. Prediabetes    -Patient was evaluated and treated. All patient's and/or POA's questions/concerns answered on today's visit. -Patient to continue soft, supportive shoe gear daily. -Toenails 1-5 b/l were debrided in length and girth with sterile nail nippers and dremel without iatrogenic bleeding.  -Porokeratotic lesion(s) plantar midfoot b/l pared and enucleated with sterile currette without incident. Total number of lesions debrided=2. -Shoe recommendations given for Oofos. -Patient/POA to call should there be question/concern in the interim.   Return in about 3 months (around 12/22/2022).  Freddie Breech, DPM

## 2022-09-24 ENCOUNTER — Encounter (HOSPITAL_COMMUNITY): Payer: Self-pay

## 2022-09-24 ENCOUNTER — Emergency Department (HOSPITAL_COMMUNITY): Payer: Commercial Managed Care - PPO

## 2022-09-24 ENCOUNTER — Other Ambulatory Visit: Payer: Self-pay

## 2022-09-24 ENCOUNTER — Ambulatory Visit: Payer: Self-pay

## 2022-09-24 ENCOUNTER — Emergency Department (HOSPITAL_COMMUNITY)
Admission: EM | Admit: 2022-09-24 | Discharge: 2022-09-24 | Disposition: A | Discharge status: Home / Self Care | Payer: Commercial Managed Care - PPO | Attending: Emergency Medicine | Admitting: Emergency Medicine

## 2022-09-24 ENCOUNTER — Other Ambulatory Visit (HOSPITAL_COMMUNITY): Payer: Self-pay

## 2022-09-24 DIAGNOSIS — I10 Essential (primary) hypertension: Secondary | ICD-10-CM | POA: Diagnosis not present

## 2022-09-24 DIAGNOSIS — R1011 Right upper quadrant pain: Secondary | ICD-10-CM | POA: Diagnosis not present

## 2022-09-24 DIAGNOSIS — R1031 Right lower quadrant pain: Secondary | ICD-10-CM | POA: Diagnosis not present

## 2022-09-24 DIAGNOSIS — K760 Fatty (change of) liver, not elsewhere classified: Secondary | ICD-10-CM | POA: Diagnosis not present

## 2022-09-24 DIAGNOSIS — R109 Unspecified abdominal pain: Secondary | ICD-10-CM

## 2022-09-24 LAB — CBC
HCT: 39.9 % (ref 36.0–46.0)
Hemoglobin: 13.6 g/dL (ref 12.0–15.0)
MCH: 28.4 pg (ref 26.0–34.0)
MCHC: 34.1 g/dL (ref 30.0–36.0)
MCV: 83.3 fL (ref 80.0–100.0)
Platelets: 245 10*3/uL (ref 150–400)
RBC: 4.79 MIL/uL (ref 3.87–5.11)
RDW: 15.4 % (ref 11.5–15.5)
WBC: 6.7 10*3/uL (ref 4.0–10.5)
nRBC: 0 % (ref 0.0–0.2)

## 2022-09-24 LAB — TYPE AND SCREEN
Unit division: 0
Unit division: 0

## 2022-09-24 LAB — BPAM RBC
Blood Product Expiration Date: 202404262359
Unit Type and Rh: 7300

## 2022-09-24 LAB — POC OCCULT BLOOD, ED: Fecal Occult Bld: NEGATIVE

## 2022-09-24 LAB — COMPREHENSIVE METABOLIC PANEL
ALT: 17 U/L (ref 0–44)
AST: 19 U/L (ref 15–41)
Albumin: 3.4 g/dL — ABNORMAL LOW (ref 3.5–5.0)
Alkaline Phosphatase: 73 U/L (ref 38–126)
Anion gap: 7 (ref 5–15)
BUN: 10 mg/dL (ref 6–20)
CO2: 23 mmol/L (ref 22–32)
Calcium: 8.9 mg/dL (ref 8.9–10.3)
Chloride: 108 mmol/L (ref 98–111)
Creatinine, Ser: 0.59 mg/dL (ref 0.44–1.00)
GFR, Estimated: >60 mL/min (ref >60)
Glucose, Bld: 135 mg/dL — ABNORMAL HIGH (ref 70–99)
Potassium: 3.9 mmol/L (ref 3.5–5.1)
Sodium: 138 mmol/L (ref 135–145)
Total Bilirubin: 0.7 mg/dL (ref 0.3–1.2)
Total Protein: 7.4 g/dL (ref 6.5–8.1)

## 2022-09-24 LAB — I-STAT BETA HCG BLOOD, ED (MC, WL, AP ONLY): I-stat hCG, quantitative: <5 m[IU]/mL (ref <5)

## 2022-09-24 LAB — LIPASE, BLOOD: Lipase: 45 U/L (ref 11–51)

## 2022-09-24 MED ORDER — OXYCODONE-ACETAMINOPHEN 5-325 MG PO TABS
1.0000 | ORAL_TABLET | Freq: Once | ORAL | Status: AC
  Administered 2022-09-24: 1 via ORAL
  Filled 2022-09-24: qty 1

## 2022-09-24 MED ORDER — ONDANSETRON 4 MG PO TBDP
4.0000 mg | ORAL_TABLET | Freq: Three times a day (TID) | ORAL | 0 refills | Status: DC | PRN
  Filled 2022-09-24: qty 12, 4d supply, fill #0

## 2022-09-24 MED ORDER — OXYCODONE-ACETAMINOPHEN 5-325 MG PO TABS
1.0000 | ORAL_TABLET | Freq: Three times a day (TID) | ORAL | 0 refills | Status: DC | PRN
  Filled 2022-09-24: qty 4, 2d supply, fill #0

## 2022-09-24 MED ORDER — ONDANSETRON HCL 4 MG/2ML IJ SOLN
4.0000 mg | Freq: Once | INTRAMUSCULAR | Status: AC
  Administered 2022-09-24: 4 mg via INTRAVENOUS
  Filled 2022-09-24: qty 2

## 2022-09-24 MED ORDER — SENNOSIDES-DOCUSATE SODIUM 8.6-50 MG PO TABS
1.0000 | ORAL_TABLET | Freq: Every day | ORAL | 0 refills | Status: DC | PRN
  Filled 2022-09-24: qty 12, 12d supply, fill #0

## 2022-09-24 MED ORDER — HYDROMORPHONE HCL 1 MG/ML IJ SOLN
0.5000 mg | Freq: Once | INTRAMUSCULAR | Status: AC
  Administered 2022-09-24: 0.5 mg via INTRAVENOUS
  Filled 2022-09-24: qty 1

## 2022-09-24 MED ORDER — IOHEXOL 350 MG/ML SOLN
75.0000 mL | Freq: Once | INTRAVENOUS | Status: AC | PRN
  Administered 2022-09-24: 75 mL via INTRAVENOUS

## 2022-09-24 NOTE — Telephone Encounter (Signed)
  Chief Complaint: Lambert Mody upper right abdominal pain Symptoms: above Frequency: Yesterday mild - now when it hits pain is 10/10 Pertinent Negatives: Patient denies fever Disposition: [x] ED /[] Urgent Care (no appt availability in office) / [] Appointment(In office/virtual)/ []  [facility]/ [] Home Care/ [] Refused Recommended Disposition /[] [facility]/ []  Follow-up with PCP Additional Notes: PT reports that she is taking an antibiotic and thinks this may be causing the issue. PT reports that yesterday she started experiencing right upper abdominal pain and was intermittent and sharp. Pain has increased in each episode. Pt reports mild relief with position change. Pt also reports dark stools.  Reason for Disposition  [1] SEVERE pain (e.g., excruciating) AND [2] present > 1 hour  Answer Assessment - Initial Assessment Questions 1. LOCATION: "Where does it hurt?"      Right side - upper under right breast 2. RADIATION: "Does the pain shoot anywhere else?" (e.g., chest, back)     no 3. ONSET: "When did the pain begin?" (e.g., minutes, hours or days ago)      Started yesterday  -mild, now 10/10 sharp pain 4. SUDDEN: "Gradual or sudden onset?"     Gradual 5. PATTERN "Does the pain come and go, or is it constant?"    - If it comes and goes: "How long does it last?" "Do you have pain now?"     (Note: Comes and goes means the pain is intermittent. It goes away completely between bouts.)    - If constant: "Is it getting better, staying the same, or getting worse?"      (Note: Constant means the pain never goes away completely; most serious pain is constant and gets worse.)      Comes and goes 6. SEVERITY: "How bad is the pain?"  (e.g., Scale 1-10; mild, moderate, or severe)    - MILD (1-3): Doesn't interfere with normal activities, abdomen soft and not tender to touch.     - MODERATE (4-7): Interferes with normal activities or awakens from sleep, abdomen tender to touch.     -  SEVERE (8-10): Excruciating pain, doubled over, unable to do any normal activities.       10/10 7. RECURRENT SYMPTOM: "Have you ever had this type of stomach pain before?" If Yes, ask: "When was the last time?" and "What happened that time?"      Years ago  8. CAUSE: "What do you think is causing the stomach pain?"     Clavamox 9. RELIEVING/AGGRAVATING FACTORS: "What makes it better or worse?" (e.g., antacids, bending or twisting motion, bowel movement)     Standing still or leaning to the right makes it better 10. OTHER SYMPTOMS: "Do you have any other symptoms?" (e.g., back pain, diarrhea, fever, urination pain, vomiting)       Felt nauseous - dark stools  Protocols used: Abdominal Pain - Female-A-AH

## 2022-09-24 NOTE — ED Notes (Signed)
US at bedside

## 2022-09-24 NOTE — ED Notes (Signed)
Provider at bedside

## 2022-09-24 NOTE — ED Triage Notes (Signed)
Pt came in via POV d/t [facility] Rt sided abd pain that gradually started 2 days ago, the same day she began ABT for dog bite (that she was seen here for). States it worsened yesterday & today the abd is intermittently rated 1/10. Does endorse some lightheadedness & dark stools the entire time as well. A/Ox4.

## 2022-09-24 NOTE — Telephone Encounter (Signed)
Noted  

## 2022-09-24 NOTE — Discharge Instructions (Addendum)
You may be having some irritation of your bowels, which is possibly related to a side effect from her medication.  Augmentin is known to cause stomach upset.  You can discontinue the antibiotic at this time.  You should stick to a soft food or liquid diet for the next 2 days, including soups or broths.  You can also have saltine crackers, and light foods or small snacks.  I expect your pain to improve over the next 1 to 2 days.  I did prescribe you a few tablets of stronger pain medication for the next 24 hours, but your symptoms should improve now that you are off of the Augmentin.  I also prescribed you Zofran for nausea, as well as Peri-Colace which is a gentle stool softener and laxative, to help prevent constipation.  Please contact your doctor's office to arrange for follow-up visit.  If your abdominal pain is getting worse, you have persistent vomiting, please return to the ER.

## 2022-09-24 NOTE — ED Provider Notes (Signed)
[place] EMERGENCY DEPARTMENT AT [facility] Provider Note   CSN: [id number] Arrival date & time: 09/24/22  5498     History  No chief complaint on file.   Shelby Wyatt is a 57 y.o. female present emergency department complaint of abdominal pain and dark stool.  Patient reports that she was bit by her dog about a week ago, near the right wrist.  She was prescribed Augmentin which she began taking 4 days ago on Monday.  She said around the same time she started she began having cramping pain in her abdomen and noted dark stools.  She is now having persistent and more severe pain particularly in the right upper quadrant of her abdomen, with nausea, no vomiting.  She reports a history of a right-sided hemicolectomy from colon cancer 2 years ago, but no other abdominal surgeries.  HPI     Home Medications Prior to Admission medications   Medication Sig Start Date End Date Taking? Authorizing Provider  ondansetron (ZOFRAN-ODT) 4 MG disintegrating tablet Take 1 tablet (4 mg total) by mouth every 8 (eight) hours as needed for nausea or vomiting for up to 12 doses 09/24/22  Yes Idella Lamontagne, Kermit Balo, MD  oxyCODONE-acetaminophen (PERCOCET/ROXICET) 5-325 MG tablet Take 1 tablet by mouth every 8 (eight) hours as needed for severe pain. 09/24/22  Yes Caterine Mcmeans, Kermit Balo, MD  senna-docusate (SENOKOT-S) 8.6-50 MG tablet Take 1 tablet by mouth daily as needed for mild constipation. 09/24/22  Yes Terald Sleeper, MD  amLODipine (NORVASC) 10 MG tablet Take 1 tablet (10 mg total) by mouth daily. 09/17/22   Marcine Matar, MD  gabapentin (NEURONTIN) 300 MG capsule Take 1 capsule (300 mg total) by mouth at bedtime. Patient not taking: Reported on 07/29/2022 05/18/22   Marcine Matar, MD  metFORMIN (GLUCOPHAGE) 500 MG tablet Take 1 tablet (500 mg total) by mouth 2 (two) times daily with a meal. 09/21/22   Marcine Matar, MD  miconazole (MICATIN) 2 % cream Apply 1 Application topically 2  (two) times daily. 09/17/22   Marcine Matar, MD  Multiple Vitamins-Minerals (MULTIVITAMIN WITH MINERALS) tablet Take 1 tablet by mouth daily. Centrum 50+    [provider]  naproxen sodium (ALEVE) 220 MG tablet Take 2 tablets (440 mg total) by mouth daily as needed (cramps). 04/21/21   Mansouraty, Netty Starring., MD  nicotine (NICODERM CQ - DOSED IN MG/24 HOURS) 14 mg/24hr patch Place 1 patch (14 mg total) onto the skin daily. 04/02/21   Marcine Matar, MD  nicotine polacrilex (NICORETTE) 2 MG gum Take 1 each (2 mg total) by mouth as needed for smoking cessation. 01/12/22   Marcine Matar, MD  valsartan (DIOVAN) 40 MG tablet Take 1 tablet (40 mg total) by mouth daily. 09/17/22   Marcine Matar, MD      Allergies    Augmentin [amoxicillin-pot clavulanate], Lisinopril, and Terbinafine and related    Review of Systems   Review of Systems  Physical Exam Updated Vital Signs BP 116/63   Pulse 66   Temp 98.1 F (36.7 C) (Oral)   Resp 20   Ht 5\' 2"  (1.575 m)   Wt 80.7 kg   SpO2 99%   BMI 32.56 kg/m  Physical Exam Constitutional:      General: She is not in acute distress. HENT:     Head: Normocephalic and atraumatic.  Eyes:     Conjunctiva/sclera: Conjunctivae normal.     Pupils: Pupils are equal,  round, and reactive to light.  Cardiovascular:     Rate and Rhythm: Normal rate and regular rhythm.  Pulmonary:     Effort: Pulmonary effort is normal. No respiratory distress.  Abdominal:     General: There is no distension.     Tenderness: There is abdominal tenderness in the right upper quadrant.  Genitourinary:    Comments: Rectal exam performed with the nurse Khouri RN present as chaperone; no stool in the rectal vault Musculoskeletal:     Comments: Small puncture mark noted that is well-healed in the right distal forearm, proximal to the wrist joint, with no underlying fluctuance  Skin:    General: Skin is warm and dry.  Neurological:     General: No focal  deficit present.     Mental Status: She is alert. Mental status is at baseline.  Psychiatric:        Mood and Affect: Mood normal.        Behavior: Behavior normal.     ED Results / Procedures / Treatments   Labs (all labs ordered are listed, but only abnormal results are displayed) Labs Reviewed  COMPREHENSIVE METABOLIC PANEL - Abnormal; Notable for the following components:      Result Value   Glucose, Bld 135 (*)    Albumin 3.4 (*)    All other components within normal limits  CBC  LIPASE, BLOOD  POC OCCULT BLOOD, ED  I-STAT BETA HCG BLOOD, ED ([facility], [facility], [facility] ONLY)  TYPE AND SCREEN    EKG EKG Interpretation  Date/Time:  Friday September 24 2022 09:39:14 EDT Ventricular Rate:  73 PR Interval:  128 QRS Duration: 77 QT Interval:  403 QTC Calculation: 445 R Axis:   44 Text Interpretation: Sinus rhythm Confirmed by Alvester Chou [id number]) on 09/24/2022 9:56:22 AM  Radiology CT ABDOMEN PELVIS W CONTRAST  Result Date: 09/24/2022 CLINICAL DATA:  57 year old female with right lower quadrant abdominal pain and loose stools for 3 days. History of colon cancer status post right hemicolectomy in December of 2022. EXAM: CT ABDOMEN AND PELVIS WITH CONTRAST TECHNIQUE: Multidetector CT imaging of the abdomen and pelvis was performed using the standard protocol following bolus administration of intravenous contrast. RADIATION DOSE REDUCTION: This exam was performed according to the departmental dose-optimization program which includes automated exposure control, adjustment of the mA and/or kV according to patient size and/or use of iterative reconstruction technique. CONTRAST:  75mL OMNIPAQUE IOHEXOL 350 MG/ML SOLN COMPARISON:  Staging CT Chest, Abdomen, and Pelvis 04/16/2021. FINDINGS: Lower chest: Stable and negative. No pericardial or pleural effusion. No lung base nodule. Hepatobiliary: Liver enhancement is within normal limits. No discrete liver lesion. Negative gallbladder. No bile duct  enlargement. Pancreas: Negative. Spleen: Stable since 2022 and negative. Adrenals/Urinary Tract: Adrenal glands remain normal. Nonobstructed kidneys with symmetric renal enhancement and contrast excretion. Decompressed ureters. No nephrolithiasis. Diminutive, unremarkable bladder. Stomach/Bowel: Decompressed rectosigmoid colon, descending colon. Retained fluid in the transverse colon which is nondilated. Right abdominal small to large bowel anastomosis is fluid-filled and mildly distended. But the upstream small bowel loop is nondilated (coronal image 22). No convincing large bowel inflammation there. No free air or free fluid. No dilated small bowel. Stomach and duodenum appear negative. Postoperative changes to the ventral midline abdominal wall with no adverse features. Vascular/Lymphatic: Normal caliber abdominal aorta. Major arterial structures remain patent. No significant atherosclerosis. Prominent bilateral pelvic and inguinal lymph nodes are stable since 2022 arguing for an benignity, this includes an 8 mm short axis left pelvic sidewall  node on series 3, image 67. Similar small retroperitoneal nodes in the abdomen are stable. Reproductive: Chronically abnormal uterus, multiple fibroids suspected in Oct 25, 2020. Rounded fundal hypodense mass which is probably a benign fibroid has progressed from about 6.5 cm diameter at that time up to 7.3 cm now on sagittal image 68. Multiple other uterine masses with distorted lower uterine segment contour. a subserosal, pedunculated fibroid directed caudally from the uterine body on sagittal image 62 is chronic. Adnexa appear stable and within normal limits. Other: No pelvis free fluid. Musculoskeletal: No acute or suspicious osseous lesion identified. Mild for age degenerative changes. IMPRESSION: 1. Right hemicolectomy since 25-Oct-2020 CT. Fluid distended large bowel anastomosis and residual proximal colon. But no mass or discrete bowel inflammation. And upstream small bowel  anastomotic loop is non-dilated. Consider Acute Enteritis/Diarrhea. 2. Otherwise essentially stable CT Abdomen and Pelvis since Oct 25, 2020, including pronounced Fibroid Uterus. No metastatic disease identified. Electronically Signed   By: Odessa Fleming M.D.   On: 09/24/2022 14:34   US Abdomen Limited RUQ (LIVER/GB)  Result Date: 09/24/2022 CLINICAL DATA:  Abdominal pain.  History of colectomy. EXAM: ULTRASOUND ABDOMEN LIMITED RIGHT UPPER QUADRANT COMPARISON:  [facility] 05/30/2021, CT chest, abdomen, and pelvis 04/16/2021 FINDINGS: Gallbladder: No gallstones or wall thickening visualized. No sonographic Murphy sign noted by sonographer. Common bile duct: Diameter: 5 mm, within normal limits. No intrahepatic or extrahepatic biliary ductal dilatation. Liver: There is increased echogenicity and attenuation of the liver parenchyma diffusely, suggesting mild to moderate fatty infiltration within normal limits in parenchymal echogenicity. Portal vein is patent on color Doppler imaging with normal direction of blood flow towards the liver. Other: None. IMPRESSION: 1. No acute findings. 2. Mild to moderate fatty infiltration of the liver. Electronically Signed   By: Neita Garnet M.D.   On: 09/24/2022 11:00    Procedures Procedures    Medications Ordered in ED Medications  HYDROmorphone (DILAUDID) injection 0.5 mg (0.5 mg Intravenous Given 09/24/22 1003)  ondansetron (ZOFRAN) injection 4 mg (4 mg Intravenous Given 09/24/22 1003)  iohexol (OMNIPAQUE) 350 MG/ML injection 75 mL (75 mLs Intravenous Contrast Given 09/24/22 1421)  oxyCODONE-acetaminophen (PERCOCET/ROXICET) 5-325 MG per tablet 1 tablet (1 tablet Oral Given 09/24/22 1507)    ED Course/ Medical Decision Making/ A&P Clinical Course as of 09/24/22 1918  Fri Sep 24, 2022  1020 Hemoglobin: 13.6 [MT]    Clinical Course User Index [MT] Terald Sleeper, MD                             Medical Decision Making Amount and/or Complexity of Data Reviewed Labs: ordered.  Decision-making details documented in ED Course. Radiology: ordered.  Risk OTC drugs. Prescription drug management.   This patient presents to the ED with concern for abdominal pain, dark stools. This involves an extensive number of treatment options, and is a complaint that carries with it a high risk of complications and morbidity.  The differential diagnosis includes colitis versus antibiotic side effects versus acute biliary disease versus other  External records from outside source obtained and reviewed including colonoscopy performed 08/12/22 showing biopsies of ulceration site near her last anastomosis showing benign inflammatory changes, no features of Crohn disease or dysplasia according to Dr Lorin Picket Cunningham's note; 1 polyp removed that was completely benign  I ordered and personally interpreted labs.  The pertinent results include:  no emergent findings.  LFT's, lipase, WBC wnl.  I ordered imaging studies including RUQ ultrasound, CT abdomen  pelvis with contrast I independently visualized and interpreted imaging which showed no emergent findings; no evident signs of acute biliary disease I agree with the radiologist interpretation  The patient was maintained on a cardiac monitor.  I personally viewed and interpreted the cardiac monitored which showed an underlying rhythm of: NSR  Per my interpretation the patient's ECG shows no acute ischemic findings  I ordered medication including IV pain and nausea medication  I have reviewed the patients home medicines and have made adjustments as needed.  We will discontinue Augmentin as this was prophylactic dosage and there is no evidence of infection now 3 days post treatment for her dog bite.  I added this medication to a list of drug intolerances.  Test Considered: doubt ovarian torsion or PID: no indication for emergent pelvic doppler ultrasound  We discussed her fibroid findings, she is aware of these, but has not had issues with  them in the past, and therefore can continue watchful monitoring with her doctor on this issue.   After the interventions noted above, I reevaluated the patient and found that they have: improved   Dispostion:  After consideration of the diagnostic results and the patients response to treatment, I feel that the patent would benefit from close outpatient follow up.  Patient's abdominal pain improved but still having some soreness reported.  She is stable for discharge - I advised this may be medication side effect, and I would expect her symptoms to improve over the next 1-2 days as she stops augmentin.  If she is worsening she will need to return to the ED.   She is comfortable with this plan.  Her family members are also here for discharge discussion.         Final Clinical Impression(s) / ED Diagnoses Final diagnoses:  Abdominal pain, unspecified abdominal location    Rx / DC Orders ED Discharge Orders          Ordered    oxyCODONE-acetaminophen (PERCOCET/ROXICET) 5-325 MG tablet  Every 8 hours PRN        09/24/22 1504    senna-docusate (SENOKOT-S) 8.6-50 MG tablet  Daily PRN        09/24/22 1504    ondansetron (ZOFRAN-ODT) 4 MG disintegrating tablet  Every 8 hours PRN        09/24/22 1504              Churchill Grimsley, Kermit Balo, MD 09/24/22 1921

## 2022-09-25 LAB — TYPE AND SCREEN
ABO/RH(D): B POS
Antibody Screen: POSITIVE

## 2022-09-25 LAB — BPAM RBC
Blood Product Expiration Date: 202404262359
Unit Type and Rh: 7300

## 2022-09-30 ENCOUNTER — Encounter: Payer: Self-pay | Admitting: Internal Medicine

## 2022-09-30 ENCOUNTER — Ambulatory Visit: Payer: Commercial Managed Care - PPO | Attending: Internal Medicine | Admitting: Internal Medicine

## 2022-09-30 VITALS — BP 120/80 | HR 85 | Ht 62.0 in | Wt 178.4 lb

## 2022-09-30 DIAGNOSIS — R0602 Shortness of breath: Secondary | ICD-10-CM

## 2022-09-30 NOTE — Patient Instructions (Addendum)
Medication Instructions:  Your physician recommends that you continue on your current medications as directed. Please refer to the Current Medication list given to you today.  *If you need a refill on your cardiac medications before your next appointment, please call your pharmacy*   Testing/Procedures: Dr. Wyline Mood has ordered a Myocardial Perfusion Imaging Study.   The test will take approximately 3 to 4 hours to complete; you may bring reading material.  If someone comes with you to your appointment, they will need to remain in the main lobby due to limited space in the testing area. **If you are pregnant or breastfeeding, please notify the nuclear lab prior to your appointment**  You will need to hold the following medications prior to your stress test: beta-blockers (24 hours prior to test)   How to prepare for your Myocardial Perfusion Test: Do not eat or drink 3 hours prior to your test, except you may have water. Do not consume products containing caffeine (regular or decaffeinated) 12 hours prior to your test. (ex: coffee, chocolate, sodas, tea). Do wear comfortable clothes (no dresses or overalls) and walking shoes, tennis shoes preferred (No heels or open toe shoes are allowed). Do NOT wear cologne, perfume, aftershave, or lotions (deodorant is allowed). If these instructions are not followed, your test will have to be rescheduled. This procedure will be done at [address]    Follow-Up: At [facility], you and your health needs are our priority.  As part of our continuing mission to provide you with exceptional heart care, we have created designated Provider Care Teams.  These Care Teams include your primary Cardiologist (physician) and Advanced Practice Providers (APPs -  Physician Assistants and Nurse Practitioners) who all work together to provide you with the care you need, when you need it.  We recommend signing up for the patient portal called  "MyChart".  Sign up information is provided on this After Visit Summary.  MyChart is used to connect with patients for Virtual Visits (Telemedicine).  Patients are able to view lab/test results, encounter notes, upcoming appointments, etc.  Non-urgent messages can be sent to your provider as well.   To learn more about what you can do with MyChart, go to [email or web address].    Your next appointment:   We will see you on an as needed basis.   Provider:   Carolan Clines, MD

## 2022-09-30 NOTE — Progress Notes (Signed)
Cardiology Office Note:    Date:  09/30/2022   ID:  S[facility], DOB 1965/10/28, MRN [id number]  PCP:  Shelby Matar, Wyatt   [facility] Providers Cardiologist:  None     Referring [facility]: Shelby Matar, Wyatt   No chief complaint on file. DOE  History of Present Illness:    Shelby Wyatt is a 57 y.o. female with a hx of colon CA 2022, no chemo; R hemicolectomy , HTN, smoker, referral from Dr. Laural [facility] for SOB. She notes SOB with hill and stairs. This is new for her. No chest pressure.  This has been going on for about 3-4 months. This is concerning her. No prior stress test.  No wheezing. No syncope. No orthopnea/PND, no LE edema.   Mother has cardiac dx. Father passed. Sisters are healthy  She walks sometimes. She is perimenopausal.   She's been smoking since 1989. No success in quitting.   The 10-year ASCVD risk score (Arnett DK, et al., 2019) is: 6.9%   Values used to calculate the score:     Age: 32 years     Sex: Female     Is Non-Hispanic African American: Yes     Diabetic: No     Tobacco smoker: Yes     Systolic Blood Pressure: 120 mmHg     Is BP treated: Yes     HDL Cholesterol: 45 mg/dL     Total Cholesterol: 153 mg/dL   Past Medical History:  Diagnosis Date   Allergy    seasonal   Anemia    Cancer    colon, cancer dec 2022   Hypertension    No pertinent past medical history    Pre-diabetes    Tubal pregnancy     Past Surgical History:  Procedure Laterality Date   COLONOSCOPY     COLONOSCOPY WITH PROPOFOL N/A 04/07/2021   Procedure: COLONOSCOPY WITH PROPOFOL;  Surgeon: [facility] Lofty., [facility];  [facility];  Service: Gastroenterology;  Laterality: N/A;   ENDOSCOPIC MUCOSAL RESECTION N/A 04/07/2021   Procedure: ENDOSCOPIC MUCOSAL RESECTION;  Surgeon: Shelby Score Netty Starring., [facility]att;  Location: [facility];  Service: Gastroenterology;  Laterality: N/A;   HEMOSTASIS CLIP PLACEMENT  04/07/2021   Procedure: HEMOSTASIS  CLIP PLACEMENT;  Surgeon: Shelby Lofty., Wyatt;  [facility];  Service: Gastroenterology;;   LAPAROSCOPIC RIGHT HEMI COLECTOMY Right 05/27/2021   Procedure: LAPAROSCOPIC RIGHT HEMI COLECTOMY;  Surgeon: [facility];  Location: [facility];  Service: General;  Laterality: Right;   laporoscopy     [facility]     SUBMUCOSAL LIFTING INJECTION  04/07/2021   Procedure: SUBMUCOSAL LIFTING INJECTION;  Surgeon: Shelby Lofty., Wyatt;  Location: [facility];  Service: Gastroenterology;;   TUBAL LIGATION     WISDOM TOOTH EXTRACTION      Current Medications: Current Outpatient Medications on File Prior to Visit  Medication Sig Dispense Refill   amLODipine (NORVASC) 10 MG tablet Take 1 tablet (10 mg total) by Shelby daily. 30 tablet 3   gabapentin (NEURONTIN) 300 MG capsule Take 1 capsule (300 mg total) by Shelby at bedtime. 30 capsule 3   metFORMIN (GLUCOPHAGE) 500 MG tablet Take 1 tablet (500 mg total) by Shelby 2 (two) times daily with a meal. 180 tablet 1   miconazole (MICATIN) 2 % cream Apply 1 Application topically 2 (two) times daily. 28.35 g 0   Multiple Vitamins-Minerals (MULTIVITAMIN WITH MINERALS) tablet Take 1 tablet by Shelby daily. Centrum 50+  naproxen sodium (ALEVE) 220 MG tablet Take 2 tablets (440 mg total) by Shelby daily as needed (cramps). 30 tablet 0   nicotine (NICODERM CQ - DOSED IN MG/24 HOURS) 14 mg/24hr patch Place 1 patch (14 mg total) onto the skin daily. 28 patch 2   nicotine polacrilex (NICORETTE) 2 MG gum Take 1 each (2 mg total) by Shelby as needed for smoking cessation. 110 tablet 2   ondansetron (ZOFRAN-ODT) 4 MG disintegrating tablet Take 1 tablet (4 mg total) by Shelby every 8 (eight) hours as needed for nausea or vomiting for up to 12 doses 12 tablet 0   oxyCODONE-acetaminophen (PERCOCET/ROXICET) 5-325 MG tablet Take 1 tablet by Shelby every 8 (eight) hours as needed for severe pain. 4 tablet 0   senna-docusate (SENOKOT-S) 8.6-50 MG  tablet Take 1 tablet by Shelby daily as needed for mild constipation. 12 tablet 0   valsartan (DIOVAN) 40 MG tablet Take 1 tablet (40 mg total) by Shelby daily. 30 tablet 3   No current facility-administered medications on file prior to visit.     Allergies:   Augmentin [amoxicillin-pot clavulanate], Lisinopril, and Terbinafine and related   Social History   Socioeconomic History   Marital status: Married    Spouse name: Not on file   Number of children: 0   Years of education: Not on file   Highest education level: Associate degree: occupational, Scientist, product/process development, or vocational program  Occupational History   Occupation: Food services - [employer]  Tobacco Use   Smoking status: Every Day    Packs/day: .25    Types: Cigarettes   Smokeless tobacco: Never  Vaping Use   Vaping Use: Never used  Substance and Sexual Activity   Alcohol use: Yes    Comment: occasionally   Drug use: Yes    Types: Marijuana    Comment: Smoked at the beginning of month   Sexual activity: Yes    Birth control/protection: None  Other Topics Concern   Not on file  Social History Narrative   Not on file   Social Determinants of Health   Financial Resource Strain: Low Risk  (09/14/2022)   Overall Financial Resource Strain (CARDIA)    Difficulty of Paying Living Expenses: Not hard at all  Food Insecurity: No Food Insecurity (09/14/2022)   Hunger Vital Sign    Worried About Running Out of Food in the Last Year: Never true    Ran Out of Food in the Last Year: Never true  Transportation Needs: No Transportation Needs (09/14/2022)   PRAPARE - Administrator, Civil Service (Medical): No    Lack of Transportation (Non-Medical): No  Physical Activity: Insufficiently Active (09/14/2022)   Exercise Vital Sign    Days of Exercise per Week: 3 days    Minutes of Exercise per Session: 10 min  Stress: No Stress Concern Present (09/14/2022)   [employer] of Occupational Health - Occupational Stress Questionnaire     Feeling of Stress : Only a little  Social Connections: Moderately Integrated (09/14/2022)   Social Connection and Isolation Panel [NHANES]    Frequency of Communication with Friends and Family: More than three times a week    Frequency of Social Gatherings with Friends and Family: Three times a week    Attends Religious Services: 1 to 4 times per year    Active Member of Clubs or Organizations: No    Attends Banker Meetings: Not on file    Marital Status: Married  Family History: The patient's family history includes Breast cancer in her mother; Diabetes in her mother; Esophageal cancer in her paternal grandmother; Hypertension in her maternal grandmother, mother, paternal grandfather, and paternal grandmother. There is no history of Colon cancer, Rectal cancer, Stomach cancer, Inflammatory bowel disease, Liver disease, Pancreatic cancer, Colon polyps, Crohn's disease, or Ulcerative colitis.  ROS:   Please see the history of present illness.     All other systems reviewed and are negative.  EKGs/Labs/Other Studies Reviewed:    The following studies were reviewed today:   EKG:  EKG is  ordered today.  The ekg ordered today demonstrates   09/30/2022- NSR  Recent Labs: 09/24/2022: ALT 17; BUN 10; Creatinine, Ser 0.59; Hemoglobin 13.6; Platelets 245; Potassium 3.9; Sodium 138   Recent Lipid Panel    Component Value Date/Time   CHOL 153 05/24/2022 1629   TRIG 119 05/24/2022 1629   HDL 45 05/24/2022 1629   CHOLHDL 3.4 05/24/2022 1629   LDLCALC 87 05/24/2022 1629     Risk Assessment/Calculations:    Physical Exam:    VS:  Vitals:   09/30/22 1035  BP: 120/80  Pulse: 85  SpO2: 98%    Wt Readings from Last 3 Encounters:  09/24/22 178 lb (80.7 kg)  09/21/22 178 lb (80.7 kg)  09/17/22 181 lb (82.1 kg)     GEN:  Well nourished, well developed in no acute distress HEENT: Normal NECK: No JVD; No carotid bruits LYMPHATICS: No lymphadenopathy CARDIAC: RRR,  no murmurs, rubs, gallops RESPIRATORY:  Clear to auscultation without rales, wheezing or rhonchi  ABDOMEN: Soft, non-tender, non-distended MUSCULOSKELETAL:  No edema; No deformity  SKIN: Warm and dry NEUROLOGIC:  Alert and oriented x 3 PSYCHIATRIC:  Normal affect   ASSESSMENT:    SOB: possibly related to CAD. She is a smoker. Will stress her with exercise SPECT to assess for coronary dx. Can consider PFTs if SPECT is normal  HTN: well controlled. continue current regimen. PLAN:    In order of problems listed above:  Exercise SPECT Follow up pending results       Shared Decision Making/Informed Consent The risks [chest pain, shortness of breath, cardiac arrhythmias, dizziness, blood pressure fluctuations, myocardial infarction, stroke/transient ischemic attack, nausea, vomiting, allergic reaction, radiation exposure, metallic taste sensation and life-threatening complications (estimated to be 1 in 10,000)], benefits (risk stratification, diagnosing coronary artery disease, treatment guidance) and alternatives of a nuclear stress test were discussed in detail with [facility] and she agrees to proceed.   Medication Adjustments/Labs and Tests Ordered: Current medicines are reviewed at length with the patient today.  Concerns regarding medicines are outlined above.  No orders of the defined types were placed in this encounter.  No orders of the defined types were placed in this encounter.   There are no Patient Instructions on file for this visit.   Signed, Maisie Fus, [facility]att  09/30/2022 7:57 AM    [facility]

## 2022-10-07 ENCOUNTER — Ambulatory Visit: Payer: Commercial Managed Care - PPO | Admitting: Rehabilitative and Restorative Service Providers"

## 2022-10-07 ENCOUNTER — Encounter: Payer: Self-pay | Admitting: Rehabilitative and Restorative Service Providers"

## 2022-10-07 ENCOUNTER — Other Ambulatory Visit: Payer: Self-pay

## 2022-10-07 DIAGNOSIS — G8929 Other chronic pain: Secondary | ICD-10-CM

## 2022-10-07 DIAGNOSIS — R262 Difficulty in walking, not elsewhere classified: Secondary | ICD-10-CM | POA: Diagnosis not present

## 2022-10-07 DIAGNOSIS — M6281 Muscle weakness (generalized): Secondary | ICD-10-CM | POA: Diagnosis not present

## 2022-10-07 DIAGNOSIS — M25562 Pain in left knee: Secondary | ICD-10-CM

## 2022-10-07 DIAGNOSIS — M25561 Pain in right knee: Secondary | ICD-10-CM

## 2022-10-07 NOTE — Therapy (Signed)
OUTPATIENT PHYSICAL THERAPY  EVALUATION   Patient Name: [facility] MRN: [id number] DOB:11-Jun-1966, 57 y.o., female Today's Date: 10/07/2022  END OF SESSION:  PT End of Session - 10/07/22 1557     Visit Number 1    Number of Visits 20    Date for PT Re-Evaluation 12/16/22    Authorization Type [facility] [employer] $25 copay    Progress Note Due on Visit 10    PT Start Time 1557    PT Stop Time 1630    PT Time Calculation (min) 33 min    Activity Tolerance Patient tolerated treatment well    Behavior During Therapy [facility] for tasks assessed/performed             Past Medical History:  Diagnosis Date   Allergy    seasonal   Anemia    Cancer    colon, cancer dec 2022   Hypertension    No pertinent past medical history    Pre-diabetes    Tubal pregnancy    Past Surgical History:  Procedure Laterality Date   COLONOSCOPY     COLONOSCOPY WITH PROPOFOL N/A 04/07/2021   Procedure: COLONOSCOPY WITH PROPOFOL;  Surgeon: Lemar Lofty., MD;  Location: [facility];  Service: Gastroenterology;  Laterality: N/A;   ENDOSCOPIC MUCOSAL RESECTION N/A 04/07/2021   Procedure: ENDOSCOPIC MUCOSAL RESECTION;  Surgeon: Meridee Score Netty Starring., MD;  Location: [facility];  Service: Gastroenterology;  Laterality: N/A;   HEMOSTASIS CLIP PLACEMENT  04/07/2021   Procedure: HEMOSTASIS CLIP PLACEMENT;  Surgeon: Lemar Lofty., MD;  Location: [facility];  Service: Gastroenterology;;   LAPAROSCOPIC RIGHT HEMI COLECTOMY Right 05/27/2021   Procedure: LAPAROSCOPIC RIGHT HEMI COLECTOMY;  Surgeon: Andria Meuse, MD;  Location: [facility];  Service: General;  Laterality: Right;   laporoscopy     MOUTH SURGERY     SUBMUCOSAL LIFTING INJECTION  04/07/2021   Procedure: SUBMUCOSAL LIFTING INJECTION;  Surgeon: Lemar Lofty., MD;  Location: [facility];  Service: Gastroenterology;;   TUBAL LIGATION     WISDOM TOOTH EXTRACTION     Patient Active Problem List   Diagnosis Date  Noted   Numbness of fingers of both hands 10/12/2021   Glaucoma suspect 05/27/2021   Serrated polyp of colon 04/02/2021   Hx of adenomatous colonic polyps 04/02/2021   Cecal polyp 04/02/2021   Abnormal colonoscopy 04/02/2021   Cough due to ACE inhibitor 03/05/2021   Obesity (BMI 30-39.9) 03/05/2021   Essential hypertension 12/25/2020   Prediabetes 12/25/2020   Perimenopausal 12/25/2020   Tobacco dependence 12/01/2020   Incontinence of feces 12/01/2020   Abdominal pain 12/01/2020    PCP: Marcine Matar MD  REFERRING PROVIDER: Cristie Hem, PA-C  REFERRING DIAG: [phone] (ICD-10-CM) - Unilateral primary osteoarthritis, left knee  THERAPY DIAG:  Chronic pain of left knee  Chronic pain of right knee  Muscle weakness (generalized)  Difficulty in walking, not elsewhere classified  Rationale for Evaluation and Treatment: Rehabilitation  ONSET DATE: worsened in last 6 months  SUBJECTIVE:   SUBJECTIVE STATEMENT: Pt indicated having complaints of knee pain. She indicated recent worsening of symptoms of knees with pain on stairs and giving way feel.  She also reported some difficulty with getting legs straighten after being bent while sleeping.    Referred for physical therapy   PERTINENT HISTORY: History of colon cancer, HTN, pre-diabetes.  History of Lt knee arthritis.  She indicated banging Lt knee on door in 2017 with severe pain and had physical therapy then  for symptoms with improvements for a period of time.    PAIN:  NPRS scale: at rest 0/10, at worst 9/10 (more on Lt) Pain location: Lt knee/Rt knee Pain description: giving way Aggravating factors: stairs, squatting, movement after inactivity Relieving factors: nothing specific   PRECAUTIONS: None  WEIGHT BEARING RESTRICTIONS: No  FALLS:  Has patient fallen in last 6 months? No  LIVING ENVIRONMENT: Lives in: House/apartment Stairs: flight of stairs Rt side hand rail going up   OCCUPATION: Work as  Probation officer  PLOF: Independent, paint glasses, wreath making, playing with dog.   PATIENT GOALS: Reduce pain, stay better.   OBJECTIVE:   PATIENT SURVEYS:  10/07/2022 FOTO intake: 61   predicted:  67  COGNITION: 10/07/2022 Overall cognitive status: WFL    SENSATION: 10/07/2022 WFL  MUSCLE LENGTH: 10/07/2022 None performed  POSTURE:  10/07/2022 No Significant postural limitations  PALPATION: 10/07/2022 No specific trouble to light touch  LOWER EXTREMITY MMT:   MMT Right 10/07/2022 Left 10/07/2022  Hip flexion 5/5 5/5  Hip extension    Hip abduction    Hip adduction    Hip internal rotation    Hip external rotation    Knee flexion 5/5 5/5  Knee extension 5/5  41.1, 37.8 lbs 5/5  46.3, 42 lbs  Ankle dorsiflexion 5/5 5/5  Ankle plantarflexion    Ankle inversion    Ankle eversion     (Blank rows = not tested)  LOWER EXTREMITY ROM:  ROM Right 10/07/2022 Left 10/07/2022  Hip flexion    Hip extension    Hip abduction    Hip adduction    Hip internal rotation    Hip external rotation    Knee flexion 125 AROM in supine heel slide 120 AROM in supine heel slide  Knee extension 0 0  Ankle dorsiflexion    Ankle plantarflexion    Ankle inversion    Ankle eversion     (Blank rows = not tested)  LOWER EXTREMITY SPECIAL TESTS:  10/07/2022 No specific testing noted today  FUNCTIONAL TESTS:  10/07/2022 18 inch chair transfer: s UE assist with pain noted (both knees), pain in lowering as well Lt SLS: 20 seconds Rt SLS: 15 seconds  GAIT: 10/07/2022 Independent ambulation   TODAY'S TREATMENT                                                                          DATE:10/07/2022 Therex:    HEP instruction/performance c cues for techniques, handout provided.  Trial set performed of each for comprehension and symptom assessment.  See below for exercise list  PATIENT EDUCATION:  Education details: HEP, POC Person educated: Patient Education method:  Explanation, Demonstration, Verbal cues, and Handouts Education comprehension: verbalized understanding, returned demonstration, and verbal cues required  HOME EXERCISE PROGRAM: Access Code: URL: [email or web address] Date: 10/07/2022 Prepared by: Chyrel Masson  Exercises - Supine Heel Slide  - 3-5 x daily - 7 x weekly - 1-2 sets - 10 reps - 2 hold - Seated Long Arc Quad  - 3-5 x daily - 7 x weekly - 1-2 sets - 10 reps - 2 hold - Supine Bridge  - 1-2 x daily - 7 x weekly - 1-2 sets -  10 reps - 2 hold - Seated Quad Set (Mirrored)  - 3-5 x daily - 7 x weekly - 1 sets - 10 reps - 5 hold - Seated Straight Leg Heel Taps  - 1-2 x daily - 7 x weekly - 3 sets - 10 reps - Sit to Stand  - 3 x daily - 7 x weekly - 1 sets - 10 reps  ASSESSMENT:  CLINICAL IMPRESSION: Patient is a 57 y.o. who comes to clinic with complaints of Lt knee pain with mobility, strength and movement coordination deficits that impair their ability to perform usual daily and recreational functional activities without increase difficulty/symptoms at this time.  Patient to benefit from skilled PT services to address impairments and limitations to improve to previous level of function without restriction secondary to condition.   OBJECTIVE IMPAIRMENTS: decreased activity tolerance, decreased balance, decreased coordination, decreased endurance, decreased mobility, difficulty walking, decreased ROM, decreased strength, impaired perceived functional ability, impaired flexibility, improper body mechanics, and pain.   ACTIVITY LIMITATIONS: carrying, lifting, bending, sitting, standing, squatting, stairs, transfers, and locomotion level  PARTICIPATION LIMITATIONS: meal prep, cleaning, laundry, interpersonal relationship, driving, shopping, community activity, and occupation  PERSONAL FACTORS:  History of colon cancer, HTN, pre-diabetes  are also affecting patient's functional outcome.   REHAB POTENTIAL:  Good  CLINICAL DECISION MAKING: Stable/uncomplicated  EVALUATION COMPLEXITY: Low   GOALS: Goals reviewed with patient? Yes  SHORT TERM GOALS: (target date for Short term goals are 3 weeks 10/28/2022)   1.  Patient will demonstrate independent use of home exercise program to maintain progress from in clinic treatments.  Goal status: New  LONG TERM GOALS: (target dates for all long term goals are 10 weeks  12/16/2022 )   1. Patient will demonstrate/report pain at worst less than or equal to 2/10 to facilitate minimal limitation in daily activity secondary to pain symptoms.  Goal status: New   2. Patient will demonstrate independent use of home exercise program to facilitate ability to maintain/progress functional gains from skilled physical therapy services.  Goal status: New   3. Patient will demonstrate FOTO outcome > or = 67 % to indicate reduced disability due to condition.  Goal status: New   4.  Patient will demonstrate Lt LE MMT 5/5 throughout to faciltiate usual transfers, stairs, squatting at [facility] for daily life.   Goal status: New   5.  Patient will demonstrate knee AROM 0-120 degrees without symptoms for normal activity.  Goal status: New   6.  Patient will demonstrate/report ability to ascend/descend stairs with single rail assist c reciprocal gait pattern for household and work navigation.  Goal status: New     PLAN:  PT FREQUENCY: 1-2x/week  PT DURATION: 10 weeks  PLANNED INTERVENTIONS: Therapeutic exercises, Therapeutic activity, Neuro Muscular re-education, Balance training, Gait training, Patient/Family education, Joint mobilization, Stair training, DME instructions, Dry Needling, Electrical stimulation, Traction, Cryotherapy, vasopneumatic deviceMoist heat, Taping, Ultrasound, Ionotophoresis /ml Dexamethasone, and aquatic therapy, Manual therapy.  All included unless contraindicated  PLAN FOR NEXT SESSION: Review HEP knowledge/results.   Progressive  quad strengthening (avoiding pain increase in WB).     Chyrel Masson, PT, DPT, OCS, ATC 10/07/22  4:33 PM

## 2022-10-12 ENCOUNTER — Telehealth (HOSPITAL_COMMUNITY): Payer: Self-pay

## 2022-10-12 NOTE — Telephone Encounter (Signed)
Detailed instructions left on the patient's answering machine. Asked to call back with any questions. S.Elliott Lasecki EMTP/CCT 

## 2022-10-19 ENCOUNTER — Ambulatory Visit (HOSPITAL_COMMUNITY): Payer: Commercial Managed Care - PPO | Attending: Cardiology

## 2022-10-19 DIAGNOSIS — R0602 Shortness of breath: Secondary | ICD-10-CM | POA: Insufficient documentation

## 2022-10-19 LAB — MYOCARDIAL PERFUSION IMAGING
Base ST Depression (mm): 0 mm
Estimated workload: 7
Exercise duration (min): 7 min
Exercise duration (sec): 1 s
LV dias vol: 65 mL (ref 46–106)
LV sys vol: 23 mL
MPHR: 166 {beats}/min
Nuc Stress EF: 65 %
Peak HR: 151 {beats}/min
Percent HR: 90 %
Rest HR: 61 {beats}/min
Rest Nuclear Isotope Dose: 10.1 mCi
SDS: 3
SRS: 0
SSS: 3
ST Depression (mm): 1.5 mm
Stress Nuclear Isotope Dose: 31.6 mCi
TID: 0.92

## 2022-10-19 MED ORDER — TECHNETIUM TC 99M TETROFOSMIN IV KIT
10.1000 | PACK | Freq: Once | INTRAVENOUS | Status: AC | PRN
  Administered 2022-10-19: 10.1 via INTRAVENOUS

## 2022-10-19 MED ORDER — TECHNETIUM TC 99M TETROFOSMIN IV KIT
31.6000 | PACK | Freq: Once | INTRAVENOUS | Status: AC | PRN
  Administered 2022-10-19: 31.6 via INTRAVENOUS

## 2022-10-20 ENCOUNTER — Ambulatory Visit: Payer: Commercial Managed Care - PPO | Attending: Physician Assistant | Admitting: Physician Assistant

## 2022-10-20 ENCOUNTER — Encounter: Payer: Self-pay | Admitting: Physician Assistant

## 2022-10-20 VITALS — BP 123/71 | HR 84 | Wt 181.6 lb

## 2022-10-20 DIAGNOSIS — Z09 Encounter for follow-up examination after completed treatment for conditions other than malignant neoplasm: Secondary | ICD-10-CM

## 2022-10-20 DIAGNOSIS — Z7984 Long term (current) use of oral hypoglycemic drugs: Secondary | ICD-10-CM | POA: Diagnosis not present

## 2022-10-20 DIAGNOSIS — R7303 Prediabetes: Secondary | ICD-10-CM

## 2022-10-20 DIAGNOSIS — S61551A Open bite of right wrist, initial encounter: Secondary | ICD-10-CM

## 2022-10-20 LAB — GLUCOSE, POCT (MANUAL RESULT ENTRY): POC Glucose: 95 mg/dl (ref 70–99)

## 2022-10-20 LAB — POCT GLYCOSYLATED HEMOGLOBIN (HGB A1C): HbA1c, POC (controlled diabetic range): 6.1 % (ref 0.0–7.0)

## 2022-10-20 NOTE — Progress Notes (Signed)
[name], female   DOB: 1965-09-10, 57 y.o.   MRN: [id number]       Shelby Wyatt, is a 57 y.o. female  MWN:[id number]  QIH:[id number]  DOB - May 29, 1966  Chief Complaint  Patient presents with   Hospitalization Follow-up       Subjective:   Shelby Wyatt is a 57 y.o. female here today for a follow up visit  After ED visit 09/24/2022 following a dog bite and stomach upset from antibiotics.  Tdap was in 2022.  She is completely better.  Bite on dorsum of wrist completely healed and she has full ROM and function of R wrist and hand.  No further stomach upset.    From ED note: Shelby Wyatt is a 57 y.o. female present emergency department complaint of abdominal pain and dark stool.  Patient reports that she was bit by her dog about a week ago, near the right wrist.  She was prescribed Augmentin which she began taking 4 days ago on Monday.  She said around the same time she started she began having cramping pain in her abdomen and noted dark stools.  She is now having persistent and more severe pain particularly in the right upper quadrant of her abdomen, with nausea, no vomiting.  She reports a history of a right-sided hemicolectomy from colon cancer 2 years ago, but no other abdominal surgeries.    This patient presents to the ED with concern for abdominal pain, dark stools. This involves an extensive number of treatment options, and is a complaint that carries with it a high risk of complications and morbidity.  The differential diagnosis includes colitis versus antibiotic side effects versus acute biliary disease versus other   External records from outside source obtained and reviewed including colonoscopy performed 08/12/22 showing biopsies of ulceration site near her last anastomosis showing benign inflammatory changes, no features of Crohn disease or dysplasia according to Dr Shelby Wyatt's note; 1 polyp removed that was completely benign   I  ordered and personally interpreted labs.  The pertinent results include:  no emergent findings.  LFT's, lipase, WBC wnl.   I ordered imaging studies including RUQ ultrasound, CT abdomen pelvis with contrast I independently visualized and interpreted imaging which showed no emergent findings; no evident signs of acute biliary disease I agree with the radiologist interpretation   The patient was maintained on a cardiac monitor.  I personally viewed and interpreted the cardiac monitored which showed an underlying rhythm of: NSR   I have reviewed the patients home medicines and have made adjustments as needed.  We will discontinue Augmentin as this was prophylactic dosage and there is no evidence of infection now 3 days post treatment for her dog bite.  I added this medication to a list of drug intolerances.   Test Considered: doubt ovarian torsion or PID: no indication for emergent pelvic doppler ultrasound   We discussed her fibroid findings, she is aware of these, but has not had issues with them in the past, and therefore can continue watchful monitoring with her doctor on this issue.     After the interventions noted above, I reevaluated the patient and found that they have: improved     Dispostion:   After consideration of the diagnostic results and the patients response to treatment, I feel that the patent would benefit from close outpatient follow up.   Patient's abdominal pain improved but still having some soreness reported.  She is stable for discharge -  I advised this may be medication side effect, and I would expect her symptoms to improve over the next 1-2 days as she stops augmentin.  If she is worsening she will need to return to the ED.   She is comfortable with this plan.  Her family members are also here for discharge discussion. No problems updated.  ALLERGIES: Allergies  Allergen Reactions   Augmentin [Amoxicillin-Pot Clavulanate] Other (See Comments)    Significant  abdominal pain, diarrhea, following Augmentin specifically (not penicillin allergy)   Lisinopril Cough   Terbinafine And Related Hives    PAST MEDICAL HISTORY: Past Medical History:  Diagnosis Date   Allergy    seasonal   Anemia    Cancer (HCC)    colon, cancer dec 2022   Hypertension    No pertinent past medical history    Pre-diabetes    Tubal pregnancy     MEDICATIONS AT HOME: Prior to Admission medications   Medication Sig Start Date End Date Taking? Authorizing Provider  amLODipine (NORVASC) 10 MG tablet Take 1 tablet (10 mg total) by mouth daily. 09/17/22  Yes Shelby Matar, MD  gabapentin (NEURONTIN) 300 MG capsule Take 1 capsule (300 mg total) by mouth at bedtime. 05/18/22  Yes Shelby Matar, MD  metFORMIN (GLUCOPHAGE) 500 MG tablet Take 1 tablet (500 mg total) by mouth 2 (two) times daily with a meal. 09/21/22  Yes Shelby Matar, MD  miconazole (MICATIN) 2 % cream Apply 1 Application topically 2 (two) times daily. 09/17/22  Yes Shelby Matar, MD  Multiple Vitamins-Minerals (MULTIVITAMIN WITH MINERALS) tablet Take 1 tablet by mouth daily. Centrum 50+   Yes [provider]  naproxen sodium (ALEVE) 220 MG tablet Take 2 tablets (440 mg total) by mouth daily as needed (cramps). 04/21/21  Yes Shelby Wyatt., MD  nicotine (NICODERM CQ - DOSED IN MG/24 HOURS) 14 mg/24hr patch Place 1 patch (14 mg total) onto the skin daily. 04/02/21  Yes Shelby Matar, MD  nicotine polacrilex (NICORETTE) 2 MG gum Take 1 each (2 mg total) by mouth as needed for smoking cessation. 01/12/22  Yes Shelby Matar, MD  oxyCODONE-acetaminophen (PERCOCET/ROXICET) 5-325 MG tablet Take 1 tablet by mouth every 8 (eight) hours as needed for severe pain. 09/24/22  Yes Shelby Sleeper, MD  valsartan (DIOVAN) 40 MG tablet Take 1 tablet (40 mg total) by mouth daily. 09/17/22  Yes Shelby Matar, MD  ondansetron (ZOFRAN-ODT) 4 MG disintegrating tablet Take 1 tablet (4 mg total)  by mouth every 8 (eight) hours as needed for nausea or vomiting for up to 12 doses Patient not taking: Reported on 10/20/2022 09/24/22   Shelby Sleeper, MD  senna-docusate (SENOKOT-S) 8.6-50 MG tablet Take 1 tablet by mouth daily as needed for mild constipation. Patient not taking: Reported on 10/20/2022 09/24/22   Shelby Sleeper, MD    ROS: Neg HEENT Neg resp Neg cardiac Neg GI Neg GU Neg MS Neg psych Neg neuro  Objective:   Vitals:   10/20/22 1626  BP: 123/71  Pulse: 84  SpO2: 96%  Weight: 181 lb 9.6 oz (82.4 kg)   Exam General appearance : Awake, alert, not in any distress. Speech Clear. Not toxic looking HEENT: Atraumatic and NormocephalicNeck: Supple, no JVD. No cervical lymphadenopathy.  Chest: Good air entry bilaterally, CTAB.  No rales/rhonchi/wheezing CVS: S1 S2 regular, no murmurs.  R wrist and hand-dorsum of wrist 8mm scar from puncture wound.  Closed and well healed.  Full S&ROM R wrist and hand Extremities: B/L Lower Ext shows no edema, both legs are warm to touch Neurology: Awake alert, and oriented X 3, CN II-XII intact, Non focal Skin: No Rash  Data Review Lab Results  Component Value Date   HGBA1C 6.1 10/20/2022   HGBA1C 6.4 05/18/2022   HGBA1C 6.4 (A) 10/12/2021    Assessment & Plan   1. Prediabetes Improved!  Continue metformin - Glucose (CBG) - HgB A1c  2. Encounter for examination following treatment at hospital Doing well  3. Animal bite of right wrist Resolved.  Tdap UTD    Return for august appt with PCP.  The patient was given clear instructions to go to ER or return to medical center if symptoms don't improve, worsen or new problems develop. The patient verbalized understanding. The patient was told to call to get lab results if they haven't heard anything in the next week.      Georgian Co, PA-C [facility] [place], [place] [phone]   10/20/2022, 4:35 PM

## 2022-10-25 ENCOUNTER — Ambulatory Visit: Payer: Commercial Managed Care - PPO | Admitting: Rehabilitative and Restorative Service Providers"

## 2022-10-25 ENCOUNTER — Encounter: Payer: Self-pay | Admitting: Rehabilitative and Restorative Service Providers"

## 2022-10-25 DIAGNOSIS — R262 Difficulty in walking, not elsewhere classified: Secondary | ICD-10-CM | POA: Diagnosis not present

## 2022-10-25 DIAGNOSIS — G8929 Other chronic pain: Secondary | ICD-10-CM | POA: Diagnosis not present

## 2022-10-25 DIAGNOSIS — M25561 Pain in right knee: Secondary | ICD-10-CM | POA: Diagnosis not present

## 2022-10-25 DIAGNOSIS — M25562 Pain in left knee: Secondary | ICD-10-CM | POA: Diagnosis not present

## 2022-10-25 DIAGNOSIS — M6281 Muscle weakness (generalized): Secondary | ICD-10-CM | POA: Diagnosis not present

## 2022-10-25 NOTE — Therapy (Signed)
OUTPATIENT PHYSICAL THERAPY  TREATMENT   Patient Name: Shelby Wyatt MRN: [id number] DOB:1966-02-03, 57 y.o., female Today's Date: 10/25/2022  END OF SESSION:  PT End of Session - 10/25/22 1604     Visit Number 2    Number of Visits 20    Date for PT Re-Evaluation 12/16/22    Authorization Type [facility] [employer] $25 copay    Progress Note Due on Visit 10    PT Start Time 1601    PT Stop Time 1639    PT Time Calculation (min) 38 min    Activity Tolerance Patient tolerated treatment well    Behavior During Therapy [facility] for tasks assessed/performed              Past Medical History:  Diagnosis Date   Allergy    seasonal   Anemia    Cancer (HCC)    colon, cancer dec 2022   Hypertension    No pertinent past medical history    Pre-diabetes    Tubal pregnancy    Past Surgical History:  Procedure Laterality Date   COLONOSCOPY     COLONOSCOPY WITH PROPOFOL N/A 04/07/2021   Procedure: COLONOSCOPY WITH PROPOFOL;  Surgeon: Lemar Lofty., MD;  Location: [facility];  Service: Gastroenterology;  Laterality: N/A;   ENDOSCOPIC MUCOSAL RESECTION N/A 04/07/2021   Procedure: ENDOSCOPIC MUCOSAL RESECTION;  Surgeon: Meridee Score Netty Starring., MD;  Location: [facility];  Service: Gastroenterology;  Laterality: N/A;   HEMOSTASIS CLIP PLACEMENT  04/07/2021   Procedure: HEMOSTASIS CLIP PLACEMENT;  Surgeon: Lemar Lofty., MD;  Location: [facility];  Service: Gastroenterology;;   LAPAROSCOPIC RIGHT HEMI COLECTOMY Right 05/27/2021   Procedure: LAPAROSCOPIC RIGHT HEMI COLECTOMY;  Surgeon: Andria Meuse, MD;  Location: [facility];  Service: General;  Laterality: Right;   laporoscopy     MOUTH SURGERY     SUBMUCOSAL LIFTING INJECTION  04/07/2021   Procedure: SUBMUCOSAL LIFTING INJECTION;  Surgeon: Lemar Lofty., MD;  Location: [facility];  Service: Gastroenterology;;   TUBAL LIGATION     WISDOM TOOTH EXTRACTION     Patient Active Problem List    Diagnosis Date Noted   Numbness of fingers of both hands 10/12/2021   Glaucoma suspect 05/27/2021   Serrated polyp of colon 04/02/2021   Hx of adenomatous colonic polyps 04/02/2021   Cecal polyp 04/02/2021   Abnormal colonoscopy 04/02/2021   Cough due to ACE inhibitor 03/05/2021   Obesity (BMI 30-39.9) 03/05/2021   Essential hypertension 12/25/2020   Prediabetes 12/25/2020   Perimenopausal 12/25/2020   Tobacco dependence 12/01/2020   Incontinence of feces 12/01/2020   Abdominal pain 12/01/2020    PCP: Marcine Matar MD  REFERRING PROVIDER: Cristie Hem, PA-C  REFERRING DIAG: [phone] (ICD-10-CM) - Unilateral primary osteoarthritis, left knee  THERAPY DIAG:  Chronic pain of left knee  Chronic pain of right knee  Muscle weakness (generalized)  Difficulty in walking, not elsewhere classified  Rationale for Evaluation and Treatment: Rehabilitation  ONSET DATE: worsened in last 6 months  SUBJECTIVE:   SUBJECTIVE STATEMENT: She indicated having complaints during stress test and with stairs but thought she was getting better.   PERTINENT HISTORY: History of colon cancer, HTN, pre-diabetes.  History of Lt knee arthritis.  She indicated banging Lt knee on door in 2017 with severe pain and had physical therapy then for symptoms with improvements for a period of time.    PAIN:  NPRS scale: at worst in last week: 6/10 Pain location: Lt knee/Rt knee  Pain description: giving way Aggravating factors: stairs, squatting, movement after inactivity Relieving factors: nothing specific   PRECAUTIONS: None  WEIGHT BEARING RESTRICTIONS: No  FALLS:  Has patient fallen in last 6 months? No  LIVING ENVIRONMENT: Lives in: House/apartment Stairs: flight of stairs Rt side hand rail going up   OCCUPATION: Work as Probation officer  PLOF: Independent, paint glasses, wreath making, playing with dog.   PATIENT GOALS: Reduce pain, stay better.   OBJECTIVE:   PATIENT  SURVEYS:  10/07/2022 FOTO intake: 61   predicted:  67  COGNITION: 10/07/2022 Overall cognitive status: WFL    SENSATION: 10/07/2022 WFL  MUSCLE LENGTH: 10/07/2022 None performed  POSTURE:  10/07/2022 No Significant postural limitations  PALPATION: 10/07/2022 No specific trouble to light touch  LOWER EXTREMITY MMT:   MMT Right 10/07/2022 Left 10/07/2022  Hip flexion 5/5 5/5  Hip extension    Hip abduction    Hip adduction    Hip internal rotation    Hip external rotation    Knee flexion 5/5 5/5  Knee extension 5/5  41.1, 37.8 lbs 5/5  46.3, 42 lbs  Ankle dorsiflexion 5/5 5/5  Ankle plantarflexion    Ankle inversion    Ankle eversion     (Blank rows = not tested)  LOWER EXTREMITY ROM:  ROM Right 10/07/2022 Left 10/07/2022  Hip flexion    Hip extension    Hip abduction    Hip adduction    Hip internal rotation    Hip external rotation    Knee flexion 125 AROM in supine heel slide 120 AROM in supine heel slide  Knee extension 0 0  Ankle dorsiflexion    Ankle plantarflexion    Ankle inversion    Ankle eversion     (Blank rows = not tested)  LOWER EXTREMITY SPECIAL TESTS:  10/07/2022 No specific testing noted today  FUNCTIONAL TESTS:  10/25/2022:  5 x sit to stand 18 inch chair s UE assist:  20.63 seconds   10/07/2022 18 inch chair transfer: s UE assist with pain noted (both knees), pain in lowering as well Lt SLS: 20 seconds Rt SLS: 15 seconds  GAIT: 10/07/2022 Independent ambulation   TODAY'S TREATMENT                                                                          DATE: 10/25/2022 Therex: Nustep Lvl 5 11 mins UE/LE Leg press Double leg 75 lbs x 15, single leg 2 x 10 31 lbs bilaterally Seated quad set c SLR 2 x 10 bilateral Sit to stand to sit 18 inch chair s UE assist x5  Verbal review of existing HEP techniques.  Additional time spent in review.    TODAY'S TREATMENT                                                                           DATE:10/07/2022 Therex:    HEP instruction/performance c cues for techniques, handout  provided.  Trial set performed of each for comprehension and symptom assessment.  See below for exercise list  PATIENT EDUCATION:  Education details: HEP, POC Person educated: Patient Education method: Explanation, Demonstration, Verbal cues, and Handouts Education comprehension: verbalized understanding, returned demonstration, and verbal cues required  HOME EXERCISE PROGRAM: Access Code: URL: [email or web address] Date: 10/07/2022 Prepared by: Chyrel Masson  Exercises - Supine Heel Slide  - 3-5 x daily - 7 x weekly - 1-2 sets - 10 reps - 2 hold - Seated Long Arc Quad  - 3-5 x daily - 7 x weekly - 1-2 sets - 10 reps - 2 hold - Supine Bridge  - 1-2 x daily - 7 x weekly - 1-2 sets - 10 reps - 2 hold - Seated Quad Set (Mirrored)  - 3-5 x daily - 7 x weekly - 1 sets - 10 reps - 5 hold - Seated Straight Leg Heel Taps  - 1-2 x daily - 7 x weekly - 3 sets - 10 reps - Sit to Stand  - 3 x daily - 7 x weekly - 1 sets - 10 reps  ASSESSMENT:  CLINICAL IMPRESSION: Good recall of existing HEP.  Progressive strengthening to continue to help improve functional movement pattern and capacity.  Continued skilled PT services indicated at this time.    OBJECTIVE IMPAIRMENTS: decreased activity tolerance, decreased balance, decreased coordination, decreased endurance, decreased mobility, difficulty walking, decreased ROM, decreased strength, impaired perceived functional ability, impaired flexibility, improper body mechanics, and pain.   ACTIVITY LIMITATIONS: carrying, lifting, bending, sitting, standing, squatting, stairs, transfers, and locomotion level  PARTICIPATION LIMITATIONS: meal prep, cleaning, laundry, interpersonal relationship, driving, shopping, community activity, and occupation  PERSONAL FACTORS:  History of colon cancer, HTN, pre-diabetes  are also affecting patient's  functional outcome.   REHAB POTENTIAL: Good  CLINICAL DECISION MAKING: Stable/uncomplicated  EVALUATION COMPLEXITY: Low   GOALS: Goals reviewed with patient? Yes  SHORT TERM GOALS: (target date for Short term goals are 3 weeks 10/28/2022)   1.  Patient will demonstrate independent use of home exercise program to maintain progress from in clinic treatments.  Goal status: on going 10/25/2022  LONG TERM GOALS: (target dates for all long term goals are 10 weeks  12/16/2022 )   1. Patient will demonstrate/report pain at worst less than or equal to 2/10 to facilitate minimal limitation in daily activity secondary to pain symptoms.  Goal status: New   2. Patient will demonstrate independent use of home exercise program to facilitate ability to maintain/progress functional gains from skilled physical therapy services.  Goal status: New   3. Patient will demonstrate FOTO outcome > or = 67 % to indicate reduced disability due to condition.  Goal status: New   4.  Patient will demonstrate Lt LE MMT 5/5 throughout to faciltiate usual transfers, stairs, squatting at [facility] for daily life.   Goal status: New   5.  Patient will demonstrate knee AROM 0-120 degrees without symptoms for normal activity.  Goal status: New   6.  Patient will demonstrate/report ability to ascend/descend stairs with single rail assist c reciprocal gait pattern for household and work navigation.  Goal status: New     PLAN:  PT FREQUENCY: 1-2x/week  PT DURATION: 10 weeks  PLANNED INTERVENTIONS: Therapeutic exercises, Therapeutic activity, Neuro Muscular re-education, Balance training, Gait training, Patient/Family education, Joint mobilization, Stair training, DME instructions, Dry Needling, Electrical stimulation, Traction, Cryotherapy, vasopneumatic deviceMoist heat, Taping, Ultrasound, Ionotophoresis 4mg /ml Dexamethasone, and aquatic therapy, Manual therapy.  All  included unless contraindicated  PLAN FOR  NEXT SESSION: Strengthening as tolerated.  Low level step down trials laterally.   Chyrel Masson, PT, DPT, OCS, ATC 10/25/22  4:38 PM

## 2022-10-27 ENCOUNTER — Ambulatory Visit: Payer: Commercial Managed Care - PPO | Admitting: Rehabilitative and Restorative Service Providers"

## 2022-10-27 ENCOUNTER — Encounter: Payer: Self-pay | Admitting: Rehabilitative and Restorative Service Providers"

## 2022-10-27 DIAGNOSIS — R262 Difficulty in walking, not elsewhere classified: Secondary | ICD-10-CM | POA: Diagnosis not present

## 2022-10-27 DIAGNOSIS — M6281 Muscle weakness (generalized): Secondary | ICD-10-CM

## 2022-10-27 DIAGNOSIS — G8929 Other chronic pain: Secondary | ICD-10-CM | POA: Diagnosis not present

## 2022-10-27 DIAGNOSIS — M25561 Pain in right knee: Secondary | ICD-10-CM | POA: Diagnosis not present

## 2022-10-27 DIAGNOSIS — M25562 Pain in left knee: Secondary | ICD-10-CM | POA: Diagnosis not present

## 2022-10-27 NOTE — Therapy (Signed)
OUTPATIENT PHYSICAL THERAPY  TREATMENT   Patient Name: Shelby Wyatt MRN: [id number] DOB:Nov 04, 1965, 57 y.o., female Today's Date: 10/27/2022  END OF SESSION:  PT End of Session - 10/27/22 1603     Visit Number 3    Number of Visits 20    Date for PT Re-Evaluation 12/16/22    Authorization Type [facility] [employer] $25 copay    Progress Note Due on Visit 10    PT Start Time 1602    PT Stop Time 1643    PT Time Calculation (min) 41 min    Activity Tolerance Patient tolerated treatment well;No increased pain    Behavior During Therapy [facility] for tasks assessed/performed             Past Medical History:  Diagnosis Date   Allergy    seasonal   Anemia    Cancer (HCC)    colon, cancer dec 2022   Hypertension    No pertinent past medical history    Pre-diabetes    Tubal pregnancy    Past Surgical History:  Procedure Laterality Date   COLONOSCOPY     COLONOSCOPY WITH PROPOFOL N/A 04/07/2021   Procedure: COLONOSCOPY WITH PROPOFOL;  Surgeon: Lemar Lofty., MD;  Location: [facility];  Service: Gastroenterology;  Laterality: N/A;   ENDOSCOPIC MUCOSAL RESECTION N/A 04/07/2021   Procedure: ENDOSCOPIC MUCOSAL RESECTION;  Surgeon: Meridee Score Netty Starring., MD;  Location: [facility];  Service: Gastroenterology;  Laterality: N/A;   HEMOSTASIS CLIP PLACEMENT  04/07/2021   Procedure: HEMOSTASIS CLIP PLACEMENT;  Surgeon: Lemar Lofty., MD;  Location: [facility];  Service: Gastroenterology;;   LAPAROSCOPIC RIGHT HEMI COLECTOMY Right 05/27/2021   Procedure: LAPAROSCOPIC RIGHT HEMI COLECTOMY;  Surgeon: Andria Meuse, MD;  Location: [facility];  Service: General;  Laterality: Right;   laporoscopy     MOUTH SURGERY     SUBMUCOSAL LIFTING INJECTION  04/07/2021   Procedure: SUBMUCOSAL LIFTING INJECTION;  Surgeon: Lemar Lofty., MD;  Location: [facility];  Service: Gastroenterology;;   TUBAL LIGATION     WISDOM TOOTH EXTRACTION     Patient Active Problem  List   Diagnosis Date Noted   Numbness of fingers of both hands 10/12/2021   Glaucoma suspect 05/27/2021   Serrated polyp of colon 04/02/2021   Hx of adenomatous colonic polyps 04/02/2021   Cecal polyp 04/02/2021   Abnormal colonoscopy 04/02/2021   Cough due to ACE inhibitor 03/05/2021   Obesity (BMI 30-39.9) 03/05/2021   Essential hypertension 12/25/2020   Prediabetes 12/25/2020   Perimenopausal 12/25/2020   Tobacco dependence 12/01/2020   Incontinence of feces 12/01/2020   Abdominal pain 12/01/2020    PCP: Marcine Matar MD  REFERRING PROVIDER: Cristie Hem, PA-C  REFERRING DIAG: [phone] (ICD-10-CM) - Unilateral primary osteoarthritis, left knee  THERAPY DIAG:  Chronic pain of left knee  Chronic pain of right knee  Muscle weakness (generalized)  Difficulty in walking, not elsewhere classified  Rationale for Evaluation and Treatment: Rehabilitation  ONSET DATE: worsened in last 6 months  SUBJECTIVE:   SUBJECTIVE STATEMENT: Shelby Wyatt reports good early HEP compliance.    PERTINENT HISTORY: History of colon cancer, HTN, pre-diabetes.  History of Lt knee arthritis.  She indicated banging Lt knee on door in 2017 with severe pain and had physical therapy then for symptoms with improvements for a period of time.    PAIN:  NPRS scale: 0-6/10 this week Pain location: Lt knee > Rt knee Pain description: giving way Aggravating factors: stairs, squatting, movement  after inactivity Relieving factors: nothing specific   PRECAUTIONS: None  WEIGHT BEARING RESTRICTIONS: No  FALLS:  Has patient fallen in last 6 months? No  LIVING ENVIRONMENT: Lives in: House/apartment Stairs: flight of stairs Rt side hand rail going up   OCCUPATION: Work as Probation officer  PLOF: Independent, paint glasses, wreath making, playing with dog.   PATIENT GOALS: Reduce pain, stay better.   OBJECTIVE:   PATIENT SURVEYS:  10/07/2022 FOTO intake: 61   predicted:   67  COGNITION: 10/07/2022 Overall cognitive status: WFL    SENSATION: 10/07/2022 WFL  MUSCLE LENGTH: 10/07/2022 None performed  POSTURE:  10/07/2022 No Significant postural limitations  PALPATION: 10/07/2022 No specific trouble to light touch  LOWER EXTREMITY MMT:   MMT Right 10/07/2022 Left 10/07/2022  Hip flexion 5/5 5/5  Hip extension    Hip abduction    Hip adduction    Hip internal rotation    Hip external rotation    Knee flexion 5/5 5/5  Knee extension 5/5  41.1, 37.8 lbs 5/5  46.3, 42 lbs  Ankle dorsiflexion 5/5 5/5  Ankle plantarflexion    Ankle inversion    Ankle eversion     (Blank rows = not tested)  LOWER EXTREMITY ROM:  ROM Right 10/07/2022 Left 10/07/2022  Hip flexion    Hip extension    Hip abduction    Hip adduction    Hip internal rotation    Hip external rotation    Knee flexion 125 AROM in supine heel slide 120 AROM in supine heel slide  Knee extension 0 0  Ankle dorsiflexion    Ankle plantarflexion    Ankle inversion    Ankle eversion     (Blank rows = not tested)  LOWER EXTREMITY SPECIAL TESTS:  10/07/2022 No specific testing noted today  FUNCTIONAL TESTS:  10/25/2022:  5 x sit to stand 18 inch chair s UE assist:  20.63 seconds   10/07/2022 18 inch chair transfer: s UE assist with pain noted (both knees), pain in lowering as well Lt SLS: 20 seconds Rt SLS: 15 seconds  GAIT: 10/07/2022 Independent ambulation   TODAY'S TREATMENT                                                                          DATE:  10/27/2022 Nustep Lvl 5 5 mins LE only Quadriceps sets Seated & standing 10X 5 seconds each Bridging 10X 5 seconds Seated straight leg raises 5X each side  Functional Activities: Double Leg Press 75# 2 sets of 15 slow eccentrics Single Leg Press 25# 2 sets of 10 slow eccentrics   10/25/2022 Therex: Nustep Lvl 5 11 mins UE/LE Leg press Double leg 75 lbs x 15, single leg 2 x 10 31 lbs bilaterally Seated quad set c  SLR 2 x 10 bilateral Sit to stand to sit 18 inch chair s UE assist x5  Verbal review of existing HEP techniques.  Additional time spent in review.    TODAY'S TREATMENT  DATE:10/07/2022 Therex:    HEP instruction/performance c cues for techniques, handout provided.  Trial set performed of each for comprehension and symptom assessment.  See below for exercise list   PATIENT EDUCATION:  Education details: HEP, POC Person educated: Patient Education method: Explanation, Demonstration, Verbal cues, and Handouts Education comprehension: verbalized understanding, returned demonstration, and verbal cues required  HOME EXERCISE PROGRAM: Access Code: URL: [email or web address] Date: 10/07/2022 Prepared by: Chyrel Masson  Exercises - Supine Heel Slide  - 3-5 x daily - 7 x weekly - 1-2 sets - 10 reps - 2 hold - Seated Long Arc Quad  - 3-5 x daily - 7 x weekly - 1-2 sets - 10 reps - 2 hold - Supine Bridge  - 1-2 x daily - 7 x weekly - 1-2 sets - 10 reps - 2 hold - Seated Quad Set (Mirrored)  - 3-5 x daily - 7 x weekly - 1 sets - 10 reps - 5 hold - Seated Straight Leg Heel Taps  - 1-2 x daily - 7 x weekly - 3 sets - 10 reps - Sit to Stand  - 3 x daily - 7 x weekly - 1 sets - 10 reps  ASSESSMENT:  CLINICAL IMPRESSION: Aislyn did a good job with her home and clinic exercises.  We did emphasize avoiding knee hyper-extension, both with standing and exercises.  With continued quadriceps strength work, her prognosis to meet long-term goals remains good.   OBJECTIVE IMPAIRMENTS: decreased activity tolerance, decreased balance, decreased coordination, decreased endurance, decreased mobility, difficulty walking, decreased ROM, decreased strength, impaired perceived functional ability, impaired flexibility, improper body mechanics, and pain.   ACTIVITY LIMITATIONS: carrying, lifting, bending, sitting,  standing, squatting, stairs, transfers, and locomotion level  PARTICIPATION LIMITATIONS: meal prep, cleaning, laundry, interpersonal relationship, driving, shopping, community activity, and occupation  PERSONAL FACTORS:  History of colon cancer, HTN, pre-diabetes  are also affecting patient's functional outcome.   REHAB POTENTIAL: Good  CLINICAL DECISION MAKING: Stable/uncomplicated  EVALUATION COMPLEXITY: Low   GOALS: Goals reviewed with patient? Yes  SHORT TERM GOALS: (target date for Short term goals are 3 weeks 10/28/2022)   1.  Patient will demonstrate independent use of home exercise program to maintain progress from in clinic treatments.  Goal status: Met 10/27/2022  LONG TERM GOALS: (target dates for all long term goals are 10 weeks  12/16/2022 )   1. Patient will demonstrate/report pain at worst less than or equal to 2/10 to facilitate minimal limitation in daily activity secondary to pain symptoms.  Goal status: New   2. Patient will demonstrate independent use of home exercise program to facilitate ability to maintain/progress functional gains from skilled physical therapy services.  Goal status: New   3. Patient will demonstrate FOTO outcome > or = 67 % to indicate reduced disability due to condition.  Goal status: New   4.  Patient will demonstrate Lt LE MMT 5/5 throughout to faciltiate usual transfers, stairs, squatting at [facility] for daily life.   Goal status: New   5.  Patient will demonstrate knee AROM 0-120 degrees without symptoms for normal activity.  Goal status: New   6.  Patient will demonstrate/report ability to ascend/descend stairs with single rail assist c reciprocal gait pattern for household and work navigation.  Goal status: New     PLAN:  PT FREQUENCY: 1-2x/week  PT DURATION: 10 weeks  PLANNED INTERVENTIONS: Therapeutic exercises, Therapeutic activity, Neuro Muscular re-education, Balance training, Gait training, Patient/Family education,  Joint mobilization, Stair training, DME  instructions, Dry Needling, Electrical stimulation, Traction, Cryotherapy, vasopneumatic deviceMoist heat, Taping, Ultrasound, Ionotophoresis 4mg /ml Dexamethasone, and aquatic therapy, Manual therapy.  All included unless contraindicated  PLAN FOR NEXT SESSION: Quadriceps strengthening as appropriate, avoid knee hyper-extension.  Low level step down trials laterally.   Cherlyn Cushing PT, MPT 10/27/22  4:51 PM

## 2022-11-01 ENCOUNTER — Ambulatory Visit: Payer: Commercial Managed Care - PPO | Admitting: Rehabilitative and Restorative Service Providers"

## 2022-11-01 ENCOUNTER — Encounter: Payer: Self-pay | Admitting: Rehabilitative and Restorative Service Providers"

## 2022-11-01 DIAGNOSIS — G8929 Other chronic pain: Secondary | ICD-10-CM | POA: Diagnosis not present

## 2022-11-01 DIAGNOSIS — R262 Difficulty in walking, not elsewhere classified: Secondary | ICD-10-CM | POA: Diagnosis not present

## 2022-11-01 DIAGNOSIS — M6281 Muscle weakness (generalized): Secondary | ICD-10-CM | POA: Diagnosis not present

## 2022-11-01 DIAGNOSIS — M25562 Pain in left knee: Secondary | ICD-10-CM

## 2022-11-01 DIAGNOSIS — M25561 Pain in right knee: Secondary | ICD-10-CM | POA: Diagnosis not present

## 2022-11-01 NOTE — Therapy (Signed)
OUTPATIENT PHYSICAL THERAPY  TREATMENT   Patient Name: Shelby Wyatt MRN: [id number] DOB:1965-07-10, 57 y.o., female Today's Date: 11/01/2022  END OF SESSION:  PT End of Session - 11/01/22 1610     Visit Number 4    Number of Visits 20    Date for PT Re-Evaluation 12/16/22    Authorization Type [facility] [employer] $25 copay    Progress Note Due on Visit 10    PT Start Time 1611    PT Stop Time 1638    PT Time Calculation (min) 27 min    Activity Tolerance Patient tolerated treatment well    Behavior During Therapy [facility] for tasks assessed/performed              Past Medical History:  Diagnosis Date   Allergy    seasonal   Anemia    Cancer (HCC)    colon, cancer dec 2022   Hypertension    No pertinent past medical history    Pre-diabetes    Tubal pregnancy    Past Surgical History:  Procedure Laterality Date   COLONOSCOPY     COLONOSCOPY WITH PROPOFOL N/A 04/07/2021   Procedure: COLONOSCOPY WITH PROPOFOL;  Surgeon: Lemar Lofty., MD;  Location: [facility];  Service: Gastroenterology;  Laterality: N/A;   ENDOSCOPIC MUCOSAL RESECTION N/A 04/07/2021   Procedure: ENDOSCOPIC MUCOSAL RESECTION;  Surgeon: Meridee Score Netty Starring., MD;  Location: [facility];  Service: Gastroenterology;  Laterality: N/A;   HEMOSTASIS CLIP PLACEMENT  04/07/2021   Procedure: HEMOSTASIS CLIP PLACEMENT;  Surgeon: Lemar Lofty., MD;  Location: [facility];  Service: Gastroenterology;;   LAPAROSCOPIC RIGHT HEMI COLECTOMY Right 05/27/2021   Procedure: LAPAROSCOPIC RIGHT HEMI COLECTOMY;  Surgeon: Andria Meuse, MD;  Location: [facility];  Service: General;  Laterality: Right;   laporoscopy     MOUTH SURGERY     SUBMUCOSAL LIFTING INJECTION  04/07/2021   Procedure: SUBMUCOSAL LIFTING INJECTION;  Surgeon: Lemar Lofty., MD;  Location: [facility];  Service: Gastroenterology;;   TUBAL LIGATION     WISDOM TOOTH EXTRACTION     Patient Active Problem List    Diagnosis Date Noted   Numbness of fingers of both hands 10/12/2021   Glaucoma suspect 05/27/2021   Serrated polyp of colon 04/02/2021   Hx of adenomatous colonic polyps 04/02/2021   Cecal polyp 04/02/2021   Abnormal colonoscopy 04/02/2021   Cough due to ACE inhibitor 03/05/2021   Obesity (BMI 30-39.9) 03/05/2021   Essential hypertension 12/25/2020   Prediabetes 12/25/2020   Perimenopausal 12/25/2020   Tobacco dependence 12/01/2020   Incontinence of feces 12/01/2020   Abdominal pain 12/01/2020    PCP: Marcine Matar MD  REFERRING PROVIDER: Cristie Hem, PA-C  REFERRING DIAG: [phone] (ICD-10-CM) - Unilateral primary osteoarthritis, left knee  THERAPY DIAG:  Chronic pain of left knee  Chronic pain of right knee  Muscle weakness (generalized)  Difficulty in walking, not elsewhere classified  Rationale for Evaluation and Treatment: Rehabilitation  ONSET DATE: worsened in last 6 months  SUBJECTIVE:   SUBJECTIVE STATEMENT: She indicated doing pretty good for several days.  Reported doing a lot of stairs at work today and had to use handrail and felt some pain issues.   PERTINENT HISTORY: History of colon cancer, HTN, pre-diabetes.  History of Lt knee arthritis.  She indicated banging Lt knee on door in 2017 with severe pain and had physical therapy then for symptoms with improvements for a period of time.    PAIN:  NPRS  scale: 5/10 at worst in last few days Pain location: Lt knee > Rt knee Pain description: giving way Aggravating factors: stairs, squatting, movement after inactivity Relieving factors: nothing specific   PRECAUTIONS: None  WEIGHT BEARING RESTRICTIONS: No  FALLS:  Has patient fallen in last 6 months? No  LIVING ENVIRONMENT: Lives in: House/apartment Stairs: flight of stairs Rt side hand rail going up   OCCUPATION: Work as Probation officer  PLOF: Independent, paint glasses, wreath making, playing with dog.   PATIENT GOALS: Reduce  pain, stay better.   OBJECTIVE:   PATIENT SURVEYS:  10/07/2022 FOTO intake: 61   predicted:  67  COGNITION: 10/07/2022 Overall cognitive status: WFL    SENSATION: 10/07/2022 WFL  MUSCLE LENGTH: 10/07/2022 None performed  POSTURE:  10/07/2022 No Significant postural limitations  PALPATION: 10/07/2022 No specific trouble to light touch  LOWER EXTREMITY MMT:   MMT Right 10/07/2022 Left 10/07/2022 Right 11/01/2022 Left  11/01/2022  Hip flexion 5/5 5/5    Hip extension      Hip abduction      Hip adduction      Hip internal rotation      Hip external rotation      Knee flexion 5/5 5/5    Knee extension 5/5  41.1, 37.8 lbs 5/5  46.3, 42 lbs 5/5 43, 41 lbs 5/5 49, 47.7 lbs  Ankle dorsiflexion 5/5 5/5    Ankle plantarflexion      Ankle inversion      Ankle eversion       (Blank rows = not tested)  LOWER EXTREMITY ROM:  ROM Right 10/07/2022 Left 10/07/2022  Hip flexion    Hip extension    Hip abduction    Hip adduction    Hip internal rotation    Hip external rotation    Knee flexion 125 AROM in supine heel slide 120 AROM in supine heel slide  Knee extension 0 0  Ankle dorsiflexion    Ankle plantarflexion    Ankle inversion    Ankle eversion     (Blank rows = not tested)  LOWER EXTREMITY SPECIAL TESTS:  10/07/2022 No specific testing noted today  FUNCTIONAL TESTS:  10/25/2022:  5 x sit to stand 18 inch chair s UE assist:  20.63 seconds   10/07/2022 18 inch chair transfer: s UE assist with pain noted (both knees), pain in lowering as well Lt SLS: 20 seconds Rt SLS: 15 seconds  GAIT: 10/07/2022 Independent ambulation   TODAY'S TREATMENT                                                                          DATE:  11/01/2022 Therex: Nustep Lvl 6  8 mins UE/LE Leg press Double leg 81 lbs x 15, single leg 2 x 10 37 lbs bilaterally Knee extension double leg up, single leg lowering x15 - performed bilaterally (full range movement - no pain in knee  noted).  Additional time for slow movement control focus   TODAY'S TREATMENT  DATE: 10/27/2022 Nustep Lvl 5 5 mins LE only Quadriceps sets Seated & standing 10X 5 seconds each Bridging 10X 5 seconds Seated straight leg raises 5X each side  Functional Activities: Double Leg Press 75# 2 sets of 15 slow eccentrics Single Leg Press 25# 2 sets of 10 slow eccentrics   TODAY'S TREATMENT                                                                          DATE:  10/25/2022 Therex: Nustep Lvl 5 11 mins UE/LE Leg press Double leg 75 lbs x 15, single leg 2 x 10 31 lbs bilaterally Seated quad set c SLR 2 x 10 bilateral Sit to stand to sit 18 inch chair s UE assist x5  Verbal review of existing HEP techniques.  Additional time spent in review.      PATIENT EDUCATION:  Education details: HEP, POC Person educated: Patient Education method: Programmer, multimedia, Demonstration, Verbal cues, and Handouts Education comprehension: verbalized understanding, returned demonstration, and verbal cues required  HOME EXERCISE PROGRAM: Access Code: URL: [name] Date: 10/07/2022 Prepared by: Chyrel Masson  Exercises - Supine Heel Slide  - 3-5 x daily - 7 x weekly - 1-2 sets - 10 reps - 2 hold - Seated Long Arc Quad  - 3-5 x daily - 7 x weekly - 1-2 sets - 10 reps - 2 hold - Supine Bridge  - 1-2 x daily - 7 x weekly - 1-2 sets - 10 reps - 2 hold - Seated Quad Set (Mirrored)  - 3-5 x daily - 7 x weekly - 1 sets - 10 reps - 5 hold - Seated Straight Leg Heel Taps  - 1-2 x daily - 7 x weekly - 3 sets - 10 reps - Sit to Stand  - 3 x daily - 7 x weekly - 1 sets - 10 reps  ASSESSMENT:  CLINICAL IMPRESSION: Adjusted treatment time today due to arrival time later.   Dynamometry showed mild improvements compared to evaluation strength levels.  Continued WB strength deficits and symptoms noted in  functional activity that may continue to benefit from skilled PT services.    OBJECTIVE IMPAIRMENTS: decreased activity tolerance, decreased balance, decreased coordination, decreased endurance, decreased mobility, difficulty walking, decreased ROM, decreased strength, impaired perceived functional ability, impaired flexibility, improper body mechanics, and pain.   ACTIVITY LIMITATIONS: carrying, lifting, bending, sitting, standing, squatting, stairs, transfers, and locomotion level  PARTICIPATION LIMITATIONS: meal prep, cleaning, laundry, interpersonal relationship, driving, shopping, community activity, and occupation  PERSONAL FACTORS:  History of colon cancer, HTN, pre-diabetes  are also affecting patient's functional outcome.   REHAB POTENTIAL: Good  CLINICAL DECISION MAKING: Stable/uncomplicated  EVALUATION COMPLEXITY: Low   GOALS: Goals reviewed with patient? Yes  SHORT TERM GOALS: (target date for Short term goals are 3 weeks 10/28/2022)   1.  Patient will demonstrate independent use of home exercise program to maintain progress from in clinic treatments.  Goal status: Met 10/27/2022  LONG TERM GOALS: (target dates for all long term goals are 10 weeks  12/16/2022 )   1. Patient will demonstrate/report pain at worst less than or equal to 2/10 to facilitate minimal limitation in daily activity secondary to pain symptoms.  Goal  status: on going 11/01/2022   2. Patient will demonstrate independent use of home exercise program to facilitate ability to maintain/progress functional gains from skilled physical therapy services.  Goal status: on going 11/01/2022   3. Patient will demonstrate FOTO outcome > or = 67 % to indicate reduced disability due to condition.  Goal status: on going 11/01/2022   4.  Patient will demonstrate Lt LE MMT 5/5 throughout to faciltiate usual transfers, stairs, squatting at [facility] for daily life.   Goal status: on going 11/01/2022   5.  Patient will  demonstrate knee AROM 0-120 degrees without symptoms for normal activity.  Goal status: on going 11/01/2022   6.  Patient will demonstrate/report ability to ascend/descend stairs with single rail assist c reciprocal gait pattern for household and work navigation.  Goal status: on going 11/01/2022     PLAN:  PT FREQUENCY: 1-2x/week  PT DURATION: 10 weeks  PLANNED INTERVENTIONS: Therapeutic exercises, Therapeutic activity, Neuro Muscular re-education, Balance training, Gait training, Patient/Family education, Joint mobilization, Stair training, DME instructions, Dry Needling, Electrical stimulation, Traction, Cryotherapy, vasopneumatic deviceMoist heat, Taping, Ultrasound, Ionotophoresis 4mg /ml Dexamethasone, and aquatic therapy, Manual therapy.  All included unless contraindicated  PLAN FOR NEXT SESSION: Held steps today due to symptoms and step activity at work.  Progress as able.   Chyrel Masson, PT, DPT, OCS, ATC 11/01/22  4:34 PM

## 2022-11-02 ENCOUNTER — Ambulatory Visit: Payer: Commercial Managed Care - PPO | Admitting: Orthopaedic Surgery

## 2022-11-02 ENCOUNTER — Other Ambulatory Visit: Payer: Self-pay

## 2022-11-02 DIAGNOSIS — G5602 Carpal tunnel syndrome, left upper limb: Secondary | ICD-10-CM

## 2022-11-02 DIAGNOSIS — G5601 Carpal tunnel syndrome, right upper limb: Secondary | ICD-10-CM

## 2022-11-02 NOTE — Progress Notes (Signed)
Office Visit Note   Patient: Shelby Wyatt           Date of Birth: Jan 08, 1966           MRN: [id number] Visit Date: 11/02/2022              Requested by: Marcine Matar, MD [address] [place],  [place] [address] PCP: Marcine Matar, MD   Assessment & Plan: Visit Diagnoses:  1. Right carpal tunnel syndrome   2. Left carpal tunnel syndrome     Plan: Impression is bilateral carpal tunnel syndrome.  The patient has tried braces without significant relief but she would like to try new ones.  These were provided today.  We will also order EMGs to assess the severity.  Follow-up after the studies.  Follow-Up Instructions: No follow-ups on file.   Orders:  No orders of the defined types were placed in this encounter.  No orders of the defined types were placed in this encounter.     Procedures: No procedures performed   Clinical Data: No additional findings.   Subjective: Chief Complaint  Patient presents with   Left Wrist - Pain   Right Wrist - Pain    HPI This Belgrave is a 57 year old female here for evaluation of bilateral wrist and hand pain.  Endorses numbness in her hands with increased use.  She works in [employer] at [employer].  Often has to shake her hands and her symptoms are more pronounced at night. Review of Systems  Constitutional: Negative.   HENT: Negative.    Eyes: Negative.   Respiratory: Negative.    Cardiovascular: Negative.   Endocrine: Negative.   Musculoskeletal: Negative.   Neurological: Negative.   Hematological: Negative.   Psychiatric/Behavioral: Negative.    All other systems reviewed and are negative.    Objective: Vital Signs: There were no vitals taken for this visit.  Physical Exam Vitals and nursing note reviewed.  Constitutional:      Appearance: She is well-developed.  HENT:     Head: Normocephalic and atraumatic.  Pulmonary:     Effort: Pulmonary effort is normal.  Abdominal:      Palpations: Abdomen is soft.  Musculoskeletal:     Cervical back: Neck supple.  Skin:    General: Skin is warm.     Capillary Refill: Capillary refill takes less than 2 seconds.  Neurological:     Mental Status: She is alert and oriented to person, place, and time.  Psychiatric:        Behavior: Behavior normal.        Thought Content: Thought content normal.        Judgment: Judgment normal.     Ortho Exam Examination bilateral hands show no muscle atrophy and negative carpal tunnel compressive signs. Specialty Comments:  No specialty comments available.  Imaging: No results found.   PMFS History: Patient Active Problem List   Diagnosis Date Noted   Numbness of fingers of both hands 10/12/2021   Glaucoma suspect 05/27/2021   Serrated polyp of colon 04/02/2021   Hx of adenomatous colonic polyps 04/02/2021   Cecal polyp 04/02/2021   Abnormal colonoscopy 04/02/2021   Cough due to ACE inhibitor 03/05/2021   Obesity (BMI 30-39.9) 03/05/2021   Essential hypertension 12/25/2020   Prediabetes 12/25/2020   Perimenopausal 12/25/2020   Tobacco dependence 12/01/2020   Incontinence of feces 12/01/2020   Abdominal pain 12/01/2020   Past Medical History:  Diagnosis Date  Allergy    seasonal   Anemia    Cancer (HCC)    colon, cancer dec 2022   Hypertension    No pertinent past medical history    Pre-diabetes    Tubal pregnancy     Family History  Problem Relation Age of Onset   Hypertension Mother    Diabetes Mother    Breast cancer Mother    Hypertension Maternal Grandmother    Esophageal cancer Paternal Grandmother    Hypertension Paternal Grandmother    Hypertension Paternal Grandfather    Colon cancer Neg Hx    Rectal cancer Neg Hx    Stomach cancer Neg Hx    Inflammatory bowel disease Neg Hx    Liver disease Neg Hx    Pancreatic cancer Neg Hx    Colon polyps Neg Hx    Crohn's disease Neg Hx    Ulcerative colitis Neg Hx     Past Surgical History:   Procedure Laterality Date   COLONOSCOPY     COLONOSCOPY WITH PROPOFOL N/A 04/07/2021   Procedure: COLONOSCOPY WITH PROPOFOL;  Surgeon: Lemar Lofty., MD;  Location: [facility];  Service: Gastroenterology;  Laterality: N/A;   ENDOSCOPIC MUCOSAL RESECTION N/A 04/07/2021   Procedure: ENDOSCOPIC MUCOSAL RESECTION;  Surgeon: Meridee Score Netty Starring., MD;  Location: [facility];  Service: Gastroenterology;  Laterality: N/A;   HEMOSTASIS CLIP PLACEMENT  04/07/2021   Procedure: HEMOSTASIS CLIP PLACEMENT;  Surgeon: Lemar Lofty., MD;  Location: [facility];  Service: Gastroenterology;;   LAPAROSCOPIC RIGHT HEMI COLECTOMY Right 05/27/2021   Procedure: LAPAROSCOPIC RIGHT HEMI COLECTOMY;  Surgeon: Andria Meuse, MD;  Location: [facility];  Service: General;  Laterality: Right;   laporoscopy     MOUTH SURGERY     SUBMUCOSAL LIFTING INJECTION  04/07/2021   Procedure: SUBMUCOSAL LIFTING INJECTION;  Surgeon: Lemar Lofty., MD;  Location: [facility];  Service: Gastroenterology;;   TUBAL LIGATION     WISDOM TOOTH EXTRACTION     Social History   Occupational History   Occupation: Food services - [employer]  Tobacco Use   Smoking status: Every Day    Packs/day: .25    Types: Cigarettes   Smokeless tobacco: Never  Vaping Use   Vaping Use: Never used  Substance and Sexual Activity   Alcohol use: Yes    Comment: occasionally   Drug use: Yes    Types: Marijuana    Comment: Smoked at the beginning of month   Sexual activity: Yes    Birth control/protection: None

## 2022-11-03 ENCOUNTER — Ambulatory Visit: Payer: Commercial Managed Care - PPO | Admitting: Rehabilitative and Restorative Service Providers"

## 2022-11-03 ENCOUNTER — Encounter: Payer: Self-pay | Admitting: Rehabilitative and Restorative Service Providers"

## 2022-11-03 DIAGNOSIS — G8929 Other chronic pain: Secondary | ICD-10-CM

## 2022-11-03 DIAGNOSIS — M25561 Pain in right knee: Secondary | ICD-10-CM

## 2022-11-03 DIAGNOSIS — M6281 Muscle weakness (generalized): Secondary | ICD-10-CM | POA: Diagnosis not present

## 2022-11-03 DIAGNOSIS — R262 Difficulty in walking, not elsewhere classified: Secondary | ICD-10-CM

## 2022-11-03 DIAGNOSIS — M25562 Pain in left knee: Secondary | ICD-10-CM | POA: Diagnosis not present

## 2022-11-03 NOTE — Therapy (Signed)
OUTPATIENT PHYSICAL THERAPY  TREATMENT   Patient Name: Shelby Wyatt MRN: [id number] DOB:07-31-1965, 57 y.o., female Today's Date: 11/03/2022  END OF SESSION:  PT End of Session - 11/03/22 1606     Visit Number 5    Number of Visits 20    Date for PT Re-Evaluation 12/16/22    Authorization Type [employer] $25 copay    Progress Note Due on Visit 10    PT Start Time 1559    PT Stop Time 1637    PT Time Calculation (min) 38 min    Activity Tolerance Patient tolerated treatment well    Behavior During Therapy [facility] for tasks assessed/performed               Past Medical History:  Diagnosis Date   Allergy    seasonal   Anemia    Cancer (HCC)    colon, cancer dec 2022   Hypertension    No pertinent past medical history    Pre-diabetes    Tubal pregnancy    Past Surgical History:  Procedure Laterality Date   COLONOSCOPY     COLONOSCOPY WITH PROPOFOL N/A 04/07/2021   Procedure: COLONOSCOPY WITH PROPOFOL;  Surgeon: Lemar Lofty., MD;  Location: [facility];  Service: Gastroenterology;  Laterality: N/A;   ENDOSCOPIC MUCOSAL RESECTION N/A 04/07/2021   Procedure: ENDOSCOPIC MUCOSAL RESECTION;  Surgeon: Meridee Score Netty Starring., MD;  Location: [facility];  Service: Gastroenterology;  Laterality: N/A;   HEMOSTASIS CLIP PLACEMENT  04/07/2021   Procedure: HEMOSTASIS CLIP PLACEMENT;  Surgeon: Lemar Lofty., MD;  Location: [facility];  Service: Gastroenterology;;   LAPAROSCOPIC RIGHT HEMI COLECTOMY Right 05/27/2021   Procedure: LAPAROSCOPIC RIGHT HEMI COLECTOMY;  Surgeon: Andria Meuse, MD;  Location: [facility];  Service: General;  Laterality: Right;   laporoscopy     MOUTH SURGERY     SUBMUCOSAL LIFTING INJECTION  04/07/2021   Procedure: SUBMUCOSAL LIFTING INJECTION;  Surgeon: Lemar Lofty., MD;  Location: [facility];  Service: Gastroenterology;;   TUBAL LIGATION     WISDOM TOOTH EXTRACTION     Patient Active Problem List    Diagnosis Date Noted   Numbness of fingers of both hands 10/12/2021   Glaucoma suspect 05/27/2021   Serrated polyp of colon 04/02/2021   Hx of adenomatous colonic polyps 04/02/2021   Cecal polyp 04/02/2021   Abnormal colonoscopy 04/02/2021   Cough due to ACE inhibitor 03/05/2021   Obesity (BMI 30-39.9) 03/05/2021   Essential hypertension 12/25/2020   Prediabetes 12/25/2020   Perimenopausal 12/25/2020   Tobacco dependence 12/01/2020   Incontinence of feces 12/01/2020   Abdominal pain 12/01/2020    PCP: Marcine Matar MD  REFERRING PROVIDER: Cristie Hem, PA-C  REFERRING DIAG: [id number] (ICD-10-CM) - Unilateral primary osteoarthritis, left knee  THERAPY DIAG:  Chronic pain of left knee  Chronic pain of right knee  Muscle weakness (generalized)  Difficulty in walking, not elsewhere classified  Rationale for Evaluation and Treatment: Rehabilitation  ONSET DATE: worsened in last 6 months  SUBJECTIVE:   SUBJECTIVE STATEMENT: She indicated no specific pain upon arrival today.  Reported stairs were doing better this morning and was able to cut whole yard without trouble from pain   PERTINENT HISTORY: History of colon cancer, HTN, pre-diabetes.  History of Lt knee arthritis.  She indicated banging Lt knee on door in 2017 with severe pain and had physical therapy then for symptoms with improvements for a period of time.    PAIN:  NPRS  scale: 0/10 Pain location: Lt knee > Rt knee Pain description: giving way Aggravating factors: stairs, squatting, movement after inactivity Relieving factors: nothing specific   PRECAUTIONS: None  WEIGHT BEARING RESTRICTIONS: No  FALLS:  Has patient fallen in last 6 months? No  LIVING ENVIRONMENT: Lives in: House/apartment Stairs: flight of stairs Rt side hand rail going up   OCCUPATION: Work as Probation officer  PLOF: Independent, paint glasses, wreath making, playing with dog.   PATIENT GOALS: Reduce pain, stay better.    OBJECTIVE:   PATIENT SURVEYS:  11/03/2022: FOTO update: 60  10/07/2022 FOTO intake: 61   predicted:  67  COGNITION: 10/07/2022 Overall cognitive status: WFL    SENSATION: 10/07/2022 WFL  MUSCLE LENGTH: 10/07/2022 None performed  POSTURE:  10/07/2022 No Significant postural limitations  PALPATION: 10/07/2022 No specific trouble to light touch  LOWER EXTREMITY MMT:   MMT Right 10/07/2022 Left 10/07/2022 Right 11/01/2022 Left  11/01/2022  Hip flexion 5/5 5/5    Hip extension      Hip abduction      Hip adduction      Hip internal rotation      Hip external rotation      Knee flexion 5/5 5/5    Knee extension 5/5  41.1, 37.8 lbs 5/5  46.3, 42 lbs 5/5 43, 41 lbs 5/5 49, 47.7 lbs  Ankle dorsiflexion 5/5 5/5    Ankle plantarflexion      Ankle inversion      Ankle eversion       (Blank rows = not tested)  LOWER EXTREMITY ROM:  ROM Right 10/07/2022 Left 10/07/2022  Hip flexion    Hip extension    Hip abduction    Hip adduction    Hip internal rotation    Hip external rotation    Knee flexion 125 AROM in supine heel slide 120 AROM in supine heel slide  Knee extension 0 0  Ankle dorsiflexion    Ankle plantarflexion    Ankle inversion    Ankle eversion     (Blank rows = not tested)  LOWER EXTREMITY SPECIAL TESTS:  10/07/2022 No specific testing noted today  FUNCTIONAL TESTS:  10/25/2022:  5 x sit to stand 18 inch chair s UE assist:  20.63 seconds   10/07/2022 18 inch chair transfer: s UE assist with pain noted (both knees), pain in lowering as well Lt SLS: 20 seconds Rt SLS: 15 seconds  GAIT: 10/07/2022 Independent ambulation   TODAY'S TREATMENT                                                                          DATE:  11/03/2022 Therex: Nustep Lvl 6  11 mins UE/LE Leg press Double leg 87 lbs x 15, single leg 2 x 15 37 lbs bilaterally Knee extension double leg up, single leg lowering x15 - Lt only 5 lbs (full range movement - no pain in knee  noted).   Neuro Re-ed Lateral stepping 3 cones x 8 bilateral SLS c vector reach fwd/side, back x 8 each , performed bilaterally   TODAY'S TREATMENT  DATE:  11/01/2022 Therex: Nustep Lvl 6  8 mins UE/LE Leg press Double leg 81 lbs x 15, single leg 2 x 10 37 lbs bilaterally Knee extension double leg up, single leg lowering x15 - performed bilaterally (full range movement - no pain in knee noted).  Additional time for slow movement control focus   TODAY'S TREATMENT                                                                          DATE: 10/27/2022 Nustep Lvl 5 5 mins LE only Quadriceps sets Seated & standing 10X 5 seconds each Bridging 10X 5 seconds Seated straight leg raises 5X each side  Functional Activities: Double Leg Press 75# 2 sets of 15 slow eccentrics Single Leg Press 25# 2 sets of 10 slow eccentrics   TODAY'S TREATMENT                                                                          DATE:  10/25/2022 Therex: Nustep Lvl 5 11 mins UE/LE Leg press Double leg 75 lbs x 15, single leg 2 x 10 31 lbs bilaterally Seated quad set c SLR 2 x 10 bilateral Sit to stand to sit 18 inch chair s UE assist x5  Verbal review of existing HEP techniques.  Additional time spent in review.      PATIENT EDUCATION:  Education details: HEP, POC Person educated: Patient Education method: Programmer, multimedia, Demonstration, Verbal cues, and Handouts Education comprehension: verbalized understanding, returned demonstration, and verbal cues required  HOME EXERCISE PROGRAM: Access Code: URL: [email or web address] Date: 10/07/2022 Prepared by: Chyrel Masson  Exercises - Supine Heel Slide  - 3-5 x daily - 7 x weekly - 1-2 sets - 10 reps - 2 hold - Seated Long Arc Quad  - 3-5 x daily - 7 x weekly - 1-2 sets - 10 reps - 2 hold - Supine Bridge  - 1-2 x daily - 7 x weekly - 1-2 sets - 10 reps - 2  hold - Seated Quad Set (Mirrored)  - 3-5 x daily - 7 x weekly - 1 sets - 10 reps - 5 hold - Seated Straight Leg Heel Taps  - 1-2 x daily - 7 x weekly - 3 sets - 10 reps - Sit to Stand  - 3 x daily - 7 x weekly - 1 sets - 10 reps  ASSESSMENT:  CLINICAL IMPRESSION: Good reports about symptom reduction in activity since last visit.  FOTO reassessment was relatively unchanged today compared to eval.  Continued strengthening to continue to help improve WB activity.  As symptoms improve, plan to reduce frequency of clinic visits.    OBJECTIVE IMPAIRMENTS: decreased activity tolerance, decreased balance, decreased coordination, decreased endurance, decreased mobility, difficulty walking, decreased ROM, decreased strength, impaired perceived functional ability, impaired flexibility, improper body mechanics, and pain.   ACTIVITY LIMITATIONS: carrying, lifting, bending, sitting, standing, squatting, stairs, transfers, and locomotion level  PARTICIPATION LIMITATIONS: meal  prep, cleaning, laundry, interpersonal relationship, driving, shopping, community activity, and occupation  PERSONAL FACTORS:  History of colon cancer, HTN, pre-diabetes  are also affecting patient's functional outcome.   REHAB POTENTIAL: Good  CLINICAL DECISION MAKING: Stable/uncomplicated  EVALUATION COMPLEXITY: Low   GOALS: Goals reviewed with patient? Yes  SHORT TERM GOALS: (target date for Short term goals are 3 weeks 10/28/2022)   1.  Patient will demonstrate independent use of home exercise program to maintain progress from in clinic treatments.  Goal status: Met 10/27/2022  LONG TERM GOALS: (target dates for all long term goals are 10 weeks  12/16/2022 )   1. Patient will demonstrate/report pain at worst less than or equal to 2/10 to facilitate minimal limitation in daily activity secondary to pain symptoms.  Goal status: on going 11/01/2022   2. Patient will demonstrate independent use of home exercise program to  facilitate ability to maintain/progress functional gains from skilled physical therapy services.  Goal status: on going 11/01/2022   3. Patient will demonstrate FOTO outcome > or = 67 % to indicate reduced disability due to condition.  Goal status: on going 11/01/2022   4.  Patient will demonstrate Lt LE MMT 5/5 throughout to faciltiate usual transfers, stairs, squatting at [facility] for daily life.   Goal status: on going 11/01/2022   5.  Patient will demonstrate knee AROM 0-120 degrees without symptoms for normal activity.  Goal status: on going 11/01/2022   6.  Patient will demonstrate/report ability to ascend/descend stairs with single rail assist c reciprocal gait pattern for household and work navigation.  Goal status: on going 11/01/2022     PLAN:  PT FREQUENCY: 1-2x/week  PT DURATION: 10 weeks  PLANNED INTERVENTIONS: Therapeutic exercises, Therapeutic activity, Neuro Muscular re-education, Balance training, Gait training, Patient/Family education, Joint mobilization, Stair training, DME instructions, Dry Needling, Electrical stimulation, Traction, Cryotherapy, vasopneumatic deviceMoist heat, Taping, Ultrasound, Ionotophoresis 4mg /ml Dexamethasone, and aquatic therapy, Manual therapy.  All included unless contraindicated  PLAN FOR NEXT SESSION: Progressive strengthening  Chyrel Masson, PT, DPT, OCS, ATC 11/03/22  4:38 PM

## 2022-11-05 ENCOUNTER — Ambulatory Visit: Payer: Commercial Managed Care - PPO | Admitting: Orthopaedic Surgery

## 2022-11-10 ENCOUNTER — Encounter: Payer: Self-pay | Admitting: Rehabilitative and Restorative Service Providers"

## 2022-11-10 ENCOUNTER — Ambulatory Visit: Payer: Commercial Managed Care - PPO | Admitting: Rehabilitative and Restorative Service Providers"

## 2022-11-10 DIAGNOSIS — M25561 Pain in right knee: Secondary | ICD-10-CM | POA: Diagnosis not present

## 2022-11-10 DIAGNOSIS — M25562 Pain in left knee: Secondary | ICD-10-CM

## 2022-11-10 DIAGNOSIS — M6281 Muscle weakness (generalized): Secondary | ICD-10-CM | POA: Diagnosis not present

## 2022-11-10 DIAGNOSIS — R262 Difficulty in walking, not elsewhere classified: Secondary | ICD-10-CM

## 2022-11-10 DIAGNOSIS — G8929 Other chronic pain: Secondary | ICD-10-CM | POA: Diagnosis not present

## 2022-11-10 NOTE — Therapy (Signed)
OUTPATIENT PHYSICAL THERAPY  TREATMENT   Patient Name: Shelby Wyatt MRN: [id number] DOB:[birth year], 57 y.o., female Today's Date: 11/10/2022  END OF SESSION:  PT End of Session - 11/10/22 1649     Visit Number 6    Number of Visits 20    Date for PT Re-Evaluation 12/16/22    Authorization Type [facility] [employer] $25 copay    Authorization - Visit Number 6    Progress Note Due on Visit 10    PT Start Time 1611    PT Stop Time 1649    PT Time Calculation (min) 38 min    Activity Tolerance Patient tolerated treatment well;No increased pain;Patient limited by pain    Behavior During Therapy [facility] for tasks assessed/performed                Past Medical History:  Diagnosis Date   Allergy    seasonal   Anemia    Cancer (HCC)    colon, cancer dec 2022   Hypertension    No pertinent past medical history    Pre-diabetes    Tubal pregnancy    Past Surgical History:  Procedure Laterality Date   COLONOSCOPY     COLONOSCOPY WITH PROPOFOL N/A 04/07/2021   Procedure: COLONOSCOPY WITH PROPOFOL;  Surgeon: Shelby Wyatt., MD;  Location: [facility];  Service: Gastroenterology;  Laterality: N/A;   ENDOSCOPIC MUCOSAL RESECTION N/A 04/07/2021   Procedure: ENDOSCOPIC MUCOSAL RESECTION;  Surgeon: Shelby Wyatt., MD;  Location: [facility];  Service: Gastroenterology;  Laterality: N/A;   HEMOSTASIS CLIP PLACEMENT  04/07/2021   Procedure: HEMOSTASIS CLIP PLACEMENT;  Surgeon: Shelby Wyatt., MD;  Location: [facility];  Service: Gastroenterology;;   LAPAROSCOPIC RIGHT HEMI COLECTOMY Right 05/27/2021   Procedure: LAPAROSCOPIC RIGHT HEMI COLECTOMY;  Surgeon: Shelby Meuse, MD;  Location: [facility];  Service: General;  Laterality: Right;   laporoscopy     MOUTH SURGERY     SUBMUCOSAL LIFTING INJECTION  04/07/2021   Procedure: SUBMUCOSAL LIFTING INJECTION;  Surgeon: Shelby Wyatt., MD;  Location: [facility];  Service: Gastroenterology;;   TUBAL  LIGATION     WISDOM TOOTH EXTRACTION     Patient Active Problem List   Diagnosis Date Noted   Numbness of fingers of both hands 10/12/2021   Glaucoma suspect 05/27/2021   Serrated polyp of colon 04/02/2021   Hx of adenomatous colonic polyps 04/02/2021   Cecal polyp 04/02/2021   Abnormal colonoscopy 04/02/2021   Cough due to ACE inhibitor 03/05/2021   Obesity (BMI 30-39.9) 03/05/2021   Essential hypertension 12/25/2020   Prediabetes 12/25/2020   Perimenopausal 12/25/2020   Tobacco dependence 12/01/2020   Incontinence of feces 12/01/2020   Abdominal pain 12/01/2020    PCP: Shelby Matar MD  REFERRING PROVIDER: Cristie Hem, PA-C  REFERRING DIAG: [id number] (ICD-10-CM) - Unilateral primary osteoarthritis, left knee  THERAPY DIAG:  Chronic pain of left knee  Chronic pain of right knee  Muscle weakness (generalized)  Difficulty in walking, not elsewhere classified  Rationale for Evaluation and Treatment: Rehabilitation  ONSET DATE: worsened in last 6 months  SUBJECTIVE:   SUBJECTIVE STATEMENT: Shelby Wyatt notes difficulty with descending stairs.  Pain hasn't been bad, although she still has some feelings of the knee "giving way."  PERTINENT HISTORY: History of colon cancer, HTN, pre-diabetes.  History of Lt knee arthritis.  She indicated banging Lt knee on door in 2017 with severe pain and had physical therapy then for symptoms with improvements for a period  of time.    PAIN:  NPRS scale: 0-2/10 this week Pain location: Lt knee > Rt knee Pain description: giving way Aggravating factors: stairs, squatting, movement after inactivity Relieving factors: nothing specific   PRECAUTIONS: None  WEIGHT BEARING RESTRICTIONS: No  FALLS:  Has patient fallen in last 6 months? No  LIVING ENVIRONMENT: Lives in: House/apartment Stairs: flight of stairs Rt side hand rail going up   OCCUPATION: Work as Probation officer  PLOF: Independent, paint glasses, wreath  making, playing with dog.   PATIENT GOALS: Reduce pain, stay better.   OBJECTIVE:   PATIENT SURVEYS:  11/03/2022: FOTO update: 60  10/07/2022 FOTO intake: 61   predicted:  67  COGNITION: 10/07/2022 Overall cognitive status: WFL    SENSATION: 10/07/2022 WFL  MUSCLE LENGTH: 10/07/2022 None performed  POSTURE:  10/07/2022 No Significant postural limitations  PALPATION: 10/07/2022 No specific trouble to light touch  LOWER EXTREMITY MMT:   MMT Right 10/07/2022 Left 10/07/2022 Right 11/01/2022 Left  11/01/2022  Hip flexion 5/5 5/5    Hip extension      Hip abduction      Hip adduction      Hip internal rotation      Hip external rotation      Knee flexion 5/5 5/5    Knee extension 5/5  41.1, 37.8 lbs 5/5  46.3, 42 lbs 5/5 43, 41 lbs 5/5 49, 47.7 lbs  Ankle dorsiflexion 5/5 5/5    Ankle plantarflexion      Ankle inversion      Ankle eversion       (Blank rows = not tested)  LOWER EXTREMITY ROM:  ROM Right 10/07/2022 Left 10/07/2022  Hip flexion    Hip extension    Hip abduction    Hip adduction    Hip internal rotation    Hip external rotation    Knee flexion 125 AROM in supine heel slide 120 AROM in supine heel slide  Knee extension 0 0  Ankle dorsiflexion    Ankle plantarflexion    Ankle inversion    Ankle eversion     (Blank rows = not tested)  LOWER EXTREMITY SPECIAL TESTS:  10/07/2022 No specific testing noted today  FUNCTIONAL TESTS:  10/25/2022:  5 x sit to stand 18 inch chair s UE assist:  20.63 seconds   10/07/2022 18 inch chair transfer: s UE assist with pain noted (both knees), pain in lowering as well Lt SLS: 20 seconds Rt SLS: 15 seconds  GAIT: 10/07/2022 Independent ambulation   TODAY'S TREATMENT                                                                          DATE:   11/10/2022 There Ex Seated straight leg raises 2 sets of 5 slow eccentrics Seated knee extension machine 90-40 degrees up with both legs, down with left 0#  and right 5# 10X each slow eccentrics  Functional Activities (for sit to stand and stairs): Double Leg Press 87# 20X slow eccentrics Single Leg Press 43# right and 37# left 2 sets of 10 slow eccentrics Step-up and over 4, 6 and 8 inch step with slow eccentrics.  No hands 4 and 6 inch 10 reps and hands  PRN 8 inch 20X   11/03/2022 Therex: Nustep Lvl 6  11 mins UE/LE Leg press Double leg 87 lbs x 15, single leg 2 x 15 37 lbs bilaterally Knee extension double leg up, single leg lowering x15 - Lt only 5 lbs (full range movement - no pain in knee noted).   Neuro Re-ed Lateral stepping 3 cones x 8 bilateral SLS c vector reach fwd/side, back x 8 each , performed bilaterally   TODAY'S TREATMENT                                                                          DATE:  11/01/2022 Therex: Nustep Lvl 6  8 mins UE/LE Leg press Double leg 81 lbs x 15, single leg 2 x 10 37 lbs bilaterally Knee extension double leg up, single leg lowering x15 - performed bilaterally (full range movement - no pain in knee noted).  Additional time for slow movement control focus    PATIENT EDUCATION:  Education details: HEP, POC Person educated: Patient Education method: Explanation, Demonstration, Verbal cues, and Handouts Education comprehension: verbalized understanding, returned demonstration, and verbal cues required  HOME EXERCISE PROGRAM: Access Code: URL: [email or web address] Date: 10/07/2022 Prepared by: Chyrel Masson  Exercises - Supine Heel Slide  - 3-5 x daily - 7 x weekly - 1-2 sets - 10 reps - 2 hold - Seated Long Arc Quad  - 3-5 x daily - 7 x weekly - 1-2 sets - 10 reps - 2 hold - Supine Bridge  - 1-2 x daily - 7 x weekly - 1-2 sets - 10 reps - 2 hold - Seated Quad Set (Mirrored)  - 3-5 x daily - 7 x weekly - 1 sets - 10 reps - 5 hold - Seated Straight Leg Heel Taps  - 1-2 x daily - 7 x weekly - 3 sets - 10 reps - Sit to Stand  - 3 x daily - 7 x weekly - 1 sets -  10 reps  ASSESSMENT:  CLINICAL IMPRESSION: Carra continues to give great effort with her physical therapy.  Addressing quadriceps weakness is the focus of her home exercise and clinic program.  Noleen will continue to benefit from the recommended course of physical therapy to address impairments noted at evaluation.   OBJECTIVE IMPAIRMENTS: decreased activity tolerance, decreased balance, decreased coordination, decreased endurance, decreased mobility, difficulty walking, decreased ROM, decreased strength, impaired perceived functional ability, impaired flexibility, improper body mechanics, and pain.   ACTIVITY LIMITATIONS: carrying, lifting, bending, sitting, standing, squatting, stairs, transfers, and locomotion level  PARTICIPATION LIMITATIONS: meal prep, cleaning, laundry, interpersonal relationship, driving, shopping, community activity, and occupation  PERSONAL FACTORS:  History of colon cancer, HTN, pre-diabetes  are also affecting patient's functional outcome.   REHAB POTENTIAL: Good  CLINICAL DECISION MAKING: Stable/uncomplicated  EVALUATION COMPLEXITY: Low   GOALS: Goals reviewed with patient? Yes  SHORT TERM GOALS: (target date for Short term goals are 3 weeks 10/28/2022)   1.  Patient will demonstrate independent use of home exercise program to maintain progress from in clinic treatments.  Goal status: Met 10/27/2022  LONG TERM GOALS: (target dates for all long term goals are 10 weeks  12/16/2022 )   1. Patient will demonstrate/report pain  at worst less than or equal to 2/10 to facilitate minimal limitation in daily activity secondary to pain symptoms.  Goal status: On going 11/10/2022   2. Patient will demonstrate independent use of home exercise program to facilitate ability to maintain/progress functional gains from skilled physical therapy services.  Goal status: On going 11/10/2022   3. Patient will demonstrate FOTO outcome > or = 67 % to indicate reduced  disability due to condition.  Goal status: on going 11/01/2022   4.  Patient will demonstrate Lt LE MMT 5/5 throughout to faciltiate usual transfers, stairs, squatting at [facility] for daily life.   Goal status: On going 11/10/2022   5.  Patient will demonstrate knee AROM 0-120 degrees without symptoms for normal activity.  Goal status: on going 11/01/2022   6.  Patient will demonstrate/report ability to ascend/descend stairs with single rail assist c reciprocal gait pattern for household and work navigation.  Goal status: On going 11/10/2022     PLAN:  PT FREQUENCY: 1-2x/week  PT DURATION: 10 weeks  PLANNED INTERVENTIONS: Therapeutic exercises, Therapeutic activity, Neuro Muscular re-education, Balance training, Gait training, Patient/Family education, Joint mobilization, Stair training, DME instructions, Dry Needling, Electrical stimulation, Traction, Cryotherapy, vasopneumatic deviceMoist heat, Taping, Ultrasound, Ionotophoresis 4mg /ml Dexamethasone, and aquatic therapy, Manual therapy.  All included unless contraindicated  PLAN FOR NEXT SESSION: Progressive strengthening  Cherlyn Cushing, PT, MPT 11/10/22  4:54 PM

## 2022-11-15 ENCOUNTER — Ambulatory Visit (INDEPENDENT_AMBULATORY_CARE_PROVIDER_SITE_OTHER): Payer: Commercial Managed Care - PPO | Admitting: Rehabilitative and Restorative Service Providers"

## 2022-11-15 ENCOUNTER — Encounter: Payer: Self-pay | Admitting: Rehabilitative and Restorative Service Providers"

## 2022-11-15 DIAGNOSIS — R262 Difficulty in walking, not elsewhere classified: Secondary | ICD-10-CM | POA: Diagnosis not present

## 2022-11-15 DIAGNOSIS — M6281 Muscle weakness (generalized): Secondary | ICD-10-CM

## 2022-11-15 DIAGNOSIS — M25562 Pain in left knee: Secondary | ICD-10-CM

## 2022-11-15 DIAGNOSIS — G8929 Other chronic pain: Secondary | ICD-10-CM

## 2022-11-15 DIAGNOSIS — M25561 Pain in right knee: Secondary | ICD-10-CM

## 2022-11-15 NOTE — Therapy (Signed)
OUTPATIENT PHYSICAL THERAPY  TREATMENT   Patient Name: Shelby Wyatt MRN: [id number] DOB:October 11, 1965, 57 y.o., female Today's Date: 11/15/2022  END OF SESSION:  PT End of Session - 11/15/22 1611     Visit Number 7    Number of Visits 20    Date for PT Re-Evaluation 12/16/22    Authorization Type [facility] [employer] $25 copay    Progress Note Due on Visit 10    PT Start Time 1612    PT Stop Time 1637    PT Time Calculation (min) 25 min    Activity Tolerance Patient tolerated treatment well;No increased pain    Behavior During Therapy [facility] for tasks assessed/performed                 Past Medical History:  Diagnosis Date   Allergy    seasonal   Anemia    Cancer (HCC)    colon, cancer dec 2022   Hypertension    No pertinent past medical history    Pre-diabetes    Tubal pregnancy    Past Surgical History:  Procedure Laterality Date   COLONOSCOPY     COLONOSCOPY WITH PROPOFOL N/A 04/07/2021   Procedure: COLONOSCOPY WITH PROPOFOL;  Surgeon: Lemar Lofty., MD;  Location: [facility];  Service: Gastroenterology;  Laterality: N/A;   ENDOSCOPIC MUCOSAL RESECTION N/A 04/07/2021   Procedure: ENDOSCOPIC MUCOSAL RESECTION;  Surgeon: Meridee Score Netty Starring., MD;  Location: [facility];  Service: Gastroenterology;  Laterality: N/A;   HEMOSTASIS CLIP PLACEMENT  04/07/2021   Procedure: HEMOSTASIS CLIP PLACEMENT;  Surgeon: Lemar Lofty., MD;  Location: [facility];  Service: Gastroenterology;;   LAPAROSCOPIC RIGHT HEMI COLECTOMY Right 05/27/2021   Procedure: LAPAROSCOPIC RIGHT HEMI COLECTOMY;  Surgeon: Andria Meuse, MD;  Location: [facility];  Service: General;  Laterality: Right;   laporoscopy     MOUTH SURGERY     SUBMUCOSAL LIFTING INJECTION  04/07/2021   Procedure: SUBMUCOSAL LIFTING INJECTION;  Surgeon: Lemar Lofty., MD;  Location: [facility];  Service: Gastroenterology;;   TUBAL LIGATION     WISDOM TOOTH EXTRACTION     Patient Active  Problem List   Diagnosis Date Noted   Numbness of fingers of both hands 10/12/2021   Glaucoma suspect 05/27/2021   Serrated polyp of colon 04/02/2021   Hx of adenomatous colonic polyps 04/02/2021   Cecal polyp 04/02/2021   Abnormal colonoscopy 04/02/2021   Cough due to ACE inhibitor 03/05/2021   Obesity (BMI 30-39.9) 03/05/2021   Essential hypertension 12/25/2020   Prediabetes 12/25/2020   Perimenopausal 12/25/2020   Tobacco dependence 12/01/2020   Incontinence of feces 12/01/2020   Abdominal pain 12/01/2020    PCP: Marcine Matar MD  REFERRING PROVIDER: Cristie Hem, PA-C  REFERRING DIAG: [phone] (ICD-10-CM) - Unilateral primary osteoarthritis, left knee  THERAPY DIAG:  Chronic pain of left knee  Chronic pain of right knee  Muscle weakness (generalized)  Difficulty in walking, not elsewhere classified  Rationale for Evaluation and Treatment: Rehabilitation  ONSET DATE: worsened in last 6 months  SUBJECTIVE:   SUBJECTIVE STATEMENT: She indicated feeling continued reduced symptoms and improvements steadily in stair navigation.  She indicated she felt like she could make continued gains in HEP and wanted to trial period at home several weeks prior to next visit.     PERTINENT HISTORY: History of colon cancer, HTN, pre-diabetes.  History of Lt knee arthritis.  She indicated banging Lt knee on door in 2017 with severe pain and had physical  therapy then for symptoms with improvements for a period of time.    PAIN:  NPRS scale: no pain.  Pain location: Lt knee > Rt knee Pain description: giving way Aggravating factors: stairs, squatting, movement after inactivity Relieving factors: nothing specific   PRECAUTIONS: None  WEIGHT BEARING RESTRICTIONS: No  FALLS:  Has patient fallen in last 6 months? No  LIVING ENVIRONMENT: Lives in: House/apartment Stairs: flight of stairs Rt side hand rail going up   OCCUPATION: Work as Probation officer  PLOF:  Independent, paint glasses, wreath making, playing with dog.   PATIENT GOALS: Reduce pain, stay better.   OBJECTIVE:   PATIENT SURVEYS:  11/15/2022:  FOTO update:  69  11/03/2022: FOTO update: 60  10/07/2022 FOTO intake: 61   predicted:  67  COGNITION: 10/07/2022 Overall cognitive status: WFL    SENSATION: 10/07/2022 WFL  MUSCLE LENGTH: 10/07/2022 None performed  POSTURE:  10/07/2022 No Significant postural limitations  PALPATION: 10/07/2022 No specific trouble to light touch  LOWER EXTREMITY MMT:   MMT Right 10/07/2022 Left 10/07/2022 Right 11/01/2022 Left  11/01/2022 Right  Left   Hip flexion 5/5 5/5      Hip extension        Hip abduction        Hip adduction        Hip internal rotation        Hip external rotation        Knee flexion 5/5 5/5      Knee extension 5/5  41.1, 37.8 lbs 5/5  46.3, 42 lbs 5/5 43, 41 lbs 5/5 49, 47.7 lbs    Ankle dorsiflexion 5/5 5/5      Ankle plantarflexion        Ankle inversion        Ankle eversion         (Blank rows = not tested)  LOWER EXTREMITY ROM:  ROM Right 10/07/2022 Left 10/07/2022  Hip flexion    Hip extension    Hip abduction    Hip adduction    Hip internal rotation    Hip external rotation    Knee flexion 125 AROM in supine heel slide 120 AROM in supine heel slide  Knee extension 0 0  Ankle dorsiflexion    Ankle plantarflexion    Ankle inversion    Ankle eversion     (Blank rows = not tested)  LOWER EXTREMITY SPECIAL TESTS:  10/07/2022 No specific testing noted today  FUNCTIONAL TESTS:  11/15/2022:  5 x sit to stand 18 inch chair s UE assist:  11 seconds  10/25/2022:  5 x sit to stand 18 inch chair s UE assist:  20.63 seconds   10/07/2022 18 inch chair transfer: s UE assist with pain noted (both knees), pain in lowering as well Lt SLS: 20 seconds Rt SLS: 15 seconds  GAIT: 10/07/2022 Independent ambulation   TODAY'S TREATMENT                                                                           DATE: 11/15/2022 Therex: Nustep Lvl 6 10 mins UE/LE Leg press Double leg 87 lbs x 15, single leg 2 x 15 37 lbs bilaterally Touch down squat over  chair with foam pad in it x 10  5 x sit to stand testing   TODAY'S TREATMENT                                                                          DATE:  11/10/2022 There Ex Seated straight leg raises 2 sets of 5 slow eccentrics Seated knee extension machine 90-40 degrees up with both legs, down with left 0# and right 5# 10X each slow eccentrics  Functional Activities (for sit to stand and stairs): Double Leg Press 87# 20X slow eccentrics Single Leg Press 43# right and 37# left 2 sets of 10 slow eccentrics Step-up and over 4, 6 and 8 inch step with slow eccentrics.  No hands 4 and 6 inch 10 reps and hands PRN 8 inch 20X   TODAY'S TREATMENT                                                                          DATE: 11/03/2022 Therex: Nustep Lvl 6  11 mins UE/LE Leg press Double leg 87 lbs x 15, single leg 2 x 15 37 lbs bilaterally Knee extension double leg up, single leg lowering x15 - Lt only 5 lbs (full range movement - no pain in knee noted).   Neuro Re-ed Lateral stepping 3 cones x 8 bilateral SLS c vector reach fwd/side, back x 8 each , performed bilaterally   PATIENT EDUCATION:  Education details: HEP, POC Person educated: Patient Education method: Explanation, Demonstration, Verbal cues, and Handouts Education comprehension: verbalized understanding, returned demonstration, and verbal cues required  HOME EXERCISE PROGRAM: Access Code: URL: [email or web address] Date: 10/07/2022 Prepared by: Chyrel Masson  Exercises - Supine Heel Slide  - 3-5 x daily - 7 x weekly - 1-2 sets - 10 reps - 2 hold - Seated Long Arc Quad  - 3-5 x daily - 7 x weekly - 1-2 sets - 10 reps - 2 hold - Supine Bridge  - 1-2 x daily - 7 x weekly - 1-2 sets - 10 reps - 2 hold - Seated Quad Set (Mirrored)  - 3-5 x daily - 7  x weekly - 1 sets - 10 reps - 5 hold - Seated Straight Leg Heel Taps  - 1-2 x daily - 7 x weekly - 3 sets - 10 reps - Sit to Stand  - 3 x daily - 7 x weekly - 1 sets - 10 reps  ASSESSMENT:  CLINICAL IMPRESSION: FOTO reassessment showed gains and reaching predicted value goal.  Reviewed HEP for good knowledge for upcoming HEP trial.  Will plan to reassess strength upon return.  Overall improvements in strength at this time has contributed to improve knee symptoms and functional activity tolerance.   Decreased treatment time due to arrival time for appointment.   OBJECTIVE IMPAIRMENTS: decreased activity tolerance, decreased balance, decreased coordination, decreased endurance, decreased mobility, difficulty walking, decreased ROM, decreased strength, impaired perceived functional ability, impaired flexibility, improper  body mechanics, and pain.   ACTIVITY LIMITATIONS: carrying, lifting, bending, sitting, standing, squatting, stairs, transfers, and locomotion level  PARTICIPATION LIMITATIONS: meal prep, cleaning, laundry, interpersonal relationship, driving, shopping, community activity, and occupation  PERSONAL FACTORS:  History of colon cancer, HTN, pre-diabetes  are also affecting patient's functional outcome.   REHAB POTENTIAL: Good  CLINICAL DECISION MAKING: Stable/uncomplicated  EVALUATION COMPLEXITY: Low   GOALS: Goals reviewed with patient? Yes  SHORT TERM GOALS: (target date for Short term goals are 3 weeks 10/28/2022)   1.  Patient will demonstrate independent use of home exercise program to maintain progress from in clinic treatments.  Goal status: Met 10/27/2022  LONG TERM GOALS: (target dates for all long term goals are 10 weeks  12/16/2022 )   1. Patient will demonstrate/report pain at worst less than or equal to 2/10 to facilitate minimal limitation in daily activity secondary to pain symptoms.  Goal status: On going 11/10/2022   2. Patient will demonstrate  independent use of home exercise program to facilitate ability to maintain/progress functional gains from skilled physical therapy services.  Goal status: On going 11/10/2022   3. Patient will demonstrate FOTO outcome > or = 67 % to indicate reduced disability due to condition.  Goal status: Met 11/15/2022   4.  Patient will demonstrate Lt LE MMT 5/5 throughout to faciltiate usual transfers, stairs, squatting at [facility] for daily life.   Goal status: Met for knee MMT   5.  Patient will demonstrate knee AROM 0-120 degrees without symptoms for normal activity.  Goal status: on going 11/01/2022   6.  Patient will demonstrate/report ability to ascend/descend stairs with single rail assist c reciprocal gait pattern for household and work navigation.  Goal status: Met 11/15/2022     PLAN:  PT FREQUENCY: 1-2x/week  PT DURATION: 10 weeks  PLANNED INTERVENTIONS: Therapeutic exercises, Therapeutic activity, Neuro Muscular re-education, Balance training, Gait training, Patient/Family education, Joint mobilization, Stair training, DME instructions, Dry Needling, Electrical stimulation, Traction, Cryotherapy, vasopneumatic deviceMoist heat, Taping, Ultrasound, Ionotophoresis 4mg /ml Dexamethasone, and aquatic therapy, Manual therapy.  All included unless contraindicated  PLAN FOR NEXT SESSION: HEP trial for 2 weeks, reassess upon return.   Chyrel Masson, PT, DPT, OCS, ATC 11/15/22  4:37 PM

## 2022-11-16 ENCOUNTER — Encounter: Payer: Self-pay | Admitting: Rehabilitative and Restorative Service Providers"

## 2022-11-16 ENCOUNTER — Ambulatory Visit (INDEPENDENT_AMBULATORY_CARE_PROVIDER_SITE_OTHER): Payer: Commercial Managed Care - PPO | Admitting: Physical Medicine and Rehabilitation

## 2022-11-16 DIAGNOSIS — M25532 Pain in left wrist: Secondary | ICD-10-CM

## 2022-11-16 DIAGNOSIS — R531 Weakness: Secondary | ICD-10-CM

## 2022-11-16 DIAGNOSIS — M25531 Pain in right wrist: Secondary | ICD-10-CM | POA: Diagnosis not present

## 2022-11-16 DIAGNOSIS — R202 Paresthesia of skin: Secondary | ICD-10-CM | POA: Diagnosis not present

## 2022-11-16 NOTE — Progress Notes (Signed)
Functional Pain Scale - descriptive words and definitions  Mild (2)   Noticeable when not distracted/no impact on ADL's/sleep only slightly affected and able to   use both passive and active distraction for comfort. Mild range order  Average Pain 0  Right handed. Numbness in all fingers

## 2022-11-17 ENCOUNTER — Encounter: Payer: Self-pay | Admitting: Rehabilitative and Restorative Service Providers"

## 2022-11-17 ENCOUNTER — Ambulatory Visit: Payer: Commercial Managed Care - PPO | Admitting: Rehabilitative and Restorative Service Providers"

## 2022-11-17 DIAGNOSIS — R262 Difficulty in walking, not elsewhere classified: Secondary | ICD-10-CM

## 2022-11-17 DIAGNOSIS — G8929 Other chronic pain: Secondary | ICD-10-CM | POA: Diagnosis not present

## 2022-11-17 DIAGNOSIS — M25562 Pain in left knee: Secondary | ICD-10-CM | POA: Diagnosis not present

## 2022-11-17 DIAGNOSIS — M25561 Pain in right knee: Secondary | ICD-10-CM

## 2022-11-17 DIAGNOSIS — M6281 Muscle weakness (generalized): Secondary | ICD-10-CM | POA: Diagnosis not present

## 2022-11-17 NOTE — Progress Notes (Signed)
Shelby Wyatt - 57 y.o. female MRN [id number]  Date of birth: 1966/05/25  Office Visit Note: Visit Date: 11/16/2022 PCP: Marcine Matar, MD Referred by: Tarry Kos, MD  Subjective: No chief complaint on file.  HPI:  Shelby Wyatt is a 57 y.o. female who comes in today at the request of Dr. Glee Arvin for evaluation and management of chronic, worsening and severe pain, numbness and tingling in the Bilateral upper extremities.  Patient is Right hand dominant.  She reports about 1 year of chronic symptoms that are worsening to the point where it really is bothering her at night.  She gets most of her symptoms at night and she wakes up with a positive flick sign.  She has been using braces with some relief.  She endorses some symptoms if she is on a phone for a while but mainly her symptoms are occurring at night.  She works at the [employer].  She does have some difficulty with using the hands at times but again her main complaint is this numbness globally in all digits that occurs at night.  No real neck pain or frank radicular pain.  Has a diagnosis of prediabetes but not diabetes and is not insulin-dependent.  She typically follows with Dr. Roda Shutters for her knees for which she is in physical therapy at this point.  She has had some medication management with anti-inflammatories without relief.  Really endorses less in the way of pain and mostly numbness.   I spent more than 30 minutes speaking face-to-face with the patient with 50% of the time in counseling and discussing coordination of care.    Review of Systems  Musculoskeletal:  Positive for joint pain.  Neurological:  Positive for tingling.  All other systems reviewed and are negative.  Otherwise per HPI.  Assessment & Plan: Visit Diagnoses:    ICD-10-CM   1. Paresthesia of skin  R20.2 NCV with EMG (electromyography)    2. Bilateral wrist pain  M25.531    M25.532     3. 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  There is no electrodiagnostic evidence of other focal nerve entrapment, cervical radiculopathy or brachial plexopathy.  Recommendations: 1.  Follow-up with referring physician. 2.  Continue current management of symptoms.  Consider diagnostic and therapeutic carpal tunnel injection. 3.  Continue use of resting splint at night-time and as needed during the day.  Meds & Orders: No orders of the defined types were placed in this encounter.   Orders Placed This Encounter  Procedures   NCV with EMG (electromyography)    Follow-up: Return for  Glee Arvin, MD.   Procedures: No procedures performed  EMG & NCV Findings: Evaluation of the right median motor nerve showed reduced amplitude (4.8 mV).  The left median (across palm) sensory nerve showed no response (Palm) and prolonged distal peak latency (3.8 ms).  The right median (across palm) sensory nerve showed prolonged distal peak latency (Wrist, 4.6 ms) and prolonged distal peak latency (Palm, 2.1 ms).  All remaining nerves (as indicated in the following tables) were within normal limits.  All left vs. right side differences were within normal limits.    All examined muscles (as indicated in the following table) showed no evidence of electrical instability.    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  There is no electrodiagnostic evidence of other focal nerve entrapment, cervical radiculopathy or brachial plexopathy.  Recommendations: 1.  Follow-up with referring physician. 2.  Continue current management of symptoms.  Consider diagnostic and therapeutic carpal tunnel injection. 3.  Continue use of resting  splint at night-time and as needed during the day.  ___________________________ Elease Hashimoto Board Certified, American Board of Physical Medicine and Rehabilitation    Nerve Conduction Studies Anti Sensory Summary Table   Stim Site NR Peak (ms) Norm Peak (ms) P-T Amp (V) Norm P-T Amp Site1 Site2 Delta-P (ms) Dist (cm) Vel (m/s) Norm Vel (m/s)  Left Median Acr Palm Anti Sensory (2nd Digit)  32C  Wrist    *3.8 <3.6 27.2 >10 Wrist Palm  0.0    Palm *NR  <2.0          Right Median Acr Palm Anti Sensory (2nd Digit)  30.1C  Wrist    *4.6 <3.6 23.8 >10 Wrist Palm 2.5 0.0    Palm    *2.1 <2.0 13.8         Left Radial Anti Sensory (Base 1st Digit)  31.7C  Wrist    2.1 <3.1 36.5  Wrist Base 1st Digit 2.1 0.0    Right Radial Anti Sensory (Base 1st Digit)  30.5C  Wrist    2.2 <3.1 40.2  Wrist Base 1st Digit 2.2 0.0    Left Ulnar Anti Sensory (5th Digit)  32.3C  Wrist    3.0 <3.7 19.7 >15.0 Wrist 5th Digit 3.0 14.0 47 >38  Right Ulnar Anti Sensory (5th Digit)  30.8C  Wrist    2.9 <3.7 22.8 >15.0 Wrist 5th Digit 2.9 14.0 48 >38   Motor Summary Table   Stim Site NR Onset (ms) Norm Onset (ms) O-P Amp (mV) Norm O-P Amp Site1 Site2 Delta-0 (ms) Dist (cm) Vel (m/s) Norm Vel (m/s)  Left Median Motor (Abd Poll Brev)  32.1C  Wrist    4.0 <4.2 7.3 >5 Elbow Wrist 3.7 20.5 55 >50  Elbow    7.7  6.7         Right Median Motor (Abd Poll Brev)  30.9C  Wrist    4.1 <4.2 *4.8 >5 Elbow Wrist 3.9 21.5 55 >50  Elbow    8.0  4.6         Left Ulnar Motor (Abd Dig Min)  32C  Wrist    2.5 <4.2 9.7 >3 B Elbow Wrist 3.1 19.5 63 >53  B Elbow    5.6  9.5  A Elbow B Elbow 1.4 10.0 71 >53  A Elbow    7.0  9.5         Right Ulnar Motor (Abd Dig Min)  31.2C  Wrist    2.8 <4.2 9.9 >3 B Elbow Wrist 3.2 20.0 63 >53  B Elbow    6.0  9.7  A Elbow B Elbow 1.3 10.0 77 >53  A Elbow    7.3  8.9          EMG   Side Muscle Nerve Root Ins Act Fibs Psw Amp Dur Poly Recrt Int Dennie Bible Comment  Right Abd Poll Brev  Median C8-T1 Nml Nml Nml Nml Nml 0 Nml Nml   Right 1stDorInt Ulnar C8-T1 Nml Nml Nml Nml Nml 0 Nml Nml   Right PronatorTeres Median C6-7 Nml Nml Nml  Nml Nml 0 Nml Nml   Right Biceps Musculocut C5-6 Nml Nml Nml Nml Nml 0 Nml Nml   Right Deltoid Axillary C5-6 Nml Nml Nml Nml Nml 0 Nml Nml     Nerve Conduction Studies Anti Sensory Left/Right Comparison   Stim Site L Lat (ms) R Lat (ms) L-R Lat (ms) L Amp (V) R Amp (V) L-R Amp (%) Site1 Site2 L Vel (m/s) R Vel (m/s) L-R Vel (m/s)  Median Acr Palm Anti Sensory (2nd Digit)  32C  Wrist *3.8 *4.6 0.8 27.2 23.8 12.5 Wrist Palm     Palm  *2.1   13.8        Radial Anti Sensory (Base 1st Digit)  31.7C  Wrist 2.1 2.2 0.1 36.5 40.2 9.2 Wrist Base 1st Digit     Ulnar Anti Sensory (5th Digit)  32.3C  Wrist 3.0 2.9 0.1 19.7 22.8 13.6 Wrist 5th Digit 47 48 1   Motor Left/Right Comparison   Stim Site L Lat (ms) R Lat (ms) L-R Lat (ms) L Amp (mV) R Amp (mV) L-R Amp (%) Site1 Site2 L Vel (m/s) R Vel (m/s) L-R Vel (m/s)  Median Motor (Abd Poll Brev)  32.1C  Wrist 4.0 4.1 0.1 7.3 *4.8 34.2 Elbow Wrist 55 55 0  Elbow 7.7 8.0 0.3 6.7 4.6 31.3       Ulnar Motor (Abd Dig Min)  32C  Wrist 2.5 2.8 0.3 9.7 9.9 2.0 B Elbow Wrist 63 63 0  B Elbow 5.6 6.0 0.4 9.5 9.7 2.1 A Elbow B Elbow 71 77 6  A Elbow 7.0 7.3 0.3 9.5 8.9 6.3          Waveforms:                      Clinical History: No specialty comments available.     Objective:  VS:  HT:    WT:   BMI:     BP:   HR: bpm  TEMP: ( )  RESP:  Physical Exam Musculoskeletal:        General: No swelling, tenderness or deformity.     Comments: Inspection reveals no atrophy of the bilateral APB or FDI or hand intrinsics. There is no swelling, color changes, allodynia or dystrophic changes. There is 5 out of 5 strength in the bilateral wrist extension, finger abduction and long finger flexion. There is intact sensation to light touch in all dermatomal and peripheral nerve distributions.  There is a negative Tinel's test at the bilateral wrist and elbow. There is a positive  Phalen's test bilaterally. There is a negative Hoffmann's test bilaterally.  Skin:    General: Skin is warm and dry.     Findings: No erythema or rash.  Neurological:     General: No focal deficit present.     Mental Status: She is alert and oriented to person, place, and time.     Motor: No weakness or abnormal muscle tone.     Coordination: Coordination normal.  Psychiatric:        Mood and Affect: Mood normal.        Behavior: Behavior normal.      Imaging: No results found.

## 2022-11-17 NOTE — Procedures (Signed)
EMG & NCV Findings: Evaluation of the right median motor nerve showed reduced amplitude (4.8 mV).  The left median (across palm) sensory nerve showed no response (Palm) and prolonged distal peak latency (3.8 ms).  The right median (across palm) sensory nerve showed prolonged distal peak latency (Wrist, 4.6 ms) and prolonged distal peak latency (Palm, 2.1 ms).  All remaining nerves (as indicated in the following tables) were within normal limits.  All left vs. right side differences were within normal limits.    All examined muscles (as indicated in the following table) showed no evidence of electrical instability.    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  There is no electrodiagnostic evidence of other focal nerve entrapment, cervical radiculopathy or brachial plexopathy.  Recommendations: 1.  Follow-up with referring physician. 2.  Continue current management of symptoms.  Consider diagnostic and therapeutic carpal tunnel injection. 3.  Continue use of resting splint at night-time and as needed during the day.  ___________________________ Elease Hashimoto Board Certified, American Board of Physical Medicine and Rehabilitation    Nerve Conduction Studies Anti Sensory Summary Table   Stim Site NR Peak (ms) Norm Peak (ms) P-T Amp (V) Norm P-T Amp Site1 Site2 Delta-P (ms) Dist (cm) Vel (m/s) Norm Vel (m/s)  Left Median Acr Palm Anti Sensory (2nd Digit)  32C  Wrist    *3.8 <3.6 27.2 >10 Wrist Palm  0.0    Palm *NR  <2.0          Right Median Acr Palm Anti Sensory (2nd Digit)  30.1C  Wrist    *4.6 <3.6 23.8 >10 Wrist Palm 2.5 0.0    Palm    *2.1 <2.0 13.8         Left Radial Anti Sensory (Base 1st Digit)  31.7C  Wrist    2.1 <3.1 36.5  Wrist Base 1st Digit 2.1 0.0    Right Radial Anti Sensory (Base 1st Digit)  30.5C  Wrist    2.2  <3.1 40.2  Wrist Base 1st Digit 2.2 0.0    Left Ulnar Anti Sensory (5th Digit)  32.3C  Wrist    3.0 <3.7 19.7 >15.0 Wrist 5th Digit 3.0 14.0 47 >38  Right Ulnar Anti Sensory (5th Digit)  30.8C  Wrist    2.9 <3.7 22.8 >15.0 Wrist 5th Digit 2.9 14.0 48 >38   Motor Summary Table   Stim Site NR Onset (ms) Norm Onset (ms) O-P Amp (mV) Norm O-P Amp Site1 Site2 Delta-0 (ms) Dist (cm) Vel (m/s) Norm Vel (m/s)  Left Median Motor (Abd Poll Brev)  32.1C  Wrist    4.0 <4.2 7.3 >5 Elbow Wrist 3.7 20.5 55 >50  Elbow    7.7  6.7         Right Median Motor (Abd Poll Brev)  30.9C  Wrist    4.1 <4.2 *4.8 >5 Elbow Wrist 3.9 21.5 55 >50  Elbow    8.0  4.6         Left Ulnar Motor (Abd Dig Min)  32C  Wrist    2.5 <4.2 9.7 >3 B Elbow Wrist 3.1 19.5 63 >53  B Elbow    5.6  9.5  A Elbow B Elbow 1.4 10.0 71 >53  A Elbow    7.0  9.5         Right Ulnar Motor (Abd Dig Min)  31.2C  Wrist    2.8 <4.2 9.9 >3 B Elbow Wrist 3.2 20.0 63 >53  B Elbow    6.0  9.7  A Elbow B Elbow 1.3 10.0 77 >53  A Elbow    7.3  8.9          EMG   Side Muscle Nerve Root Ins Act Fibs Psw Amp Dur Poly Recrt Int Dennie Bible Comment  Right Abd Poll Brev Median C8-T1 Nml Nml Nml Nml Nml 0 Nml Nml   Right 1stDorInt Ulnar C8-T1 Nml Nml Nml Nml Nml 0 Nml Nml   Right PronatorTeres Median C6-7 Nml Nml Nml Nml Nml 0 Nml Nml   Right Biceps Musculocut C5-6 Nml Nml Nml Nml Nml 0 Nml Nml   Right Deltoid Axillary C5-6 Nml Nml Nml Nml Nml 0 Nml Nml     Nerve Conduction Studies Anti Sensory Left/Right Comparison   Stim Site L Lat (ms) R Lat (ms) L-R Lat (ms) L Amp (V) R Amp (V) L-R Amp (%) Site1 Site2 L Vel (m/s) R Vel (m/s) L-R Vel (m/s)  Median Acr Palm Anti Sensory (2nd Digit)  32C  Wrist *3.8 *4.6 0.8 27.2 23.8 12.5 Wrist Palm     Palm  *2.1   13.8        Radial Anti Sensory (Base 1st Digit)  31.7C  Wrist 2.1 2.2 0.1 36.5 40.2 9.2 Wrist Base 1st Digit     Ulnar Anti Sensory (5th Digit)  32.3C  Wrist 3.0 2.9 0.1 19.7 22.8 13.6 Wrist  5th Digit 47 48 1   Motor Left/Right Comparison   Stim Site L Lat (ms) R Lat (ms) L-R Lat (ms) L Amp (mV) R Amp (mV) L-R Amp (%) Site1 Site2 L Vel (m/s) R Vel (m/s) L-R Vel (m/s)  Median Motor (Abd Poll Brev)  32.1C  Wrist 4.0 4.1 0.1 7.3 *4.8 34.2 Elbow Wrist 55 55 0  Elbow 7.7 8.0 0.3 6.7 4.6 31.3       Ulnar Motor (Abd Dig Min)  32C  Wrist 2.5 2.8 0.3 9.7 9.9 2.0 B Elbow Wrist 63 63 0  B Elbow 5.6 6.0 0.4 9.5 9.7 2.1 A Elbow B Elbow 71 77 6  A Elbow 7.0 7.3 0.3 9.5 8.9 6.3          Waveforms:

## 2022-11-17 NOTE — Therapy (Signed)
OUTPATIENT PHYSICAL THERAPY  TREATMENT   Patient Name: Shelby Wyatt MRN: [id number] DOB:25-Jan-1966, 57 y.o., female Today's Date: 11/17/2022  END OF SESSION:  PT End of Session - 11/17/22 1610     Visit Number 8    Number of Visits 20    Date for PT Re-Evaluation 12/16/22    Authorization Type [facility] [employer] $25 copay    Authorization - Visit Number 8    Progress Note Due on Visit 10    PT Start Time 1607    PT Stop Time 1639    PT Time Calculation (min) 32 min    Activity Tolerance Patient tolerated treatment well    Behavior During Therapy [facility] for tasks assessed/performed                  Past Medical History:  Diagnosis Date   Allergy    seasonal   Anemia    Cancer (HCC)    colon, cancer dec 2022   Hypertension    No pertinent past medical history    Pre-diabetes    Tubal pregnancy    Past Surgical History:  Procedure Laterality Date   COLONOSCOPY     COLONOSCOPY WITH PROPOFOL N/A 04/07/2021   Procedure: COLONOSCOPY WITH PROPOFOL;  Surgeon: Lemar Lofty., MD;  Location: [facility];  Service: Gastroenterology;  Laterality: N/A;   ENDOSCOPIC MUCOSAL RESECTION N/A 04/07/2021   Procedure: ENDOSCOPIC MUCOSAL RESECTION;  Surgeon: Meridee Score Netty Starring., MD;  Location: [facility];  Service: Gastroenterology;  Laterality: N/A;   HEMOSTASIS CLIP PLACEMENT  04/07/2021   Procedure: HEMOSTASIS CLIP PLACEMENT;  Surgeon: Lemar Lofty., MD;  Location: [facility];  Service: Gastroenterology;;   LAPAROSCOPIC RIGHT HEMI COLECTOMY Right 05/27/2021   Procedure: LAPAROSCOPIC RIGHT HEMI COLECTOMY;  Surgeon: Andria Meuse, MD;  Location: [facility];  Service: General;  Laterality: Right;   laporoscopy     MOUTH SURGERY     SUBMUCOSAL LIFTING INJECTION  04/07/2021   Procedure: SUBMUCOSAL LIFTING INJECTION;  Surgeon: Lemar Lofty., MD;  Location: [facility];  Service: Gastroenterology;;   TUBAL LIGATION     WISDOM TOOTH EXTRACTION      Patient Active Problem List   Diagnosis Date Noted   Numbness of fingers of both hands 10/12/2021   Glaucoma suspect 05/27/2021   Serrated polyp of colon 04/02/2021   Hx of adenomatous colonic polyps 04/02/2021   Cecal polyp 04/02/2021   Abnormal colonoscopy 04/02/2021   Cough due to ACE inhibitor 03/05/2021   Obesity (BMI 30-39.9) 03/05/2021   Essential hypertension 12/25/2020   Prediabetes 12/25/2020   Perimenopausal 12/25/2020   Tobacco dependence 12/01/2020   Incontinence of feces 12/01/2020   Abdominal pain 12/01/2020    PCP: Marcine Matar MD  REFERRING PROVIDER: Cristie Hem, PA-C  REFERRING DIAG: [phone] (ICD-10-CM) - Unilateral primary osteoarthritis, left knee  THERAPY DIAG:  Chronic pain of left knee  Chronic pain of right knee  Muscle weakness (generalized)  Difficulty in walking, not elsewhere classified  Rationale for Evaluation and Treatment: Rehabilitation  ONSET DATE: worsened in last 6 months  SUBJECTIVE:   SUBJECTIVE STATEMENT: Reported hitting knee today that caused bruise feel like improved after several mins of walking.  No specific pain upon arrival other than that.    PERTINENT HISTORY: History of colon cancer, HTN, pre-diabetes.  History of Lt knee arthritis.  She indicated banging Lt knee on door in 2017 with severe pain and had physical therapy then for symptoms with improvements for a  period of time.    PAIN:  NPRS scale: no pain.  Pain location: Lt knee > Rt knee Pain description: giving way Aggravating factors: stairs, squatting, movement after inactivity Relieving factors: nothing specific   PRECAUTIONS: None  WEIGHT BEARING RESTRICTIONS: No  FALLS:  Has patient fallen in last 6 months? No  LIVING ENVIRONMENT: Lives in: House/apartment Stairs: flight of stairs Rt side hand rail going up   OCCUPATION: Work as Probation officer  PLOF: Independent, paint glasses, wreath making, playing with dog.   PATIENT  GOALS: Reduce pain, stay better.   OBJECTIVE:   PATIENT SURVEYS:  11/15/2022:  FOTO update:  69  11/03/2022: FOTO update: 60  10/07/2022 FOTO intake: 61   predicted:  67  COGNITION: 10/07/2022 Overall cognitive status: WFL    SENSATION: 10/07/2022 WFL  MUSCLE LENGTH: 10/07/2022 None performed  POSTURE:  10/07/2022 No Significant postural limitations  PALPATION: 10/07/2022 No specific trouble to light touch  LOWER EXTREMITY MMT:   MMT Right 10/07/2022 Left 10/07/2022 Right 11/01/2022 Left  11/01/2022 Right  Left   Hip flexion 5/5 5/5      Hip extension        Hip abduction        Hip adduction        Hip internal rotation        Hip external rotation        Knee flexion 5/5 5/5      Knee extension 5/5  41.1, 37.8 lbs 5/5  46.3, 42 lbs 5/5 43, 41 lbs 5/5 49, 47.7 lbs    Ankle dorsiflexion 5/5 5/5      Ankle plantarflexion        Ankle inversion        Ankle eversion         (Blank rows = not tested)  LOWER EXTREMITY ROM:  ROM Right 10/07/2022 Left 10/07/2022  Hip flexion    Hip extension    Hip abduction    Hip adduction    Hip internal rotation    Hip external rotation    Knee flexion 125 AROM in supine heel slide 120 AROM in supine heel slide  Knee extension 0 0  Ankle dorsiflexion    Ankle plantarflexion    Ankle inversion    Ankle eversion     (Blank rows = not tested)  LOWER EXTREMITY SPECIAL TESTS:  10/07/2022 No specific testing noted today  FUNCTIONAL TESTS:  11/15/2022:  5 x sit to stand 18 inch chair s UE assist:  11 seconds  10/25/2022:  5 x sit to stand 18 inch chair s UE assist:  20.63 seconds   10/07/2022 18 inch chair transfer: s UE assist with pain noted (both knees), pain in lowering as well Lt SLS: 20 seconds Rt SLS: 15 seconds  GAIT: 10/07/2022 Independent ambulation   TODAY'S TREATMENT                                                                          DATE: 11/17/2022 Therex: Nustep Lvl 6 12 mins UE/LE Step on over  and down 6 inch step x 15 each LE with light hand assist on bar.    Neuro Re-ed SLS with reactive blazepod light  touching contralateral LE 30 sec x 3 bilateral Trial of same exercise with UE on window touching as well but difficulty was too high.  Additional time spent for education of exercise sequencing.     TODAY'S TREATMENT                                                                          DATE: 11/15/2022 Therex: Nustep Lvl 6 10 mins UE/LE Leg press Double leg 87 lbs x 15, single leg 2 x 15 37 lbs bilaterally Touch down squat over chair with foam pad in it x 10  5 x sit to stand testing   TODAY'S TREATMENT                                                                          DATE:  11/10/2022 There Ex Seated straight leg raises 2 sets of 5 slow eccentrics Seated knee extension machine 90-40 degrees up with both legs, down with left 0# and right 5# 10X each slow eccentrics  Functional Activities (for sit to stand and stairs): Double Leg Press 87# 20X slow eccentrics Single Leg Press 43# right and 37# left 2 sets of 10 slow eccentrics Step-up and over 4, 6 and 8 inch step with slow eccentrics.  No hands 4 and 6 inch 10 reps and hands PRN 8 inch 20X   TODAY'S TREATMENT                                                                          DATE: 11/03/2022 Therex: Nustep Lvl 6  11 mins UE/LE Leg press Double leg 87 lbs x 15, single leg 2 x 15 37 lbs bilaterally Knee extension double leg up, single leg lowering x15 - Lt only 5 lbs (full range movement - no pain in knee noted).   Neuro Re-ed Lateral stepping 3 cones x 8 bilateral SLS c vector reach fwd/side, back x 8 each , performed bilaterally   PATIENT EDUCATION:  Education details: HEP, POC Person educated: Patient Education method: Explanation, Demonstration, Verbal cues, and Handouts Education comprehension: verbalized understanding, returned demonstration, and verbal cues required  HOME EXERCISE  PROGRAM: Access Code: URL: [email or web address] Date: 10/07/2022 Prepared by: Chyrel Masson  Exercises - Supine Heel Slide  - 3-5 x daily - 7 x weekly - 1-2 sets - 10 reps - 2 hold - Seated Long Arc Quad  - 3-5 x daily - 7 x weekly - 1-2 sets - 10 reps - 2 hold - Supine Bridge  - 1-2 x daily - 7 x weekly - 1-2 sets - 10 reps - 2 hold - Seated Quad Set (Mirrored)  - 3-5 x daily -  7 x weekly - 1 sets - 10 reps - 5 hold - Seated Straight Leg Heel Taps  - 1-2 x daily - 7 x weekly - 3 sets - 10 reps - Sit to Stand  - 3 x daily - 7 x weekly - 1 sets - 10 reps  ASSESSMENT:  CLINICAL IMPRESSION: Continued review of HEP use.  Plan to trial HEP for 2 weeks , return if necessary based off symptoms.  The stronger her legs are, the better she will be with stairs, squat movements.    OBJECTIVE IMPAIRMENTS: decreased activity tolerance, decreased balance, decreased coordination, decreased endurance, decreased mobility, difficulty walking, decreased ROM, decreased strength, impaired perceived functional ability, impaired flexibility, improper body mechanics, and pain.   ACTIVITY LIMITATIONS: carrying, lifting, bending, sitting, standing, squatting, stairs, transfers, and locomotion level  PARTICIPATION LIMITATIONS: meal prep, cleaning, laundry, interpersonal relationship, driving, shopping, community activity, and occupation  PERSONAL FACTORS:  History of colon cancer, HTN, pre-diabetes  are also affecting patient's functional outcome.   REHAB POTENTIAL: Good  CLINICAL DECISION MAKING: Stable/uncomplicated  EVALUATION COMPLEXITY: Low   GOALS: Goals reviewed with patient? Yes  SHORT TERM GOALS: (target date for Short term goals are 3 weeks 10/28/2022)   1.  Patient will demonstrate independent use of home exercise program to maintain progress from in clinic treatments.  Goal status: Met 10/27/2022  LONG TERM GOALS: (target dates for all long term goals are 10 weeks   12/16/2022 )   1. Patient will demonstrate/report pain at worst less than or equal to 2/10 to facilitate minimal limitation in daily activity secondary to pain symptoms.  Goal status: On going 11/10/2022   2. Patient will demonstrate independent use of home exercise program to facilitate ability to maintain/progress functional gains from skilled physical therapy services.  Goal status: On going 11/10/2022   3. Patient will demonstrate FOTO outcome > or = 67 % to indicate reduced disability due to condition.  Goal status: Met 11/15/2022   4.  Patient will demonstrate Lt LE MMT 5/5 throughout to faciltiate usual transfers, stairs, squatting at [facility] for daily life.   Goal status: Met for knee MMT   5.  Patient will demonstrate knee AROM 0-120 degrees without symptoms for normal activity.  Goal status: on going 11/01/2022   6.  Patient will demonstrate/report ability to ascend/descend stairs with single rail assist c reciprocal gait pattern for household and work navigation.  Goal status: Met 11/15/2022     PLAN:  PT FREQUENCY: 1-2x/week  PT DURATION: 10 weeks  PLANNED INTERVENTIONS: Therapeutic exercises, Therapeutic activity, Neuro Muscular re-education, Balance training, Gait training, Patient/Family education, Joint mobilization, Stair training, DME instructions, Dry Needling, Electrical stimulation, Traction, Cryotherapy, vasopneumatic deviceMoist heat, Taping, Ultrasound, Ionotophoresis 4mg /ml Dexamethasone, and aquatic therapy, Manual therapy.  All included unless contraindicated  PLAN FOR NEXT SESSION: HEP trial for 2 weeks   Chyrel Masson, PT, DPT, OCS, ATC 11/17/22  4:35 PM

## 2022-11-22 ENCOUNTER — Other Ambulatory Visit (HOSPITAL_COMMUNITY): Payer: Self-pay

## 2022-11-22 ENCOUNTER — Other Ambulatory Visit: Payer: Self-pay

## 2022-11-23 ENCOUNTER — Other Ambulatory Visit (HOSPITAL_COMMUNITY): Payer: Self-pay

## 2022-12-01 ENCOUNTER — Encounter: Payer: Self-pay | Admitting: Rehabilitative and Restorative Service Providers"

## 2022-12-01 ENCOUNTER — Ambulatory Visit: Payer: Commercial Managed Care - PPO | Admitting: Rehabilitative and Restorative Service Providers"

## 2022-12-01 DIAGNOSIS — R262 Difficulty in walking, not elsewhere classified: Secondary | ICD-10-CM

## 2022-12-01 DIAGNOSIS — G8929 Other chronic pain: Secondary | ICD-10-CM | POA: Diagnosis not present

## 2022-12-01 DIAGNOSIS — M25562 Pain in left knee: Secondary | ICD-10-CM

## 2022-12-01 DIAGNOSIS — M6281 Muscle weakness (generalized): Secondary | ICD-10-CM | POA: Diagnosis not present

## 2022-12-01 DIAGNOSIS — M25561 Pain in right knee: Secondary | ICD-10-CM

## 2022-12-01 NOTE — Therapy (Signed)
OUTPATIENT PHYSICAL THERAPY  TREATMENT /DISCHARGE   Patient Name: Shelby Wyatt MRN: [id number] DOB:03/15/1966, 57 y.o., female Today's Date: 12/01/2022  PHYSICAL THERAPY DISCHARGE SUMMARY  Visits from Start of Care: 9  Current functional level related to goals / functional outcomes: See note   Remaining deficits: See note   Education / Equipment: HEP  Patient goals were partially met. Patient is being discharged due to being pleased with the current functional level.   END OF SESSION:  PT End of Session - 12/01/22 1550     Visit Number 9    Number of Visits 20    Date for PT Re-Evaluation 12/16/22    Authorization Type [facility] [employer] $25 copay    Authorization - Visit Number 9    Progress Note Due on Visit 10    PT Start Time 1558    PT Stop Time 1625    PT Time Calculation (min) 27 min    Activity Tolerance Patient tolerated treatment well    Behavior During Therapy WFL for tasks assessed/performed                   Past Medical History:  Diagnosis Date   Allergy    seasonal   Anemia    Cancer (HCC)    colon, cancer dec 2022   Hypertension    No pertinent past medical history    Pre-diabetes    Tubal pregnancy    Past Surgical History:  Procedure Laterality Date   COLONOSCOPY     COLONOSCOPY WITH PROPOFOL N/A 04/07/2021   Procedure: COLONOSCOPY WITH PROPOFOL;  Surgeon: Lemar Lofty., MD;  Location: [facility];  Service: Gastroenterology;  Laterality: N/A;   ENDOSCOPIC MUCOSAL RESECTION N/A 04/07/2021   Procedure: ENDOSCOPIC MUCOSAL RESECTION;  Surgeon: Meridee Score Netty Starring., MD;  Location: [facility];  Service: Gastroenterology;  Laterality: N/A;   HEMOSTASIS CLIP PLACEMENT  04/07/2021   Procedure: HEMOSTASIS CLIP PLACEMENT;  Surgeon: Lemar Lofty., MD;  Location: [facility];  Service: Gastroenterology;;   LAPAROSCOPIC RIGHT HEMI COLECTOMY Right 05/27/2021   Procedure: LAPAROSCOPIC RIGHT HEMI COLECTOMY;  Surgeon:  Andria Meuse, MD;  Location: [facility];  Service: General;  Laterality: Right;   laporoscopy     MOUTH SURGERY     SUBMUCOSAL LIFTING INJECTION  04/07/2021   Procedure: SUBMUCOSAL LIFTING INJECTION;  Surgeon: Lemar Lofty., MD;  Location: [facility];  Service: Gastroenterology;;   TUBAL LIGATION     WISDOM TOOTH EXTRACTION     Patient Active Problem List   Diagnosis Date Noted   Numbness of fingers of both hands 10/12/2021   Glaucoma suspect 05/27/2021   Serrated polyp of colon 04/02/2021   Hx of adenomatous colonic polyps 04/02/2021   Cecal polyp 04/02/2021   Abnormal colonoscopy 04/02/2021   Cough due to ACE inhibitor 03/05/2021   Obesity (BMI 30-39.9) 03/05/2021   Essential hypertension 12/25/2020   Prediabetes 12/25/2020   Perimenopausal 12/25/2020   Tobacco dependence 12/01/2020   Incontinence of feces 12/01/2020   Abdominal pain 12/01/2020    PCP: Marcine Matar MD  REFERRING PROVIDER: Cristie Hem, PA-C  REFERRING DIAG: [phone] (ICD-10-CM) - Unilateral primary osteoarthritis, left knee  THERAPY DIAG:  Chronic pain of left knee  Chronic pain of right knee  Muscle weakness (generalized)  Difficulty in walking, not elsewhere classified  Rationale for Evaluation and Treatment: Rehabilitation  ONSET DATE: worsened in last 6 months  SUBJECTIVE:   SUBJECTIVE STATEMENT: She indicated doing better until mowing yard  and stepping in hole on Saturday.  She reported a little complaints in both knees with climbing stairs.   Stepping in hole was Lt side and hurt knee and ankle.    PERTINENT HISTORY: History of colon cancer, HTN, pre-diabetes.  History of Lt knee arthritis.  She indicated banging Lt knee on door in 2017 with severe pain and had physical therapy then for symptoms with improvements for a period of time.    PAIN:  NPRS scale: "a little".  Pain location: Lt knee > Rt knee Pain description: giving way Aggravating factors: stairs,  squatting, movement after inactivity Relieving factors: nothing specific   PRECAUTIONS: None  WEIGHT BEARING RESTRICTIONS: No  FALLS:  Has patient fallen in last 6 months? No  LIVING ENVIRONMENT: Lives in: House/apartment Stairs: flight of stairs Rt side hand rail going up   OCCUPATION: Work as Probation officer  PLOF: Independent, paint glasses, wreath making, playing with dog.   PATIENT GOALS: Reduce pain, stay better.   OBJECTIVE:   PATIENT SURVEYS:  11/15/2022:  FOTO update:  69  11/03/2022: FOTO update: 60  10/07/2022 FOTO intake: 61   predicted:  67  COGNITION: 10/07/2022 Overall cognitive status: WFL    SENSATION: 10/07/2022 WFL  MUSCLE LENGTH: 10/07/2022 None performed  POSTURE:  10/07/2022 No Significant postural limitations  PALPATION: 10/07/2022 No specific trouble to light touch  LOWER EXTREMITY MMT:   MMT Right 10/07/2022 Left 10/07/2022 Right 11/01/2022 Left  11/01/2022 Right  Left   Hip flexion 5/5 5/5      Hip extension        Hip abduction        Hip adduction        Hip internal rotation        Hip external rotation        Knee flexion 5/5 5/5      Knee extension 5/5  41.1, 37.8 lbs 5/5  46.3, 42 lbs 5/5 43, 41 lbs 5/5 49, 47.7 lbs    Ankle dorsiflexion 5/5 5/5      Ankle plantarflexion        Ankle inversion        Ankle eversion         (Blank rows = not tested)  LOWER EXTREMITY ROM:  ROM Right 10/07/2022 Left 10/07/2022  Hip flexion    Hip extension    Hip abduction    Hip adduction    Hip internal rotation    Hip external rotation    Knee flexion 125 AROM in supine heel slide 120 AROM in supine heel slide  Knee extension 0 0  Ankle dorsiflexion    Ankle plantarflexion    Ankle inversion    Ankle eversion     (Blank rows = not tested)  LOWER EXTREMITY SPECIAL TESTS:  10/07/2022 No specific testing noted today  FUNCTIONAL TESTS:  11/15/2022:  5 x sit to stand 18 inch chair s UE assist:  11 seconds  10/25/2022:   5 x sit to stand 18 inch chair s UE assist:  20.63 seconds   10/07/2022 18 inch chair transfer: s UE assist with pain noted (both knees), pain in lowering as well Lt SLS: 20 seconds Rt SLS: 15 seconds  GAIT: 10/07/2022 Independent ambulation   TODAY'S TREATMENT  DATE: 12/01/2022 Therex: Nustep Lvl 6 15 mins UE/LE Step on over and down 6 inch step x 15 each LE with light hand assist on bar.    TODAY'S TREATMENT                                                                          DATE: 11/17/2022 Therex: Nustep Lvl 6 12 mins UE/LE Step on over and down 6 inch step x 15 each LE with light hand assist on bar.    Neuro Re-ed SLS with reactive blazepod light touching contralateral LE 30 sec x 3 bilateral Trial of same exercise with UE on window touching as well but difficulty was too high.  Additional time spent for education of exercise sequencing.     TODAY'S TREATMENT                                                                          DATE: 11/15/2022 Therex: Nustep Lvl 6 10 mins UE/LE Leg press Double leg 87 lbs x 15, single leg 2 x 15 37 lbs bilaterally Touch down squat over chair with foam pad in it x 10  5 x sit to stand testing   TODAY'S TREATMENT                                                                          DATE:  11/10/2022 There Ex Seated straight leg raises 2 sets of 5 slow eccentrics Seated knee extension machine 90-40 degrees up with both legs, down with left 0# and right 5# 10X each slow eccentrics  Functional Activities (for sit to stand and stairs): Double Leg Press 87# 20X slow eccentrics Single Leg Press 43# right and 37# left 2 sets of 10 slow eccentrics Step-up and over 4, 6 and 8 inch step with slow eccentrics.  No hands 4 and 6 inch 10 reps and hands PRN 8 inch 20X   PATIENT EDUCATION:  Education details: HEP, POC Person educated: Patient Education method:  Explanation, Demonstration, Verbal cues, and Handouts Education comprehension: verbalized understanding, returned demonstration, and verbal cues required  HOME EXERCISE PROGRAM: Access Code: URL: [email or web address] Date: 10/07/2022 Prepared by: Chyrel Masson  Exercises - Supine Heel Slide  - 3-5 x daily - 7 x weekly - 1-2 sets - 10 reps - 2 hold - Seated Long Arc Quad  - 3-5 x daily - 7 x weekly - 1-2 sets - 10 reps - 2 hold - Supine Bridge  - 1-2 x daily - 7 x weekly - 1-2 sets - 10 reps - 2 hold - Seated Quad Set (Mirrored)  - 3-5 x daily - 7 x weekly - 1 sets -  10 reps - 5 hold - Seated Straight Leg Heel Taps  - 1-2 x daily - 7 x weekly - 3 sets - 10 reps - Sit to Stand  - 3 x daily - 7 x weekly - 1 sets - 10 reps  ASSESSMENT:  CLINICAL IMPRESSION: Recommended for discharge to HEP at this time despite incident in hole stepping twist.  Good knowledge of HEP and overall improvement.  May return in future if necessary.   OBJECTIVE IMPAIRMENTS: decreased activity tolerance, decreased balance, decreased coordination, decreased endurance, decreased mobility, difficulty walking, decreased ROM, decreased strength, impaired perceived functional ability, impaired flexibility, improper body mechanics, and pain.   ACTIVITY LIMITATIONS: carrying, lifting, bending, sitting, standing, squatting, stairs, transfers, and locomotion level  PARTICIPATION LIMITATIONS: meal prep, cleaning, laundry, interpersonal relationship, driving, shopping, community activity, and occupation  PERSONAL FACTORS:  History of colon cancer, HTN, pre-diabetes  are also affecting patient's functional outcome.   REHAB POTENTIAL: Good  CLINICAL DECISION MAKING: Stable/uncomplicated  EVALUATION COMPLEXITY: Low   GOALS: Goals reviewed with patient? Yes  SHORT TERM GOALS: (target date for Short term goals are 3 weeks 10/28/2022)   1.  Patient will demonstrate independent use of home exercise  program to maintain progress from in clinic treatments.  Goal status: Met 10/27/2022  LONG TERM GOALS: (target dates for all long term goals are 10 weeks  12/16/2022 )   1. Patient will demonstrate/report pain at worst less than or equal to 2/10 to facilitate minimal limitation in daily activity secondary to pain symptoms.  Goal status: On going 11/10/2022   2. Patient will demonstrate independent use of home exercise program to facilitate ability to maintain/progress functional gains from skilled physical therapy services.  Goal status: On going 11/10/2022   3. Patient will demonstrate FOTO outcome > or = 67 % to indicate reduced disability due to condition.  Goal status: Met 11/15/2022   4.  Patient will demonstrate Lt LE MMT 5/5 throughout to faciltiate usual transfers, stairs, squatting at [facility] for daily life.   Goal status: Met for knee MMT   5.  Patient will demonstrate knee AROM 0-120 degrees without symptoms for normal activity.  Goal status: on going 11/01/2022   6.  Patient will demonstrate/report ability to ascend/descend stairs with single rail assist c reciprocal gait pattern for household and work navigation.  Goal status: Met 11/15/2022     PLAN:  PT FREQUENCY: 1-2x/week  PT DURATION: 10 weeks  PLANNED INTERVENTIONS: Therapeutic exercises, Therapeutic activity, Neuro Muscular re-education, Balance training, Gait training, Patient/Family education, Joint mobilization, Stair training, DME instructions, Dry Needling, Electrical stimulation, Traction, Cryotherapy, vasopneumatic deviceMoist heat, Taping, Ultrasound, Ionotophoresis 4mg /ml Dexamethasone, and aquatic therapy, Manual therapy.  All included unless contraindicated  PLAN FOR NEXT SESSION: Discharge.     Chyrel Masson, PT, DPT, OCS, ATC 12/01/22  4:25 PM

## 2022-12-09 ENCOUNTER — Other Ambulatory Visit (HOSPITAL_COMMUNITY): Payer: Self-pay

## 2022-12-14 IMAGING — MG MM DIGITAL SCREENING BILAT W/ TOMO AND CAD
8 series · 8 of 24 positions shown · non-contrast
Comparison: Previous exam(s).

CLINICAL DATA: Screening.

EXAM:
DIGITAL SCREENING BILATERAL MAMMOGRAM WITH TOMOSYNTHESIS AND CAD
TECHNIQUE: Bilateral screening digital craniocaudal and mediolateral oblique
mammograms were obtained. Bilateral screening digital breast
tomosynthesis was performed. The images were evaluated with
computer-aided detection.

[R CC synth-2D]
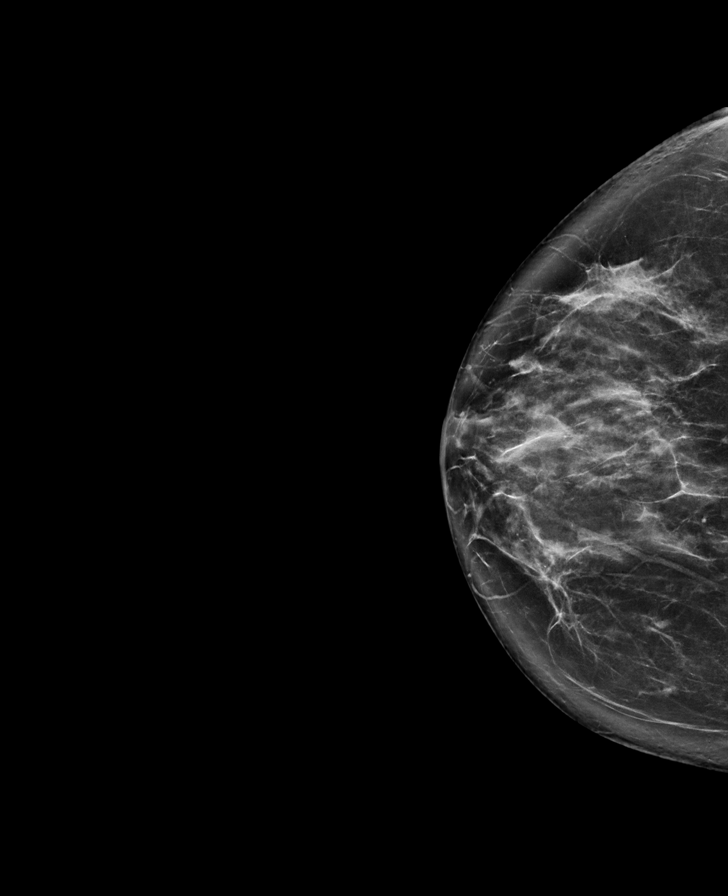

[L MLO synth-2D]
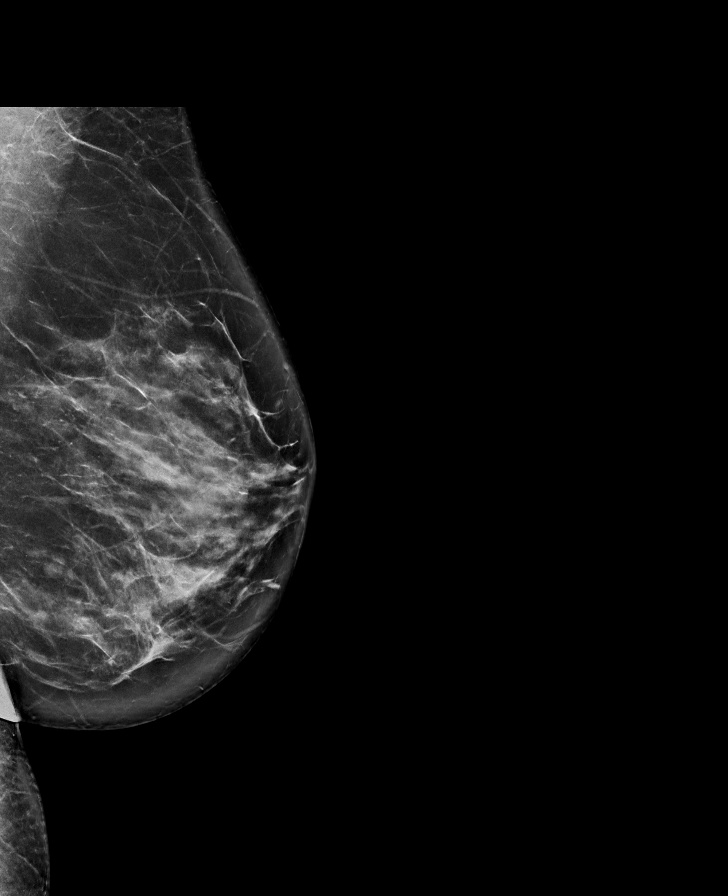

[L CC synth-2D]
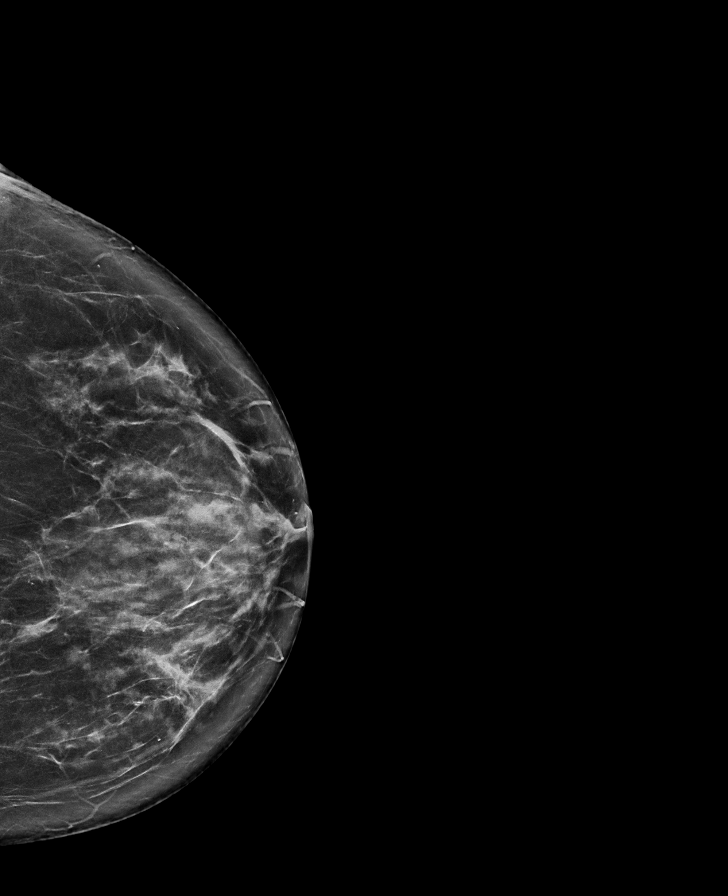

[R MLO synth-2D]
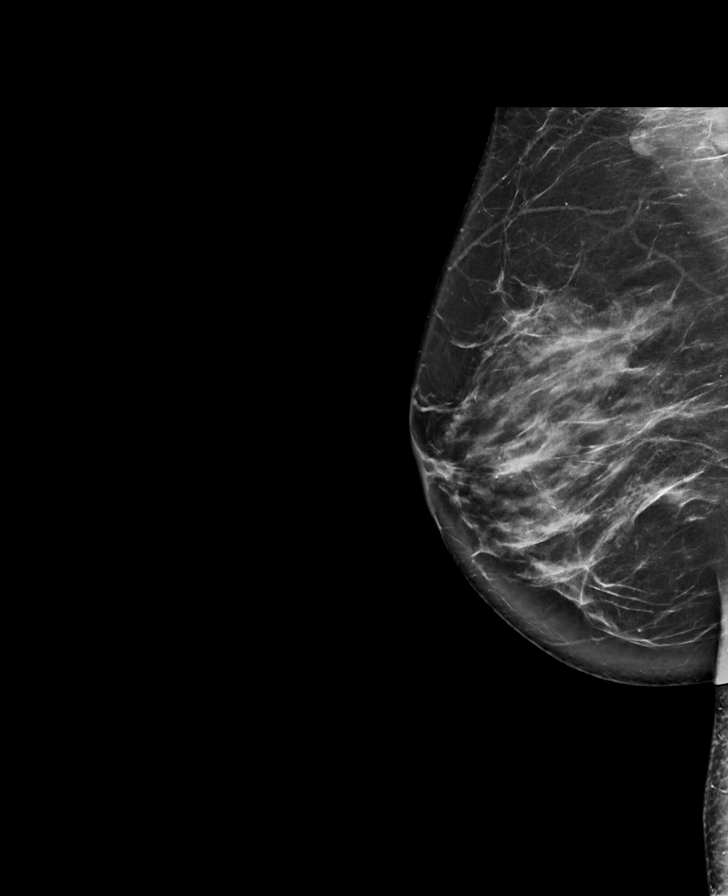

[L MLO tomo · tomo slice 46/91.0]
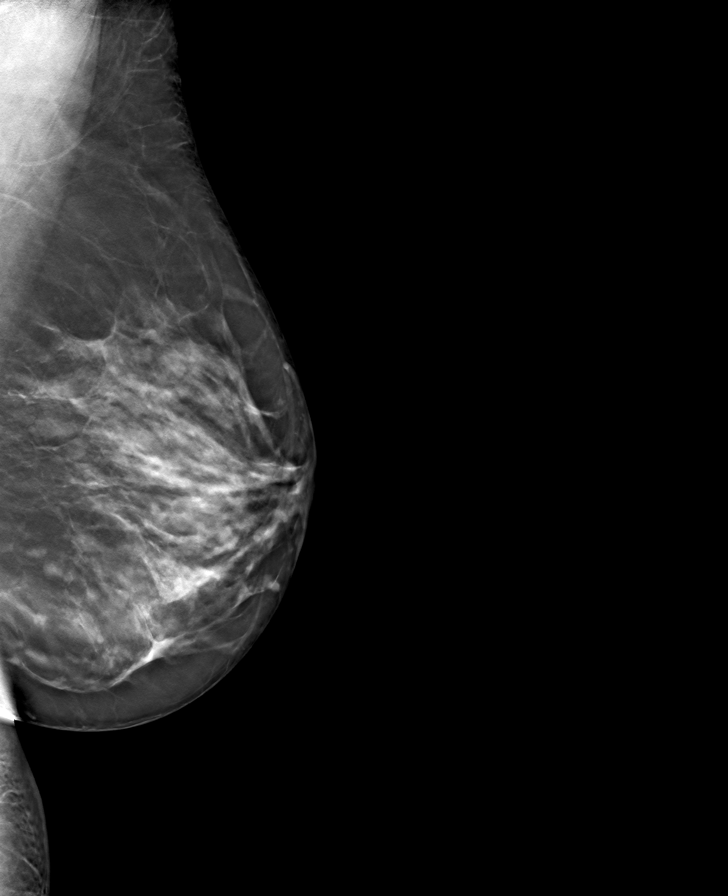

[L CC tomo · tomo slice 43/85.0]
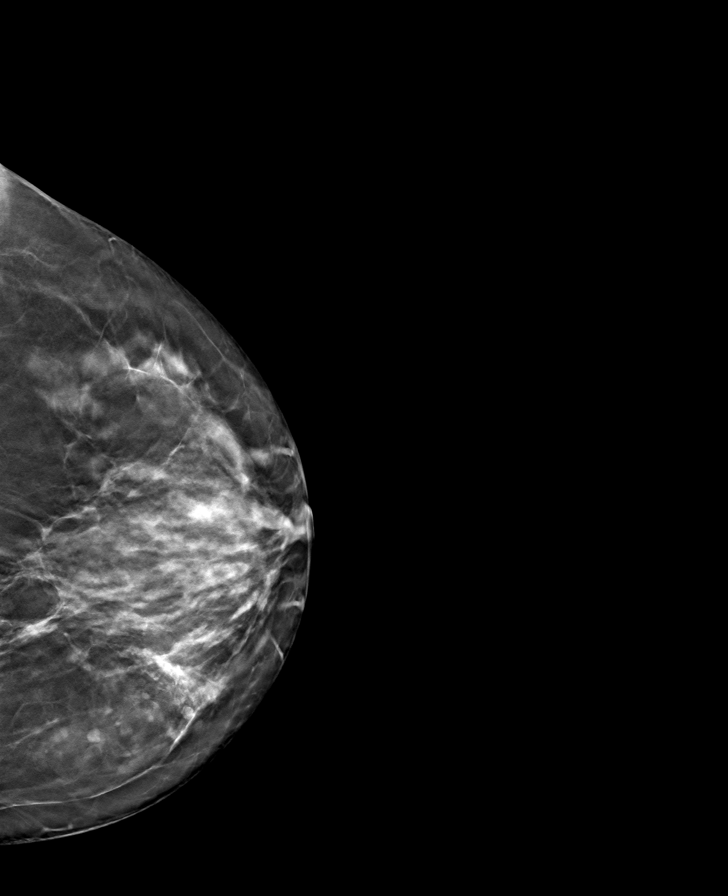

[R MLO tomo · tomo slice 47/92.0]
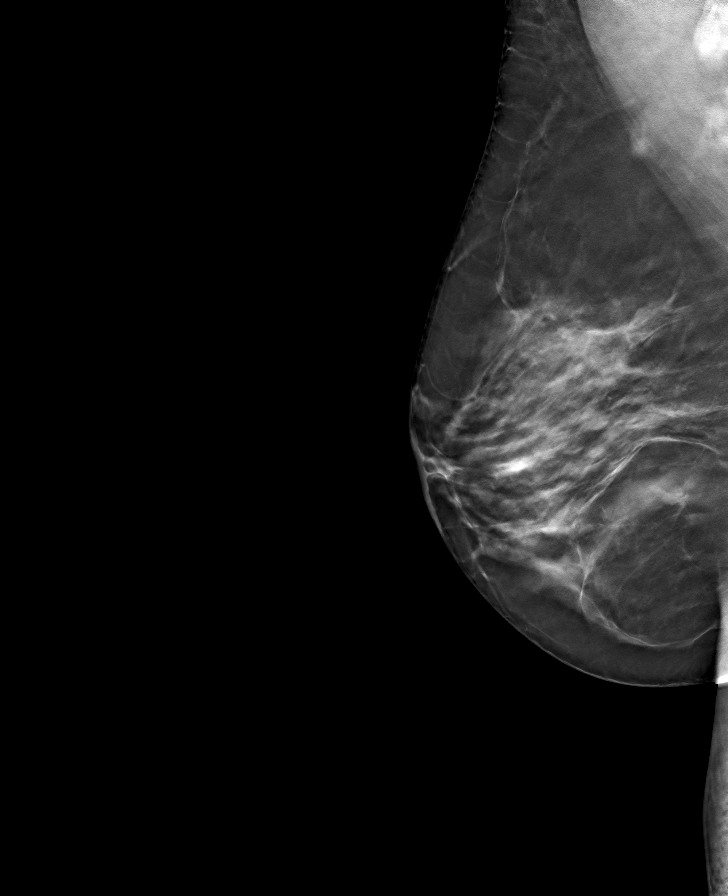

[R CC tomo · tomo slice 44/87.0]
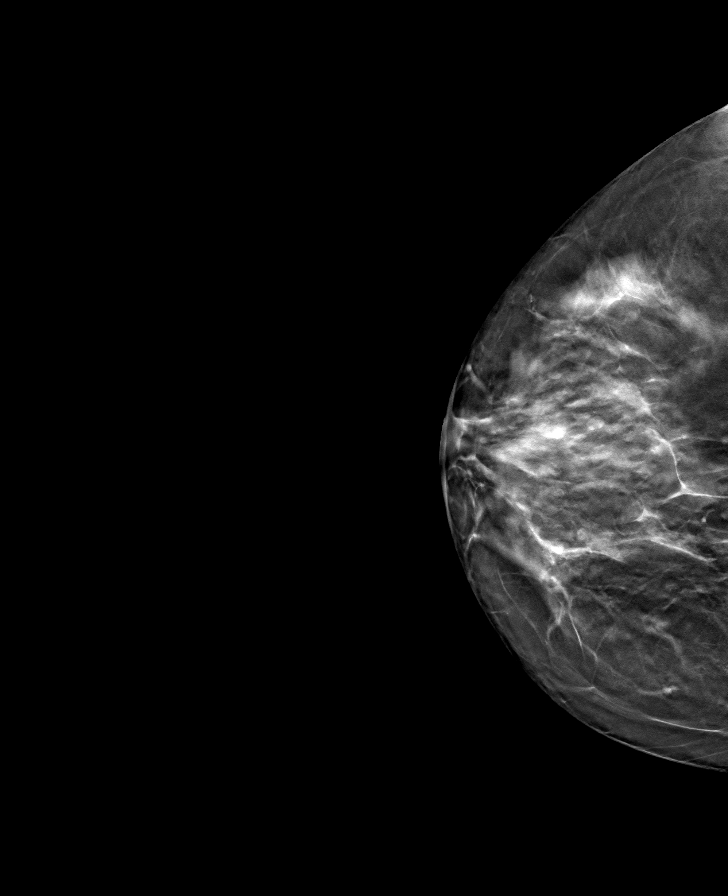

[8 of 24 positions shown; findings below may reference images not displayed]

ACR Breast Density Category c: The breast tissue is heterogeneously
dense, which may obscure small masses.
FINDINGS: There are no findings suspicious for malignancy.
IMPRESSION: No mammographic evidence of malignancy. A result letter of this
screening mammogram will be mailed directly to the patient.

RECOMMENDATION:
Screening mammogram in one year. (Code:Q3-W-BC3)

BI-RADS CATEGORY  1: Negative.

## 2022-12-20 ENCOUNTER — Other Ambulatory Visit (HOSPITAL_COMMUNITY): Payer: Self-pay

## 2023-01-17 ENCOUNTER — Ambulatory Visit: Payer: Self-pay | Admitting: *Deleted

## 2023-01-17 ENCOUNTER — Ambulatory Visit: Payer: Commercial Managed Care - PPO | Admitting: Internal Medicine

## 2023-01-17 MED ORDER — NIRMATRELVIR/RITONAVIR (PAXLOVID)TABLET
3.0000 | ORAL_TABLET | Freq: Two times a day (BID) | ORAL | 0 refills | Status: AC
  Filled 2023-01-17: qty 30, 5d supply, fill #0

## 2023-01-17 NOTE — Telephone Encounter (Signed)
Summary: Covid positive, seeking Rx   Pt has tested positive for Covid, she is requesting Paxlovid. Seeking assistance  Best contact: [phone]           Chief Complaint: covid positive at home test this am. Husband just in office Thursday treated for covid requesting medication  Symptoms: dry cough, nasal congestion difficulty breathing through nose. Chills. Hx prediabetes  Frequency: sx started last Thursday  Pertinent Negatives: Patient denies chest pain no difficulty breathing no fever now  Disposition: [] ED /[] Urgent Care (no appt availability in office) / [] Appointment(In office/virtual)/ []  [facility]/ [] Home Care/ [] Refused Recommended Disposition /[] [facility]/ [x]  Follow-up with PCP Additional Notes:   Patient had appt scheduled for today and cancelled thinking she should not come in due to covid positive at home test. Unaware she could request to change to VV.  Reports husband came in to see PCP Thursday and treated for covid. Patient requesting medication . Recommended UC VV. Patient would like to know if PCP can prescribe for same medication she gave husband. Recommended if sx worsen go to UC /ED. Please advise .       Reason for Disposition  [1] HIGH RISK patient (e.g., weak immune system, age > 64 years, obesity with BMI 30 or higher, pregnant, chronic lung disease or other chronic medical condition) AND [2] COVID symptoms (e.g., cough, fever)  (Exceptions: Already seen by PCP and no new or worsening symptoms.)  Answer Assessment - Initial Assessment Questions 1. COVID-19 DIAGNOSIS: "How do you know that you have COVID?" (e.g., positive lab test or self-test, diagnosed by doctor or NP/PA, symptoms after exposure).     At home covid test  2. COVID-19 EXPOSURE: "Was there any known exposure to COVID before the symptoms began?" CDC Definition of close contact: within 6 feet (2 meters) for a total of 15 minutes or more over a 24-hour period.       Husband tested positive  3. ONSET: "When did the COVID-19 symptoms start?"      Thursday  4. WORST SYMPTOM: "What is your worst symptom?" (e.g., cough, fever, shortness of breath, muscle aches)     Dry cough , nasal congestion , difficulty  5. COUGH: "Do you have a cough?" If Yes, ask: "How bad is the cough?"       Yes dry cough  6. FEVER: "Do you have a fever?" If Yes, ask: "What is your temperature, how was it measured, and when did it start?"     Chills  7. RESPIRATORY STATUS: "Describe your breathing?" (e.g., normal; shortness of breath, wheezing, unable to speak)      Can't breathe through nose  8. BETTER-SAME-WORSE: "Are you getting better, staying the same or getting worse compared to yesterday?"  If getting worse, ask, "In what way?"     Worse  9. OTHER SYMPTOMS: "Do you have any other symptoms?"  (e.g., chills, fatigue, headache, loss of smell or taste, muscle pain, sore throat)     Chills, headache nasal congestion, cough non productive ,  10. HIGH RISK DISEASE: "Do you have any chronic medical problems?" (e.g., asthma, heart or lung disease, weak immune system, obesity, etc.)       Prediabetic  11. VACCINE: "Have you had the COVID-19 vaccine?" If Yes, ask: "Which one, how many shots, when did you get it?"       na 12. PREGNANCY: "Is there any chance you are pregnant?" "When was your last menstrual period?"  na 13. O2 SATURATION MONITOR:  "Do you use an oxygen saturation monitor (pulse oximeter) at home?" If Yes, ask "What is your reading (oxygen level) today?" "What is your usual oxygen saturation reading?" (e.g., 95%)       na  Protocols used: Coronavirus (COVID-19) Diagnosed or Suspected-A-AH

## 2023-01-18 ENCOUNTER — Other Ambulatory Visit (HOSPITAL_COMMUNITY): Payer: Self-pay

## 2023-01-18 NOTE — Telephone Encounter (Addendum)
Spoke with patient . Verified name & DOB  Patient advised Prescription sent to [facility] pharmacy for Paxlovid for her to take twice a day for 5 days. Advised that The medication can cause some diarrhea and change in taste.

## 2023-01-31 ENCOUNTER — Other Ambulatory Visit (HOSPITAL_COMMUNITY): Payer: Self-pay

## 2023-01-31 ENCOUNTER — Ambulatory Visit: Payer: Commercial Managed Care - PPO | Attending: Internal Medicine | Admitting: Physician Assistant

## 2023-01-31 ENCOUNTER — Encounter: Payer: Self-pay | Admitting: Physician Assistant

## 2023-01-31 VITALS — BP 122/75 | HR 71 | Wt 181.6 lb

## 2023-01-31 DIAGNOSIS — R7303 Prediabetes: Secondary | ICD-10-CM

## 2023-01-31 DIAGNOSIS — I10 Essential (primary) hypertension: Secondary | ICD-10-CM | POA: Diagnosis not present

## 2023-01-31 DIAGNOSIS — U071 COVID-19: Secondary | ICD-10-CM | POA: Diagnosis not present

## 2023-01-31 MED ORDER — AMLODIPINE BESYLATE 10 MG PO TABS
10.0000 mg | ORAL_TABLET | Freq: Every day | ORAL | 1 refills | Status: DC
Start: 2023-01-31 — End: 2023-06-02
  Filled 2023-01-31: qty 90, 90d supply, fill #0

## 2023-01-31 MED ORDER — METFORMIN HCL 500 MG PO TABS
500.0000 mg | ORAL_TABLET | Freq: Two times a day (BID) | ORAL | 1 refills | Status: DC
  Filled 2023-01-31: qty 180, 90d supply, fill #0

## 2023-01-31 MED ORDER — VALSARTAN 40 MG PO TABS
40.0000 mg | ORAL_TABLET | Freq: Every day | ORAL | 1 refills | Status: DC
Start: 2023-01-31 — End: 2023-06-02
  Filled 2023-01-31: qty 90, 90d supply, fill #0

## 2023-01-31 NOTE — Progress Notes (Signed)
[name], female   DOB: 10-01-1965, 57 y.o.   MRN: [id number]   Shelby Wyatt, is a 57 y.o. female  [id number]  [id number]  DOB - [birth year]  Chief Complaint  Patient presents with   Medication Refill       Subjective:   Shelby Wyatt is a 57 y.o. female here today for a follow up visit after having covid 2 weeks ago.  She is doing well.  Has mild congestion but no wheezing/SOB.  No fevers.  Needs med RF  No problems updated.  ALLERGIES: Allergies  Allergen Reactions   Augmentin [Amoxicillin-Pot Clavulanate] Other (See Comments)    Significant abdominal pain, diarrhea, following Augmentin specifically (not penicillin allergy)   Lisinopril Cough   Terbinafine And Related Hives    PAST MEDICAL HISTORY: Past Medical History:  Diagnosis Date   Allergy    seasonal   Anemia    Cancer (HCC)    colon, cancer dec 2022   Hypertension    No pertinent past medical history    Pre-diabetes    Tubal pregnancy     MEDICATIONS AT HOME: Prior to Admission medications   Medication Sig Start Date End Date Taking? Authorizing Provider  miconazole (MICATIN) 2 % cream Apply 1 Application topically 2 (two) times daily. 09/17/22  Yes Marcine Matar, MD  Multiple Vitamins-Minerals (MULTIVITAMIN WITH MINERALS) tablet Take 1 tablet by mouth daily. Centrum 50+   Yes [provider]  naproxen sodium (ALEVE) 220 MG tablet Take 2 tablets (440 mg total) by mouth daily as needed (cramps). 04/21/21  Yes Mansouraty, Netty Starring., MD  nicotine (NICODERM CQ - DOSED IN MG/24 HOURS) 14 mg/24hr patch Place 1 patch (14 mg total) onto the skin daily. 04/02/21  Yes Marcine Matar, MD  nicotine polacrilex (NICORETTE) 2 MG gum Take 1 each (2 mg total) by mouth as needed for smoking cessation. 01/12/22  Yes Marcine Matar, MD  amLODipine (NORVASC) 10 MG tablet Take 1 tablet (10 mg total) by mouth daily. 01/31/23   Anders Simmonds, PA-C  metFORMIN (GLUCOPHAGE)  500 MG tablet Take 1 tablet (500 mg total) by mouth 2 (two) times daily with a meal. 01/31/23   Sherald Balbuena, Marzella Schlein, PA-C  ondansetron (ZOFRAN-ODT) 4 MG disintegrating tablet Take 1 tablet (4 mg total) by mouth every 8 (eight) hours as needed for nausea or vomiting for up to 12 doses Patient not taking: Reported on 10/20/2022 09/24/22   Terald Sleeper, MD  senna-docusate (SENOKOT-S) 8.6-50 MG tablet Take 1 tablet by mouth daily as needed for mild constipation. Patient not taking: Reported on 10/20/2022 09/24/22   Terald Sleeper, MD  valsartan (DIOVAN) 40 MG tablet Take 1 tablet (40 mg total) by mouth daily. 01/31/23   Krislynn Gronau, Marzella Schlein, PA-C    ROS: Neg HEENT Neg resp Neg cardiac Neg GI Neg GU Neg MS Neg psych Neg neuro  Objective:   Vitals:   01/31/23 1649  BP: 122/75  Pulse: 71  SpO2: 98%  Weight: 181 lb 9.6 oz (82.4 kg)   Exam General appearance : Awake, alert, not in any distress. Speech Clear. Not toxic looking HEENT: Atraumatic and Normocephalic Neck: Supple, no JVD. No cervical lymphadenopathy.  Chest: Good air entry bilaterally, CTAB.  No rales/rhonchi/wheezing CVS: S1 S2 regular, no murmurs.  Extremities: B/L Lower Ext shows no edema, both legs are warm to touch Neurology: Awake alert, and oriented X 3, CN II-XII intact, Non focal Skin: No Rash  Data  Review Lab Results  Component Value Date   HGBA1C 6.1 10/20/2022   HGBA1C 6.4 05/18/2022   HGBA1C 6.4 (A) 10/12/2021    Assessment & Plan   1. Essential hypertension Controlled-reviewed labs from  - amLODipine (NORVASC) 10 MG tablet; Take 1 tablet (10 mg total) by mouth daily.  Dispense: 90 tablet; Refill: 1 - valsartan (DIOVAN) 40 MG tablet; Take 1 tablet (40 mg total) by mouth daily.  Dispense: 90 tablet; Refill: 1  2. Prediabetes controlled - metFORMIN (GLUCOPHAGE) 500 MG tablet; Take 1 tablet (500 mg total) by mouth 2 (two) times daily with a meal.  Dispense: 180 tablet; Refill: 1  3. COVID Doing well.   Positive test was 2 weeks ago    Return in about 4 months (around 06/02/2023) for Dr Laural Benes for chronic conditions.  The patient was given clear instructions to go to ER or return to medical center if symptoms don't improve, worsen or new problems develop. The patient verbalized understanding. The patient was told to call to get lab results if they haven't heard anything in the next week.      Georgian Co, PA-C [facility] [place], [place] [phone]   01/31/2023, 5:29 PM

## 2023-02-02 ENCOUNTER — Encounter: Payer: Self-pay | Admitting: Podiatry

## 2023-02-02 ENCOUNTER — Ambulatory Visit: Payer: Commercial Managed Care - PPO | Admitting: Podiatry

## 2023-02-02 DIAGNOSIS — R7303 Prediabetes: Secondary | ICD-10-CM | POA: Diagnosis not present

## 2023-02-02 DIAGNOSIS — M79675 Pain in left toe(s): Secondary | ICD-10-CM | POA: Diagnosis not present

## 2023-02-02 DIAGNOSIS — Q828 Other specified congenital malformations of skin: Secondary | ICD-10-CM | POA: Diagnosis not present

## 2023-02-02 DIAGNOSIS — M79674 Pain in right toe(s): Secondary | ICD-10-CM

## 2023-02-02 DIAGNOSIS — B351 Tinea unguium: Secondary | ICD-10-CM

## 2023-02-02 DIAGNOSIS — M79671 Pain in right foot: Secondary | ICD-10-CM | POA: Diagnosis not present

## 2023-02-02 DIAGNOSIS — M79672 Pain in left foot: Secondary | ICD-10-CM | POA: Diagnosis not present

## 2023-02-10 NOTE — Progress Notes (Signed)
  Subjective:  Patient ID: Warren Lacy, female    DOB: January 26, 1966,  MRN: [id number]  Warren Lacy presents to clinic today for painful porokeratotic lesion(s) of both feet and painful mycotic toenails that limit ambulation. Painful toenails interfere with ambulation. Aggravating factors include wearing enclosed shoe gear. Pain is relieved with periodic professional debridement. Painful porokeratotic lesions are aggravated when weightbearing with and without shoegear. Pain is relieved with periodic professional debridement.   New problem(s): None.   PCP is Marcine Matar, MD. LOV was 09/21/2022.  Allergies  Allergen Reactions   Augmentin [Amoxicillin-Pot Clavulanate] Other (See Comments)    Significant abdominal pain, diarrhea, following Augmentin specifically (not penicillin allergy)   Lisinopril Cough   Terbinafine And Related Hives    Review of Systems: Negative except as noted in the HPI.  Objective: No changes noted in today's physical examination. There were no vitals filed for this visit. ARLETT MAIORINO is a pleasant 57 y.o. female in NAD. AAO x 3.  Vascular Examination: Capillary refill time immediate b/l. Vascular status intact b/l with palpable pedal pulses. Pedal hair present b/l. No edema. No pain with calf compression b/l. Skin temperature gradient WNL b/l. No ischemia or gangrene noted b/l LE. No cyanosis or clubbing noted b/l LE.  Neurological Examination: Sensation grossly intact b/l with 10 gram monofilament. Vibratory sensation intact b/l.   Dermatological Examination: Pedal skin with normal turgor, texture and tone b/l.  No open wounds. No interdigital macerations.   Toenails 1-5 b/l thick, discolored, elongated with subungual debris and pain on dorsal palpation.   Porokeratotic lesion(s) plantar midfoot b/l. No erythema, no edema, no drainage, no fluctuance.  Musculoskeletal Examination: Normal muscle strength 5/5 to all lower extremity  muscle groups bilaterally. No pain, crepitus or joint limitation noted with ROM b/l LE. No gross bony pedal deformities b/l. Patient ambulates independently without assistive aids.  Radiographs: None  Lab Results  Component Value Date   HGBA1C 6.1 10/20/2022   Assessment/Plan: 1. Pain due to onychomycosis of toenails of both feet   2. Porokeratosis   3. Pain in both feet   4. Prediabetes   -Consent given for treatment as described below: -Examined patient. -Patient to continue soft, supportive shoe gear daily. -Mycotic toenails 1-5 bilaterally were debrided in length and girth with sterile nail nippers and dremel without incident. -Porokeratotic lesion(s) plantarlateral aspect of midfoot b/l pared and enucleated with sterile currette without incident. Total number of lesions debrided=2. -Patient/POA to call should there be question/concern in the interim.   Return in about 3 months (around 05/05/2023).  Freddie Breech, DPM

## 2023-05-11 ENCOUNTER — Ambulatory Visit: Payer: Commercial Managed Care - PPO | Admitting: Podiatry

## 2023-05-23 ENCOUNTER — Ambulatory Visit: Payer: Commercial Managed Care - PPO | Admitting: Podiatry

## 2023-06-02 ENCOUNTER — Other Ambulatory Visit (HOSPITAL_COMMUNITY): Payer: Self-pay

## 2023-06-02 ENCOUNTER — Ambulatory Visit: Payer: Commercial Managed Care - PPO | Attending: Internal Medicine | Admitting: Internal Medicine

## 2023-06-02 ENCOUNTER — Encounter: Payer: Self-pay | Admitting: Internal Medicine

## 2023-06-02 VITALS — BP 121/71 | HR 76 | Temp 98.4°F | Ht 62.0 in | Wt 181.0 lb

## 2023-06-02 DIAGNOSIS — Z1231 Encounter for screening mammogram for malignant neoplasm of breast: Secondary | ICD-10-CM

## 2023-06-02 DIAGNOSIS — F17200 Nicotine dependence, unspecified, uncomplicated: Secondary | ICD-10-CM | POA: Diagnosis not present

## 2023-06-02 DIAGNOSIS — L02429 Furuncle of limb, unspecified: Secondary | ICD-10-CM | POA: Diagnosis not present

## 2023-06-02 DIAGNOSIS — Z6833 Body mass index (BMI) 33.0-33.9, adult: Secondary | ICD-10-CM | POA: Diagnosis not present

## 2023-06-02 DIAGNOSIS — R7303 Prediabetes: Secondary | ICD-10-CM | POA: Diagnosis not present

## 2023-06-02 DIAGNOSIS — I10 Essential (primary) hypertension: Secondary | ICD-10-CM | POA: Diagnosis not present

## 2023-06-02 DIAGNOSIS — E66811 Obesity, class 1: Secondary | ICD-10-CM | POA: Diagnosis not present

## 2023-06-02 DIAGNOSIS — E6609 Other obesity due to excess calories: Secondary | ICD-10-CM

## 2023-06-02 MED ORDER — AMLODIPINE BESYLATE 10 MG PO TABS
10.0000 mg | ORAL_TABLET | Freq: Every day | ORAL | 1 refills | Status: DC
Start: 2023-06-02 — End: 2023-12-15
  Filled 2023-06-02: qty 90, 90d supply, fill #0
  Filled 2023-09-16: qty 90, 90d supply, fill #1

## 2023-06-02 MED ORDER — METFORMIN HCL 500 MG PO TABS
500.0000 mg | ORAL_TABLET | Freq: Two times a day (BID) | ORAL | 1 refills | Status: DC
Start: 2023-06-02 — End: 2023-12-15
  Filled 2023-06-02: qty 180, 90d supply, fill #0
  Filled 2023-09-16: qty 180, 90d supply, fill #1

## 2023-06-02 MED ORDER — NICOTINE 14 MG/24HR TD PT24
14.0000 mg | MEDICATED_PATCH | Freq: Every day | TRANSDERMAL | 2 refills | Status: AC
  Filled 2023-06-02: qty 28, 28d supply, fill #0
  Filled 2023-09-16: qty 28, 28d supply, fill #1
  Filled 2024-04-14: qty 28, 28d supply, fill #2

## 2023-06-02 MED ORDER — VALSARTAN 40 MG PO TABS
40.0000 mg | ORAL_TABLET | Freq: Every day | ORAL | 1 refills | Status: DC
  Filled 2023-06-02: qty 90, 90d supply, fill #0
  Filled 2023-09-16: qty 90, 90d supply, fill #1

## 2023-06-02 MED ORDER — SULFAMETHOXAZOLE-TRIMETHOPRIM 800-160 MG PO TABS
1.0000 | ORAL_TABLET | Freq: Two times a day (BID) | ORAL | 0 refills | Status: AC
Start: 2023-06-02 — End: 2023-06-10
  Filled 2023-06-02: qty 14, 7d supply, fill #0

## 2023-06-02 NOTE — Progress Notes (Signed)
[name], female    DOB: [birth year]  MRN: [id number]  CC: Hypertension (HTN f/u. Med refill. Leveda Anna boil on R leg X2 weeks/Yes to pap for another appt. )   Subjective: Shelby Wyatt is a 57 y.o. female who presents for chronic ds management. Her concerns today include:  Tob dep, fecal incont, HTN, preDM, obesity, colon CA (s/p RT hemicolectomy 05/2021),   Discussed the use of AI scribe software for clinical note transcription with the patient, who gave verbal consent to proceed.  History of Present Illness   The patient, with a history of hypertension and prediabetes, presents for a routine follow-up.  HTN:  She reports excellent blood pressure control on valsartan 40mg  daily and amlodipine 10mg  daily, which she confirms by self-monitoring twice weekly. She denies any chest pain or shortness of breath.  Tob dep:  The patient is actively trying to quit smoking, currently smoking about six cigarettes daily, down from a previous higher amount. She reports using nicotine patches (14mg ) on days when she does not smoke and requests a refill.  Obesity/PreDM: weight has remained stable at 181 pounds over the past several months. She reports some physical activity, including walking at work and walking her dog at home, but acknowledges that the duration and intensity of these activities are limited. Her eating habits are inconsistent, with some healthy choices like salads but also indulgences in high-calorie foods like pizza and burgers. She continues to take metformin for prediabetes management.  The patient also presents with a two-week-old bump on her right inner thigh, suspected to be a small boil. She denies any drainage from the bump.  It has decreased in size since it started 2 weeks ago.      Patient Active Problem List   Diagnosis Date Noted   Numbness of fingers of both hands 10/12/2021   Glaucoma suspect 05/27/2021   Serrated polyp of colon 04/02/2021    Hx of adenomatous colonic polyps 04/02/2021   Cecal polyp 04/02/2021   Abnormal colonoscopy 04/02/2021   Cough due to ACE inhibitor 03/05/2021   Obesity (BMI 30-39.9) 03/05/2021   Essential hypertension 12/25/2020   Prediabetes 12/25/2020   Perimenopausal 12/25/2020   Tobacco dependence 12/01/2020   Incontinence of feces 12/01/2020   Abdominal pain 12/01/2020     Current Outpatient Medications on File Prior to Visit  Medication Sig Dispense Refill   miconazole (MICATIN) 2 % cream Apply 1 Application topically 2 (two) times daily. 30 g 0   Multiple Vitamins-Minerals (MULTIVITAMIN WITH MINERALS) tablet Take 1 tablet by mouth daily. Centrum 50+     naproxen sodium (ALEVE) 220 MG tablet Take 2 tablets (440 mg total) by mouth daily as needed (cramps). 30 tablet 0   No current facility-administered medications on file prior to visit.    Allergies  Allergen Reactions   Augmentin [Amoxicillin-Pot Clavulanate] Other (See Comments)    Significant abdominal pain, diarrhea, following Augmentin specifically (not penicillin allergy)   Lisinopril Cough   Terbinafine And Related Hives    Social History   Socioeconomic History   Marital status: Married    Spouse name: Not on file   Number of children: 0   Years of education: Not on file   Highest education level: Associate degree: occupational, Scientist, product/process development, or vocational program  Occupational History   Occupation: Food services - [employer]  Tobacco Use   Smoking status: Every Day    Current packs/day: 0.25    Types: Cigarettes   Smokeless tobacco:  Never  Vaping Use   Vaping status: Never Used  Substance and Sexual Activity   Alcohol use: Yes    Comment: occasionally   Drug use: Yes    Types: Marijuana    Comment: Smoked at the beginning of month   Sexual activity: Yes    Birth control/protection: None  Other Topics Concern   Not on file  Social History Narrative   Not on file   Social Drivers of Health   Financial Resource  Strain: Low Risk  (05/31/2023)   Overall Financial Resource Strain (CARDIA)    Difficulty of Paying Living Expenses: Not very hard  Food Insecurity: No Food Insecurity (05/31/2023)   Hunger Vital Sign    Worried About Running Out of Food in the Last Year: Never true    Ran Out of Food in the Last Year: Never true  Transportation Needs: No Transportation Needs (05/31/2023)   PRAPARE - Administrator, Civil Service (Medical): No    Lack of Transportation (Non-Medical): No  Physical Activity: Insufficiently Active (05/31/2023)   Exercise Vital Sign    Days of Exercise per Week: 2 days    Minutes of Exercise per Session: 10 min  Stress: No Stress Concern Present (05/31/2023)   [employer] of Occupational Health - Occupational Stress Questionnaire    Feeling of Stress : Not at all  Social Connections: Moderately Integrated (05/31/2023)   Social Connection and Isolation Panel [NHANES]    Frequency of Communication with Friends and Family: More than three times a week    Frequency of Social Gatherings with Friends and Family: Three times a week    Attends Religious Services: 1 to 4 times per year    Active Member of Clubs or Organizations: No    Attends Engineer, structural: Not on file    Marital Status: Married  Catering manager Violence: Not on file    Family History  Problem Relation Age of Onset   Hypertension Mother    Diabetes Mother    Breast cancer Mother    Hypertension Maternal Grandmother    Esophageal cancer Paternal Grandmother    Hypertension Paternal Grandmother    Hypertension Paternal Grandfather    Colon cancer Neg Hx    Rectal cancer Neg Hx    Stomach cancer Neg Hx    Inflammatory bowel disease Neg Hx    Liver disease Neg Hx    Pancreatic cancer Neg Hx    Colon polyps Neg Hx    Crohn's disease Neg Hx    Ulcerative colitis Neg Hx     Past Surgical History:  Procedure Laterality Date   COLONOSCOPY     COLONOSCOPY WITH PROPOFOL  N/A 04/07/2021   Procedure: COLONOSCOPY WITH PROPOFOL;  Surgeon: Mansouraty, Netty Starring., MD;  Location: [facility];  Service: Gastroenterology;  Laterality: N/A;   ENDOSCOPIC MUCOSAL RESECTION N/A 04/07/2021   Procedure: ENDOSCOPIC MUCOSAL RESECTION;  Surgeon: Meridee Score Netty Starring., MD;  Location: [facility];  Service: Gastroenterology;  Laterality: N/A;   HEMOSTASIS CLIP PLACEMENT  04/07/2021   Procedure: HEMOSTASIS CLIP PLACEMENT;  Surgeon: Lemar Lofty., MD;  Location: [facility];  Service: Gastroenterology;;   LAPAROSCOPIC RIGHT HEMI COLECTOMY Right 05/27/2021   Procedure: LAPAROSCOPIC RIGHT HEMI COLECTOMY;  Surgeon: Andria Meuse, MD;  Location: [facility];  Service: General;  Laterality: Right;   laporoscopy     MOUTH SURGERY     SUBMUCOSAL LIFTING INJECTION  04/07/2021   Procedure: SUBMUCOSAL LIFTING INJECTION;  Surgeon: Meridee Score,  Netty Starring., MD;  Location: [facility];  Service: Gastroenterology;;   TUBAL LIGATION     WISDOM TOOTH EXTRACTION      ROS: Review of Systems Negative except as stated above  PHYSICAL EXAM: BP 121/71 (BP Location: Left Arm, Patient Position: Sitting, Cuff Size: Normal)   Pulse 76   Temp 98.4 F (36.9 C) (Oral)   Ht 5\' 2"  (1.575 m)   Wt 181 lb (82.1 kg)   SpO2 99%   BMI 33.11 kg/m   Wt Readings from Last 3 Encounters:  06/02/23 181 lb (82.1 kg)  01/31/23 181 lb 9.6 oz (82.4 kg)  10/20/22 181 lb 9.6 oz (82.4 kg)    Physical Exam  General appearance - alert, well appearing, and in no distress Mental status - normal mood, behavior, speech, dress, motor activity, and thought processes Neck - supple, no significant adenopathy Chest - clear to auscultation, no wheezes, rales or rhonchi, symmetric air entry Heart - normal rate, regular rhythm, normal S1, S2, no murmurs, rubs, clicks or gallops Extremities - peripheral pulses normal, no pedal edema, no clubbing or cyanosis Skin -right upper inner thigh: She has a small  raised bump with some peeling of the skin around it.  No fluctuance appreciated or induration.  No drainage expressed.      Latest Ref Rng & Units 09/24/2022    9:32 AM 05/24/2022    4:29 PM 05/30/2021    5:11 AM  CMP  Glucose 70 - 99 mg/dL 846  88  962   BUN 6 - 20 mg/dL 10  12  11    Creatinine 0.44 - 1.00 mg/dL 9.52  8.41  3.24   Sodium 135 - 145 mmol/L 138  136  134   Potassium 3.5 - 5.1 mmol/L 3.9  4.2  4.1   Chloride 98 - 111 mmol/L 108  102  99   CO2 22 - 32 mmol/L 23  22  26    Calcium 8.9 - 10.3 mg/dL 8.9  9.7  9.0   Total Protein 6.5 - 8.1 g/dL 7.4  7.3    Total Bilirubin 0.3 - 1.2 mg/dL 0.7  <4.0    Alkaline Phos 38 - 126 U/L 73  89    AST 15 - 41 U/L 19  12    ALT 0 - 44 U/L 17  14     Lipid Panel     Component Value Date/Time   CHOL 153 05/24/2022 1629   TRIG 119 05/24/2022 1629   HDL 45 05/24/2022 1629   CHOLHDL 3.4 05/24/2022 1629   LDLCALC 87 05/24/2022 1629    CBC    Component Value Date/Time   WBC 6.7 09/24/2022 0932   RBC 4.79 09/24/2022 0932   HGB 13.6 09/24/2022 0932   HGB 13.0 05/24/2022 1629   HCT 39.9 09/24/2022 0932   HCT 37.9 05/24/2022 1629   PLT 245 09/24/2022 0932   PLT 305 05/24/2022 1629   MCV 83.3 09/24/2022 0932   MCV 82 05/24/2022 1629   MCH 28.4 09/24/2022 0932   MCHC 34.1 09/24/2022 0932   RDW 15.4 09/24/2022 0932   RDW 14.0 05/24/2022 1629    ASSESSMENT AND PLAN: 1. Essential hypertension (Primary) Well controlled on Valsartan 40mg  daily and Amlodipine 10mg  daily. . -Continue current medications. -Continue monitoring blood pressure at home twice weekly. - amLODipine (NORVASC) 10 MG tablet; Take 1 tablet (10 mg total) by mouth daily.  Dispense: 90 tablet; Refill: 1 - valsartan (DIOVAN) 40 MG tablet; Take 1  tablet (40 mg total) by mouth daily.  Dispense: 90 tablet; Refill: 1 - Lipid panel - Comprehensive metabolic panel  2. Class 1 obesity due to excess calories with serious comorbidity and body mass index (BMI) of 33.0 to  33.9 in adult 3. Prediabetes -Continue Metformin. -Encouraged to increase exercise and be mindful of diet, especially during holiday season. - metFORMIN (GLUCOPHAGE) 500 MG tablet; Take 1 tablet (500 mg total) by mouth 2 (two) times daily with a meal.  Dispense: 180 tablet; Refill: 1 - Hemoglobin A1c - Lipid panel  4. Tobacco dependence Continue to encourage her with her efforts to quit smoking. -Refill nicotine patches (14mg ). - nicotine (NICODERM CQ - DOSED IN MG/24 HOURS) 14 mg/24hr patch; Place 1 patch (14 mg total) onto the skin daily.  Dispense: 28 patch; Refill: 2  5. Boil, thigh Recommend warm compresses. - sulfamethoxazole-trimethoprim (BACTRIM DS) 800-160 MG tablet; Take 1 tablet by mouth 2 (two) times daily for 7 days.  Dispense: 14 tablet; Refill: 0  6. Encounter for screening mammogram for malignant neoplasm of breast - MM Digital Screening; Future  Patient was given the opportunity to ask questions.  Patient verbalized understanding of the plan and was able to repeat key elements of the plan.   This documentation was completed using Paediatric nurse.  Any transcriptional errors are unintentional.  Orders Placed This Encounter  Procedures   MM Digital Screening   Hemoglobin A1c   Lipid panel   Comprehensive metabolic panel     Requested Prescriptions   Signed Prescriptions Disp Refills   amLODipine (NORVASC) 10 MG tablet 90 tablet 1    Sig: Take 1 tablet (10 mg total) by mouth daily.   metFORMIN (GLUCOPHAGE) 500 MG tablet 180 tablet 1    Sig: Take 1 tablet (500 mg total) by mouth 2 (two) times daily with a meal.   valsartan (DIOVAN) 40 MG tablet 90 tablet 1    Sig: Take 1 tablet (40 mg total) by mouth daily.   nicotine (NICODERM CQ - DOSED IN MG/24 HOURS) 14 mg/24hr patch 28 patch 2    Sig: Place 1 patch (14 mg total) onto the skin daily.   sulfamethoxazole-trimethoprim (BACTRIM DS) 800-160 MG tablet 14 tablet 0    Sig: Take 1 tablet by  mouth 2 (two) times daily for 7 days.    Return in about 4 months (around 10/01/2023).  Jonah Blue, MD, FACP

## 2023-06-03 ENCOUNTER — Other Ambulatory Visit: Payer: Self-pay

## 2023-06-03 LAB — LIPID PANEL
Chol/HDL Ratio: 3.2 {ratio} (ref 0.0–4.4)
Cholesterol, Total: 159 mg/dL (ref 100–199)
HDL: 50 mg/dL (ref >39)
LDL Chol Calc (NIH): 91 mg/dL (ref 0–99)
Triglycerides: 100 mg/dL (ref 0–149)
VLDL Cholesterol Cal: 18 mg/dL (ref 5–40)

## 2023-06-03 LAB — COMPREHENSIVE METABOLIC PANEL
ALT: 19 [IU]/L (ref 0–32)
AST: 19 [IU]/L (ref 0–40)
Albumin: 4.2 g/dL (ref 3.8–4.9)
Alkaline Phosphatase: 88 [IU]/L (ref 44–121)
BUN/Creatinine Ratio: 16 (ref 9–23)
BUN: 12 mg/dL (ref 6–24)
Bilirubin Total: <0.2 mg/dL (ref 0.0–1.2)
CO2: 23 mmol/L (ref 20–29)
Calcium: 9.7 mg/dL (ref 8.7–10.2)
Chloride: 103 mmol/L (ref 96–106)
Creatinine, Ser: 0.75 mg/dL (ref 0.57–1.00)
Globulin, Total: 3.1 g/dL (ref 1.5–4.5)
Glucose: 145 mg/dL — ABNORMAL HIGH (ref 70–99)
Potassium: 3.7 mmol/L (ref 3.5–5.2)
Sodium: 142 mmol/L (ref 134–144)
Total Protein: 7.3 g/dL (ref 6.0–8.5)
eGFR: 94 mL/min/{1.73_m2} (ref >59)

## 2023-06-03 LAB — HEMOGLOBIN A1C
Est. average glucose Bld gHb Est-mCnc: 143 mg/dL
Hgb A1c MFr Bld: 6.6 % — ABNORMAL HIGH (ref 4.8–5.6)

## 2023-06-04 ENCOUNTER — Other Ambulatory Visit: Payer: Self-pay | Admitting: Internal Medicine

## 2023-06-04 ENCOUNTER — Other Ambulatory Visit (HOSPITAL_COMMUNITY): Payer: Self-pay

## 2023-06-04 MED ORDER — ATORVASTATIN CALCIUM 10 MG PO TABS
10.0000 mg | ORAL_TABLET | Freq: Every day | ORAL | 3 refills | Status: DC
  Filled 2023-06-04: qty 90, 90d supply, fill #0
  Filled 2023-09-16: qty 90, 90d supply, fill #1
  Filled 2023-12-15: qty 90, 90d supply, fill #2
  Filled 2024-04-14: qty 90, 90d supply, fill #3

## 2023-06-06 ENCOUNTER — Encounter: Payer: Self-pay | Admitting: Internal Medicine

## 2023-06-06 ENCOUNTER — Other Ambulatory Visit (HOSPITAL_COMMUNITY): Payer: Self-pay

## 2023-06-06 NOTE — Telephone Encounter (Signed)
 Care team updated and letter sent for eye exam notes.

## 2023-06-13 ENCOUNTER — Ambulatory Visit: Payer: Commercial Managed Care - PPO | Admitting: Podiatry

## 2023-06-13 ENCOUNTER — Encounter: Payer: Self-pay | Admitting: Podiatry

## 2023-06-13 DIAGNOSIS — M79674 Pain in right toe(s): Secondary | ICD-10-CM

## 2023-06-13 DIAGNOSIS — M79675 Pain in left toe(s): Secondary | ICD-10-CM | POA: Diagnosis not present

## 2023-06-13 DIAGNOSIS — Q828 Other specified congenital malformations of skin: Secondary | ICD-10-CM

## 2023-06-13 DIAGNOSIS — B351 Tinea unguium: Secondary | ICD-10-CM | POA: Diagnosis not present

## 2023-06-13 NOTE — Progress Notes (Signed)
  Subjective:  [name], female    DOB: 10/23/1965,  MRN: [id number]  Shelby Wyatt presents to clinic today for painful porokeratotic lesion(s) of both feet and painful mycotic toenails that limit ambulation. Painful toenails interfere with ambulation. Aggravating factors include wearing enclosed shoe gear. Pain is relieved with periodic professional debridement. Painful porokeratotic lesions are aggravated when weightbearing with and without shoegear. Pain is relieved with periodic professional debridement.   New problem(s): None.   PCP is Marcine Matar, MD. LOV was 09/21/2022.  Allergies  Allergen Reactions   Augmentin [Amoxicillin-Pot Clavulanate] Other (See Comments)    Significant abdominal pain, diarrhea, following Augmentin specifically (not penicillin allergy)   Lisinopril Cough   Terbinafine And Related Hives    Review of Systems: Negative except as noted in the HPI.  Objective: No changes noted in today's physical examination. There were no vitals filed for this visit. Shelby Wyatt is a pleasant 57 y.o. female in NAD. AAO x 3.  Vascular Examination: Capillary refill time immediate b/l. Vascular status intact b/l with palpable pedal pulses. Pedal hair present b/l. No edema. No pain with calf compression b/l. Skin temperature gradient WNL b/l. No ischemia or gangrene noted b/l LE. No cyanosis or clubbing noted b/l LE.  Neurological Examination: Sensation grossly intact b/l with 10 gram monofilament. Vibratory sensation intact b/l.   Dermatological Examination: Pedal skin with normal turgor, texture and tone b/l.  No open wounds. No interdigital macerations.   Toenails 1-5 b/l thick, discolored, elongated with subungual debris and pain on dorsal palpation.   Porokeratotic lesion(s) plantar midfoot b/l. No erythema, no edema, no drainage, no fluctuance.  Musculoskeletal Examination: Normal muscle strength 5/5 to all lower extremity  muscle groups bilaterally. No pain, crepitus or joint limitation noted with ROM b/l LE. No gross bony pedal deformities b/l. Patient ambulates independently without assistive aids.  Radiographs: None  Lab Results  Component Value Date   HGBA1C 6.6 (H) 06/02/2023   Assessment/Plan: 1. Pain due to onychomycosis of toenails of both feet   -Consent given for treatment as described below: -Examined patient. -Patient to continue soft, supportive shoe gear daily. -Mycotic toenails 1-5 bilaterally were debrided in length and girth with sterile nail nippers and dremel without incident. -Porokeratotic lesion(s) plantarlateral aspect of midfoot b/l pared and enucleated with sterile currette without incident. Total number of lesions debrided=2. -Patient/POA to call should there be question/concern in the interim.   No follow-ups on file.  Louann Sjogren, DPM

## 2023-06-24 ENCOUNTER — Ambulatory Visit: Payer: Commercial Managed Care - PPO

## 2023-07-07 ENCOUNTER — Ambulatory Visit: Payer: Commercial Managed Care - PPO

## 2023-07-20 ENCOUNTER — Ambulatory Visit: Payer: Commercial Managed Care - PPO

## 2023-08-09 ENCOUNTER — Encounter: Payer: Self-pay | Admitting: Podiatry

## 2023-08-09 ENCOUNTER — Ambulatory Visit: Payer: Commercial Managed Care - PPO | Admitting: Podiatry

## 2023-08-09 VITALS — Ht 62.0 in | Wt 181.0 lb

## 2023-08-09 DIAGNOSIS — B351 Tinea unguium: Secondary | ICD-10-CM

## 2023-08-09 DIAGNOSIS — Q828 Other specified congenital malformations of skin: Secondary | ICD-10-CM

## 2023-08-09 DIAGNOSIS — M79674 Pain in right toe(s): Secondary | ICD-10-CM | POA: Diagnosis not present

## 2023-08-09 DIAGNOSIS — M79672 Pain in left foot: Secondary | ICD-10-CM

## 2023-08-09 DIAGNOSIS — M79675 Pain in left toe(s): Secondary | ICD-10-CM

## 2023-08-09 DIAGNOSIS — R7303 Prediabetes: Secondary | ICD-10-CM

## 2023-08-09 DIAGNOSIS — M79671 Pain in right foot: Secondary | ICD-10-CM | POA: Diagnosis not present

## 2023-08-10 ENCOUNTER — Ambulatory Visit: Payer: Self-pay | Admitting: Internal Medicine

## 2023-08-10 DIAGNOSIS — S8012XA Contusion of left lower leg, initial encounter: Secondary | ICD-10-CM | POA: Diagnosis not present

## 2023-08-10 DIAGNOSIS — S8002XA Contusion of left knee, initial encounter: Secondary | ICD-10-CM | POA: Diagnosis not present

## 2023-08-10 DIAGNOSIS — M25562 Pain in left knee: Secondary | ICD-10-CM | POA: Diagnosis not present

## 2023-08-10 NOTE — Telephone Encounter (Signed)
 Copied from CRM [id number]. Topic: Clinical - Red Word Triage >> Aug 10, 2023 12:45 PM Albin Felling L wrote: Red Word that prompted transfer to Nurse Triage: Patient fell last week, patient having pain in left knee.   Chief Complaint: Knee Pain Symptoms: Knee Pain, Partial to Full Weight Bearing with Pain Frequency: One Week Pertinent Negatives: Patient denies numbness, tingling, inability to walk.  Disposition: [] ED /[x] Urgent Care (no appt availability in office) / [] Appointment(In office/virtual)/ []  [facility]/ [] Home Care/ [] Refused Recommended Disposition /[] [facility]/ [x]  Follow-up with PCP Additional Notes: FM is being triaged for knee pain after a fall last week. The patient reports the pain is moderate in nature and is constant. NSAIDS provide some relief but the pain does return. Advised the patient be treated in an Urgent Care due to no availability in office at this time. Patient agreed to plan. Offered appointment for follow up, and patient agreed to schedule.   Reason for Disposition  Knee giving way (or buckling) when walking  Answer Assessment - Initial Assessment Questions 1. MECHANISM: "How did the injury happen?" (e.g., twisting injury, direct blow)      Fall  2. ONSET: "When did the injury happen?" (Minutes or hours ago)      One Week Ago  3. LOCATION: "Where is the injury located?"      Left Knee  4. APPEARANCE of INJURY: "What does the injury look like?"      Swelling  5. SEVERITY: "Can you put weight on that leg?" "Can you walk?"      Limping  6. SIZE: For cuts, bruises, or swelling, ask: "How large is it?" (e.g., inches or centimeters;  entire joint)      Swelling  7. PAIN: "Is there pain?" If Yes, ask: "How bad is the pain?"  "What does it keep you from doing?" (e.g., Scale 1-10; or mild, moderate, severe)   -  NONE: (0): no pain   -  MILD (1-3): doesn't interfere with normal activities    -  MODERATE (4-7): interferes with normal  activities (e.g., work or school) or awakens from sleep, limping    -  SEVERE (8-10): excruciating pain, unable to do any normal activities, unable to walk     8  8. TETANUS: For any breaks in the skin, ask: "When was the last tetanus booster?"     No  9. OTHER SYMPTOMS: "Do you have any other symptoms?"  (e.g., "pop" when knee injured, swelling, locking, buckling)      Swelling occurred after the fall. No popping sensation  10. PREGNANCY: "Is there any chance you are pregnant?" "When was your last menstrual period?"       No and No  Protocols used: Knee Injury-A-AH

## 2023-08-10 NOTE — Telephone Encounter (Signed)
 noted

## 2023-08-11 ENCOUNTER — Ambulatory Visit
Admission: RE | Admit: 2023-08-11 | Discharge: 2023-08-11 | Disposition: A | Discharge status: Home / Self Care | Payer: Commercial Managed Care - PPO | Source: Ambulatory Visit | Attending: Internal Medicine | Admitting: Internal Medicine

## 2023-08-11 DIAGNOSIS — Z1231 Encounter for screening mammogram for malignant neoplasm of breast: Secondary | ICD-10-CM | POA: Diagnosis not present

## 2023-08-12 NOTE — Progress Notes (Signed)
  Subjective:  [name], female    DOB: 1965/09/06,  MRN: [id number]  58 y.o. female presents painful porokeratotic lesion(s) left foot and right foot and painful mycotic toenails that limit ambulation. Painful toenails interfere with ambulation. Aggravating factors include wearing enclosed shoe gear. Pain is relieved with periodic professional debridement. Painful porokeratotic lesions are aggravated when weightbearing with and without shoegear. Pain is relieved with periodic professional debridement.  Chief Complaint  Patient presents with   Nail Problem    Pt is here for [facility] PCP is Dr Laural Benes and LOV was in December.    New problem(s): None   PCP is Marcine Matar, MD.  Allergies  Allergen Reactions   Augmentin [Amoxicillin-Pot Clavulanate] Other (See Comments)    Significant abdominal pain, diarrhea, following Augmentin specifically (not penicillin allergy)   Lisinopril Cough   Terbinafine And Related Hives    Review of Systems: Negative except as noted in the HPI.   Objective:  Shelby Wyatt is a pleasant 58 y.o. female WD, WN in NAD. AAO x 3.  Vascular Examination: Vascular status intact b/l with palpable pedal pulses. CFT immediate b/l. Pedal hair present. No edema. No pain with calf compression b/l. Skin temperature gradient WNL b/l. No varicosities noted. No cyanosis or clubbing noted.  Neurological Examination: Sensation grossly intact b/l with 10 gram monofilament. Vibratory sensation intact b/l.  Dermatological Examination: Pedal skin with normal turgor, texture and tone b/l. No open wounds nor interdigital macerations noted. Toenails 1-5 b/l thick, discolored, elongated with subungual debris and pain on dorsal palpation.   Porokeratotic lesion(s) plantarlateral aspect of midfoot b/l. No erythema, no edema, no drainage, no fluctuance.  Musculoskeletal Examination: Muscle strength 5/5 to b/l LE.  No pain, crepitus noted b/l. No gross  pedal deformities. Patient ambulates independently without assistive aids.   Radiographs: None Last A1c:      Latest Ref Rng & Units 06/02/2023    4:50 PM 10/20/2022    4:31 PM  Hemoglobin A1C  Hemoglobin-A1c 4.8 - 5.6 % 6.6  6.1      Assessment:   1. Pain due to onychomycosis of toenails of both feet   2. Porokeratosis   3. Pain in both feet   4. Prediabetes    Plan:  -Consent given for treatment as described below: -Examined patient. -Continue supportive shoe gear daily. -Mycotic toenails 1-5 bilaterally were debrided in length and girth with sterile nail nippers and dremel without incident. -Porokeratotic lesion(s) plantarlateral aspect of midfoot b/l pared and enucleated with sterile currette without incident. Total number of lesions debrided=2. -Patient/POA to call should there be question/concern in the interim.  Return in about 3 months (around 11/06/2023).  Freddie Breech, DPM      [place] LOCATION: [address] [place] [address]                   Office [phone]   [facility] LOCATION: [address] [place], [place] [address] Office [phone]

## 2023-08-16 ENCOUNTER — Encounter: Payer: Self-pay | Admitting: Internal Medicine

## 2023-08-24 ENCOUNTER — Ambulatory Visit: Payer: Commercial Managed Care - PPO | Attending: Physician Assistant | Admitting: Physician Assistant

## 2023-08-24 VITALS — BP 112/68 | HR 77 | Resp 16 | Wt 186.0 lb

## 2023-08-24 DIAGNOSIS — S8992XD Unspecified injury of left lower leg, subsequent encounter: Secondary | ICD-10-CM

## 2023-08-24 DIAGNOSIS — Z7984 Long term (current) use of oral hypoglycemic drugs: Secondary | ICD-10-CM | POA: Diagnosis not present

## 2023-08-24 DIAGNOSIS — E1165 Type 2 diabetes mellitus with hyperglycemia: Secondary | ICD-10-CM | POA: Diagnosis not present

## 2023-08-24 DIAGNOSIS — M25562 Pain in left knee: Secondary | ICD-10-CM

## 2023-08-24 LAB — GLUCOSE, POCT (MANUAL RESULT ENTRY): POC Glucose: 109 mg/dL — AB (ref 70–99)

## 2023-08-24 NOTE — Progress Notes (Signed)
 [name], female   DOB: Aug 27, 1965, 58 y.o.   MRN: [id number]   Shelby Wyatt, is a 58 y.o. female  [id number]  [id number]  DOB - 08/17/1965  Chief Complaint  Patient presents with   Knee Injury       Subjective:   Shelby Wyatt is a 58 y.o. female here today for L knee pain that started approximately 3 weeks ago after tripping on a rolling trash can.  Initially she did not feel any pain but several hours later her knee was stiff and she could not extend it.  It has swollen on and off since then.  She went to a non [facility](I'm unable to see records.)  she says they xrayed and no abnormalities.  They gave her an open patella velcro support.  Aleve has helped with the pain.  She is starting to feel like "something is wrong."  She has had L knee problems in the past and had PT with some success.  This fall seemed to exacerbate and worsen her L knee problems.    No problems updated.  ALLERGIES: Allergies  Allergen Reactions   Augmentin [Amoxicillin-Pot Clavulanate] Other (See Comments)    Significant abdominal pain, diarrhea, following Augmentin specifically (not penicillin allergy)   Lisinopril Cough   Terbinafine And Related Hives    PAST MEDICAL HISTORY: Past Medical History:  Diagnosis Date   Allergy    seasonal   Anemia    Cancer (HCC)    colon, cancer dec 2022   Hypertension    No pertinent past medical history    Pre-diabetes    Tubal pregnancy     MEDICATIONS AT HOME: Prior to Admission medications   Medication Sig Start Date End Date Taking? Authorizing Provider  amLODipine (NORVASC) 10 MG tablet Take 1 tablet (10 mg total) by mouth daily. 06/02/23  Yes Marcine Matar, MD  atorvastatin (LIPITOR) 10 MG tablet Take 1 tablet (10 mg total) by mouth daily. 06/04/23  Yes Marcine Matar, MD  metFORMIN (GLUCOPHAGE) 500 MG tablet Take 1 tablet (500 mg total) by mouth 2 (two) times daily with a meal. 06/02/23  Yes  Marcine Matar, MD  miconazole (MICATIN) 2 % cream Apply 1 Application topically 2 (two) times daily. 09/17/22  Yes Marcine Matar, MD  Multiple Vitamins-Minerals (MULTIVITAMIN WITH MINERALS) tablet Take 1 tablet by mouth daily. Centrum 50+   Yes [provider]  naproxen sodium (ALEVE) 220 MG tablet Take 2 tablets (440 mg total) by mouth daily as needed (cramps). 04/21/21  Yes Mansouraty, Netty Starring., MD  nicotine (NICODERM CQ - DOSED IN MG/24 HOURS) 14 mg/24hr patch Place 1 patch (14 mg total) onto the skin daily. 06/02/23  Yes Marcine Matar, MD  valsartan (DIOVAN) 40 MG tablet Take 1 tablet (40 mg total) by mouth daily. 06/02/23  Yes Marcine Matar, MD    ROS: Neg HEENT Neg resp Neg cardiac Neg GI Neg GU Neg psych Neg neuro  Objective:   Vitals:   08/24/23 1611  BP: 112/68  Pulse: 77  Resp: 16  SpO2: 98%  Weight: 186 lb (84.4 kg)   Exam General appearance : Awake, alert, not in any distress. Speech Clear. Not toxic looking HEENT: Atraumatic and Normocephalic Neck: Supple, no JVD. No cervical lymphadenopathy.  Chest: Good air entry bilaterally, CTAB.  No rales/rhonchi/wheezing CVS: S1 S2 regular, no murmurs.  Extremities: B/L Lower Ext shows no edema, both legs are warm to touch R  vs L knee examined.  There is no erythema or ballotment of the L knee.  There is minimal soft tissue swelling without fluctuation medial suprapatellar area.  Tender lateral to patella.  Ligaments stable.   Neurology: Awake alert, and oriented X 3, CN II-XII intact, Non focal Skin: No Rash  Data Review Lab Results  Component Value Date   HGBA1C 6.6 (H) 06/02/2023   HGBA1C 6.1 10/20/2022   HGBA1C 6.4 05/18/2022    Assessment & Plan   1. Type 2 diabetes mellitus with hyperglycemia, without long-term current use of insulin (HCC) (Primary) Blood sugar good today - Glucose (CBG)  2. Knee injury, left, subsequent encounter Wear support.  Aleve for discomfort.  Concern  for tear/partial tear.  - Ambulatory referral to Orthopedic Surgery  3. Pain in lateral portion of left knee See #2 - Ambulatory referral to Orthopedic Surgery    Return for april appt as scheduled.  The patient was given clear instructions to go to ER or return to medical center if symptoms don't improve, worsen or new problems develop. The patient verbalized understanding. The patient was told to call to get lab results if they haven't heard anything in the next week.      Georgian Co, PA-C [facility] and [facility] [place], [place] [phone]   08/24/2023, 5:00 PM

## 2023-08-24 NOTE — Patient Instructions (Signed)
Knee Sprain, Adult  A knee sprain is a stretch or tear in a knee ligament. Knee ligaments are tissues that connect the bones of the knee joint to each other. What are the causes? This condition often results from: A fall. An injury to the knee. What are the signs or symptoms? Symptoms of this condition include: Trouble straightening or bending the leg. Swelling in the knee. Bruising around the knee. Tenderness or pain in the knee. Sudden muscle tightening (spasm) around the knee. How is this diagnosed? This condition may be diagnosed based on: A physical exam. A history of what happened just before you started to have symptoms. Tests. These may include: An X-ray. This may be done to make sure no bones are broken or moved out of position (dislocated). An MRI. This may be done to check if any of the ligaments are torn and to check for other damage. A physical exam that includes stress testing of the knee. This may be done to check for damage to ligaments. How is this treated? Treatment for this condition may involve: Keeping the knee in a straightened position (immobilized) with a cast, brace, or splint. Applying ice to the knee. This helps with pain and swelling. Raising (elevating) the knee above the level of your heart when you are resting. This helps with pain and swelling. Taking medicine for pain. Doing exercises to prevent or limit long-term weakness or stiffness in your knee. Having surgery to reconnect ligaments to the bone or to reconstruct ligaments. This may be needed if one or more ligaments are fully torn. Follow these instructions at home: If you have a removable splint or brace: Wear the splint or brace as told by your health care provider. Remove it only as told by your health care provider. Check the skin around the splint or brace every day. Tell your health care provider about any concerns. Loosen the splint or brace if your toes tingle, become numb, or turn cold  and blue. Keep the splint or brace clean and dry. If you have a nonremovable cast: Do not put pressure on any part of the cast until it is fully hardened. This may take several hours. Do not stick anything inside the cast to scratch your skin. Doing that increases your risk of infection. Check the skin around the cast every day. Tell your health care provider about any concerns. You may put lotion on dry skin around the edges of the cast. Do not put lotion on the skin underneath the cast. Keep the cast clean and dry. Bathing If the splint, brace, or cast is not waterproof: Do not let it get wet. Cover it with a watertight covering when you take a bath or a shower. Managing pain, stiffness, and swelling  If directed, put ice on the injured area. To do this: If you have a removable splint or brace, remove it as told by your health care provider. Put ice in a plastic bag. Place a towel between your skin and the bag or between your cast and the bag. Leave the ice on for 20 minutes, 2-3 times a day. If your skin turns bright red, remove the ice right away to prevent skin damage. The risk of skin damage is higher if you cannot feel pain, heat, or cold. Move your toes often to reduce stiffness and swelling. Elevate the injured area above the level of your heart while you are sitting or lying down. General instructions Take over-the-counter and prescription medicines only as  told by your health care provider. Do not use any products that contain nicotine or tobacco. These products include cigarettes, chewing tobacco, and vaping devices, such as e-cigarettes. These can delay healing. If you need help quitting, ask your health care provider. Do exercises as told by your health care provider. Contact a health care provider if: You have pain that gets worse. The cast, brace, or splint does not fit right or gets damaged. Get help right away if: You cannot use your injured knee to support any of your  body weight (cannot bear weight). You cannot move the injured joint. You cannot walk more than a few steps without pain or without your knee buckling. You have a lot of pain, swelling, or numbness in the leg below the cast, brace, or splint. Your foot or toes are numb, cold, or blue after you loosen your splint or brace. This information is not intended to replace advice given to you by your health care provider. Make sure you discuss any questions you have with your health care provider. Document Revised: 11/23/2021 Document Reviewed: 11/23/2021 Elsevier Patient Education  2024 [employer].

## 2023-08-24 NOTE — Progress Notes (Signed)
 Left knee discomfort after falling in the yard 2 weeks  XR was normal at [facility] but states something is wrong  Pain 5/10 prior to putting brace on leg.   A couple of days after the fall she had to use crutches

## 2023-08-29 LAB — HM DIABETES EYE EXAM

## 2023-08-31 NOTE — Progress Notes (Unsigned)
 Office Visit Note   Patient: Shelby Wyatt           Date of Birth: [birth year]           MRN: [id number] Visit Date: 09/01/2023              Requested by:  Anders Simmonds, PA-C [address] [place],  [place] [address]  PCP: Marcine Matar, MD   Assessment & Plan: Visit Diagnoses: No diagnosis found.  Plan: ***  Follow-Up Instructions: No follow-ups on file.   Orders:  No orders of the defined types were placed in this encounter. No orders of the defined types were placed in this encounter.    Procedures: No procedures performed   Clinical Data: No additional findings.   Subjective: No chief complaint on file.  HPI  Review of Systems   Objective: Vital Signs: There were no vitals taken for this visit.  Physical Exam  Ortho Exam  Specialty Comments:  No specialty comments available.  Imaging: No results found.   PMFS History: Patient Active Problem List   Diagnosis Date Noted  . Numbness of fingers of both hands 10/12/2021  . Glaucoma suspect 05/27/2021  . Serrated polyp of colon 04/02/2021  . Hx of adenomatous colonic polyps 04/02/2021  . Cecal polyp 04/02/2021  . Abnormal colonoscopy 04/02/2021  . Cough due to ACE inhibitor 03/05/2021  . Obesity (BMI 30-39.9) 03/05/2021  . Essential hypertension 12/25/2020  . Prediabetes 12/25/2020  . Perimenopausal 12/25/2020  . Tobacco dependence 12/01/2020  . Incontinence of feces 12/01/2020  . Abdominal pain 12/01/2020   Past Medical History:  Diagnosis Date  . Allergy    seasonal  . Anemia   . Cancer (HCC)    colon, cancer dec 2022  . Hypertension   . No pertinent past medical history   . Pre-diabetes   . Tubal pregnancy     Family History  Problem Relation Age of Onset  . Hypertension Mother   . Diabetes Mother   . Breast cancer Mother   . Hypertension Maternal Grandmother   . Esophageal cancer Paternal Grandmother   . Hypertension Paternal Grandmother   .  Hypertension Paternal Grandfather   . Colon cancer Neg Hx   . Rectal cancer Neg Hx   . Stomach cancer Neg Hx   . Inflammatory bowel disease Neg Hx   . Liver disease Neg Hx   . Pancreatic cancer Neg Hx   . Colon polyps Neg Hx   . Crohn's disease Neg Hx   . Ulcerative colitis Neg Hx     Past Surgical History:  Procedure Laterality Date  . COLONOSCOPY    . COLONOSCOPY WITH PROPOFOL N/A 04/07/2021   Procedure: COLONOSCOPY WITH PROPOFOL;  Surgeon: Meridee Score Netty Starring., MD;  Location: [facility];  Service: Gastroenterology;  Laterality: N/A;  . ENDOSCOPIC MUCOSAL RESECTION N/A 04/07/2021   Procedure: ENDOSCOPIC MUCOSAL RESECTION;  Surgeon: Meridee Score Netty Starring., MD;  Location: [facility];  Service: Gastroenterology;  Laterality: N/A;  . HEMOSTASIS CLIP PLACEMENT  04/07/2021   Procedure: HEMOSTASIS CLIP PLACEMENT;  Surgeon: Lemar Lofty., MD;  Location: [facility];  Service: Gastroenterology;;  . LAPAROSCOPIC RIGHT HEMI COLECTOMY Right 05/27/2021   Procedure: LAPAROSCOPIC RIGHT HEMI COLECTOMY;  Surgeon: Andria Meuse, MD;  Location: [facility];  Service: General;  Laterality: Right;  . laporoscopy    . MOUTH SURGERY    . SUBMUCOSAL LIFTING INJECTION  04/07/2021   Procedure: SUBMUCOSAL LIFTING INJECTION;  Surgeon:  Mansouraty, Netty Starring., MD;  Location: [facility];  Service: Gastroenterology;;  . TUBAL LIGATION    . WISDOM TOOTH EXTRACTION     Social History   Occupational History  . Occupation: Food services - [employer]  Tobacco Use  . Smoking status: Every Day    Current packs/day: 0.25    Types: Cigarettes  . Smokeless tobacco: Never  Vaping Use  . Vaping status: Never Used  Substance and Sexual Activity  . Alcohol use: Yes    Comment: occasionally  . Drug use: Yes    Types: Marijuana    Comment: Smoked at the beginning of month  . Sexual activity: Yes    Birth control/protection: None

## 2023-09-01 ENCOUNTER — Other Ambulatory Visit (INDEPENDENT_AMBULATORY_CARE_PROVIDER_SITE_OTHER): Payer: Self-pay

## 2023-09-01 ENCOUNTER — Ambulatory Visit: Admitting: Orthopaedic Surgery

## 2023-09-01 DIAGNOSIS — M25562 Pain in left knee: Secondary | ICD-10-CM | POA: Diagnosis not present

## 2023-10-04 ENCOUNTER — Ambulatory Visit: Payer: Commercial Managed Care - PPO | Attending: Internal Medicine | Admitting: Internal Medicine

## 2023-10-04 ENCOUNTER — Encounter: Payer: Self-pay | Admitting: Internal Medicine

## 2023-10-04 VITALS — BP 129/68 | HR 80 | Wt 182.2 lb

## 2023-10-04 DIAGNOSIS — E66811 Obesity, class 1: Secondary | ICD-10-CM

## 2023-10-04 DIAGNOSIS — E1159 Type 2 diabetes mellitus with other circulatory complications: Secondary | ICD-10-CM | POA: Diagnosis not present

## 2023-10-04 DIAGNOSIS — E785 Hyperlipidemia, unspecified: Secondary | ICD-10-CM

## 2023-10-04 DIAGNOSIS — E1169 Type 2 diabetes mellitus with other specified complication: Secondary | ICD-10-CM | POA: Diagnosis not present

## 2023-10-04 DIAGNOSIS — F17200 Nicotine dependence, unspecified, uncomplicated: Secondary | ICD-10-CM

## 2023-10-04 DIAGNOSIS — I152 Hypertension secondary to endocrine disorders: Secondary | ICD-10-CM

## 2023-10-04 LAB — POCT GLYCOSYLATED HEMOGLOBIN (HGB A1C): HbA1c, POC (controlled diabetic range): 6.3 % (ref 0.0–7.0)

## 2023-10-04 LAB — GLUCOSE, POCT (MANUAL RESULT ENTRY): POC Glucose: 214 mg/dL — AB (ref 70–99)

## 2023-10-04 NOTE — Patient Instructions (Addendum)
 Will send prescription for diabetic testing supplies.  Diabetes: Types of Medical Care to Help You Manage Living with and managing diabetes, also known as diabetes mellitus, can be a challenge. Your diabetes care may involve a team of health care providers to help you manage. This team may include: An endocrinologist. This is a physician who specializes in diabetes. Nurses. An expert in healthy eating called a dietitian. A diabetes care and education specialist. An eye doctor. A foot specialist called a podiatrist. How to manage your diabetes Managing your diabetes involves a lot of steps to keep you healthy. Your team will follow guidelines to help you get the best care possible. Here are some general guidelines for managing your diabetes: Physical exams When you're first diagnosed with diabetes, and each year after that, your provider will ask about your medical and family history. You'll also have a physical exam. It may include: Measuring your height, weight, and body mass index (BMI). Checking your blood pressure. Your target blood pressure may vary based on things like your age and health conditions. A thyroid  exam. A skin exam. Screening for nerve damage. This may include checking your hands, feet, and arms for: Pain. Tingling or numbness. Weakness. An exam to check your feet. They'll be checked for cuts, bruises, redness, blisters, sores, or other problems. Screening for blood vessel problems. This may include checking the pulse and temperature in your legs and feet. Blood tests Depending on your treatment plan, you may have these tests: Hemoglobin A1C (HbA1C). This test gives information about your blood sugar levels, also called glucose levels, over the past 2-3 months. It's used to adjust your treatment plan, if needed. Lipid testing. This includes total cholesterol, LDL and HDL cholesterol, and triglyceride levels. The goal for LDL is less than 100 mg/dL (5.5 mmol/L). If  you're at high risk for problems, the goal is less than 70 mg/dL (3.9 mmol/L). The goal for HDL is 40 mg/dL (2.2 mmol/L) or higher for males, and 50 mg/dL (2.8 mmol/L) or higher for females. The goal for triglycerides is less than 150 mg/dL (8.3 mmol/L). Liver function tests. Kidney function tests. Thyroid  function tests.  Dental and eye exams  Visit your dentist two times a year for checkups. Get eye exams as recommended by your provider. This may include: If you have type 1 diabetes, get an eye exam within 5 years after you're diagnosed. Then get exams once a year after your first exam. Children with type 1 diabetes should get an eye exam when they're 48 years old or older and they've had diabetes for 3-5 years. After the first exam, they should get an eye exam every 2 years. If you have type 2 diabetes, get an eye exam as soon as you're diagnosed. Then get one every 1-2 years after your first exam. Shots or vaccines Get shots or vaccines as told. Guidelines include: Everyone aged 6 months and older should get a flu shot every year. People at least 56 years old who have diabetes should get the pneumonia vaccine. All adults who get diagnosed with diabetes should get the hepatitis B vaccine. For all other vaccines, follow the recommendations from the Centers for Disease Control and Prevention (CDC) based on your age. Mental and emotional health Screening for eating disorders, anxiety, and depression is suggested at the time of diagnosis and then as needed. If you show symptoms, you may need more evaluation. You may need to work with a mental health provider. Follow these instructions at home:  Treatment plan You'll monitor your blood sugar levels and may give yourself insulin . Your treatment plan will be reviewed at every medical visit. You and your provider will discuss: How you're taking your medicines, including insulin . Any side effects you have. Your target goals for your blood sugar  level. How often you check your blood sugar level. Lifestyle habits, such as: Your activity level. Any use of tobacco, alcohol, or other substances. Education Your provider will assess how well you manage your blood sugar levels and medicines. You may be referred to: A certified diabetes care and education specialist. This person can help you manage your diabetes throughout your life. A dietitian who can help with your eating plan. An exercise specialist who can discuss your activity level and exercise plan. General instructions Take medicines only as told. Where to find more information American Diabetes Association (ADA): diabetes.org Association of Diabetes Care & Education Specialists (ADCES): adces.org/diabetes-education-dsmes International Diabetes Federation (IDF): http://hill.biz/ This information is not intended to replace advice given to you by your health care provider. Make sure you discuss any questions you have with your health care provider. Document Revised: 01/07/2023 Document Reviewed: 01/07/2023 Elsevier Patient Education  2024 Elsevier Inc.  Healthy Eating, Adult Healthy eating may help you get and keep a healthy body weight, reduce the risk of chronic disease, and live a long and productive life. It is important to follow a healthy eating pattern. Your nutritional and calorie needs should be met mainly by different nutrient-rich foods. What are tips for following this plan? Reading food labels Read labels and choose the following: Reduced or low sodium products. Juices with 100% fruit juice. Foods with low saturated fats (<3 g per serving) and high polyunsaturated and monounsaturated fats. Foods with whole grains, such as whole wheat, cracked wheat, brown rice, and wild rice. Whole grains that are fortified with folic acid. This is recommended for females who are pregnant or who want to become pregnant. Read labels and do not eat or drink the following: Foods or drinks  with added sugars. These include foods that contain brown sugar, corn sweetener, corn syrup, dextrose, fructose, glucose, high-fructose corn syrup, honey, invert sugar, lactose, malt syrup, maltose, molasses, raw sugar, sucrose, trehalose, or turbinado sugar. Limit your intake of added sugars to less than 10% of your total daily calories. Do not eat more than the following amounts of added sugar per day: 6 teaspoons (25 g) for females. 9 teaspoons (38 g) for males. Foods that contain processed or refined starches and grains. Refined grain products, such as white flour, degermed cornmeal, white bread, and white rice. Shopping Choose nutrient-rich snacks, such as vegetables, whole fruits, and nuts. Avoid high-calorie and high-sugar snacks, such as potato chips, fruit snacks, and candy. Use oil-based dressings and spreads on foods instead of solid fats such as butter, margarine, sour cream, or cream cheese. Limit pre-made sauces, mixes, and "instant" products such as flavored rice, instant noodles, and ready-made pasta. Try more plant-protein sources, such as tofu, tempeh, black beans, edamame, lentils, nuts, and seeds. Explore eating plans such as the Mediterranean diet or vegetarian diet. Try heart-healthy dips made with beans and healthy fats like hummus and guacamole. Vegetables go great with these. Cooking Use oil to saut or stir-fry foods instead of solid fats such as butter, margarine, or lard. Try baking, boiling, grilling, or broiling instead of frying. Remove the fatty part of meats before cooking. Steam vegetables in water or broth. Meal planning  At meals, imagine dividing your plate  into fourths: One-half of your plate is fruits and vegetables. One-fourth of your plate is whole grains. One-fourth of your plate is protein, especially lean meats, poultry, eggs, tofu, beans, or nuts. Include low-fat dairy as part of your daily diet. Lifestyle Choose healthy options in all settings,  including home, work, school, restaurants, or stores. Prepare your food safely: Wash your hands after handling raw meats. Where you prepare food, keep surfaces clean by regularly washing with hot, soapy water. Keep raw meats separate from ready-to-eat foods, such as fruits and vegetables. Cook seafood, meat, poultry, and eggs to the recommended temperature. Get a food thermometer. Store foods at safe temperatures. In general: Keep cold foods at 31F (4.4C) or below. Keep hot foods at 131F (60C) or above. Keep your freezer at [facility] (-17.8C) or below. Foods are not safe to eat if they have been between the temperatures of 40-131F (4.4-60C) for more than 2 hours. What foods should I eat? Fruits Aim to eat 1-2 cups of fresh, canned (in natural juice), or frozen fruits each day. One cup of fruit equals 1 small apple, 1 large banana, 8 large strawberries, 1 cup (237 g) canned fruit,  cup (82 g) dried fruit, or 1 cup (240 mL) 100% juice. Vegetables Aim to eat 2-4 cups of fresh and frozen vegetables each day, including different varieties and colors. One cup of vegetables equals 1 cup (91 g) broccoli or cauliflower florets, 2 medium carrots, 2 cups (150 g) raw, leafy greens, 1 large tomato, 1 large bell pepper, 1 large sweet potato, or 1 medium white potato. Grains Aim to eat 5-10 ounce-equivalents of whole grains each day. Examples of 1 ounce-equivalent of grains include 1 slice of bread, 1 cup (40 g) ready-to-eat cereal, 3 cups (24 g) popcorn, or  cup (93 g) cooked rice. Meats and other proteins Try to eat 5-7 ounce-equivalents of protein each day. Examples of 1 ounce-equivalent of protein include 1 egg,  oz nuts (12 almonds, 24 pistachios, or 7 walnut halves), 1/4 cup (90 g) cooked beans, 6 tablespoons (90 g) hummus or 1 tablespoon (16 g) peanut butter. A cut of meat or fish that is the size of a deck of cards is about 3-4 ounce-equivalents (85 g). Of the protein you eat each week, try to  have at least 8 sounce (227 g) of seafood. This is about 2 servings per week. This includes salmon, trout, herring, sardines, and anchovies. Dairy Aim to eat 3 cup-equivalents of fat-free or low-fat dairy each day. Examples of 1 cup-equivalent of dairy include 1 cup (240 mL) milk, 8 ounces (250 g) yogurt, 1 ounces (44 g) natural cheese, or 1 cup (240 mL) fortified soy milk. Fats and oils Aim for about 5 teaspoons (21 g) of fats and oils per day. Choose monounsaturated fats, such as canola and olive oils, mayonnaise made with olive oil or avocado oil, avocados, peanut butter, and most nuts, or polyunsaturated fats, such as sunflower, corn, and soybean oils, walnuts, pine nuts, sesame seeds, sunflower seeds, and flaxseed. Beverages Aim for 6 eight-ounce glasses of water per day. Limit coffee to 3-5 eight-ounce cups per day. Limit caffeinated beverages that have added calories, such as soda and energy drinks. If you drink alcohol: Limit how much you have to: 0-1 drink a day if you are female. 0-2 drinks a day if you are female. Know how much alcohol is in your drink. In the [place] one drink is one 12 oz bottle of beer (355 mL), one 5  oz glass of wine (148 mL), or one 1 oz glass of hard liquor (44 mL). Seasoning and other foods Try not to add too much salt to your food. Try using herbs and spices instead of salt. Try not to add sugar to food. This information is based on [place] nutrition guidelines. To learn more, visit DisposableNylon.be. Exact amounts may vary. You may need different amounts. This information is not intended to replace advice given to you by your health care provider. Make sure you discuss any questions you have with your health care provider. Document Revised: 03/01/2022 Document Reviewed: 03/01/2022 Elsevier Patient Education  2024 [employer].

## 2023-10-04 NOTE — Progress Notes (Signed)
 [name], female    DOB: [birth year]  MRN: [id number]  CC: Medical Management of Chronic Issues   Subjective: Shelby Wyatt is a 58 y.o. female who presents for chronic ds management. Her concerns today include:  Tob dep, fecal incont, HTN, preDM, obesity, colon CA (s/p RT hemicolectomy 05/2021),  Discussed the use of AI scribe software for clinical note transcription with the patient, who gave verbal consent to proceed.  Discussed the use of AI scribe software for clinical note transcription with the patient, who gave verbal consent to proceed.  History of Present Illness   The patient, with a history of hypertension and prediabetes that has progressed to DM, presents for a four-month follow-up.   HTN:  She reports adherence to her antihypertensive regimen of amlodipine  10mg  and valsartan  40mg  daily. She checks her blood pressure twice a month, which has been consistently below the goal of 130/80. She reports limiting her salt intake, but her diet varies depending on the food available at her workplace.  Tob Dep: The patient has been trying to quit smoking and has been using nicotine  patches twice a week. She reports a reduction in daily cigarette consumption, now smoking three to six cigarettes a day, down from a previous higher amount. She expresses a desire to quit but acknowledges the challenge of 'mind over matter.'  DM:  Results for orders placed or performed in visit on 10/04/23  POCT glucose (manual entry)   Collection Time: 10/04/23  4:44 PM  Result Value Ref Range   POC Glucose 214 (A) 70 - 99 mg/dl  POCT glycosylated hemoglobin (Hb A1C)   Collection Time: 10/04/23  4:46 PM  Result Value Ref Range   Hemoglobin A1C     HbA1c POC (<> result, manual entry)     HbA1c, POC (prediabetic range)     HbA1c, POC (controlled diabetic range) 6.3 0.0 - 7.0 %   The patient was previously in the prediabetes range, but her  A1C of 6.6 on last visit pushed her  into the diabetes range. Reports compliance with metformin  500 mg twice a day.  She does not have any diabetic testing supplies and would be interested in getting some to check blood sugars.  - She reports walking for 15 minutes a day and has noticed a slight decrease in her weight. Her diet varies, with some days eating hospital food and other days opting for hot dogs or chicken sandwiches. She admits to drinking small amount of soda almost daily but has increased her water intake and has been making smoothies a few days a week. - Had eye exam done 08/29/2023 at [facility]  HL:  She has been taking and tolerating atorvastatin  for her cholesterol.  Recent LDL was 91.     Patient Active Problem List   Diagnosis Date Noted   Numbness of fingers of both hands 10/12/2021   Glaucoma suspect 05/27/2021   Serrated polyp of colon 04/02/2021   Hx of adenomatous colonic polyps 04/02/2021   Cecal polyp 04/02/2021   Abnormal colonoscopy 04/02/2021   Cough due to ACE inhibitor 03/05/2021   Obesity (BMI 30-39.9) 03/05/2021   Essential hypertension 12/25/2020   Prediabetes 12/25/2020   Perimenopausal 12/25/2020   Tobacco dependence 12/01/2020   Incontinence of feces 12/01/2020   Abdominal pain 12/01/2020     Current Outpatient Medications on File Prior to Visit  Medication Sig Dispense Refill   amLODipine  (NORVASC ) 10 MG tablet Take 1 tablet (10  mg total) by mouth daily. 90 tablet 1   atorvastatin  (LIPITOR) 10 MG tablet Take 1 tablet (10 mg total) by mouth daily. 90 tablet 3   metFORMIN  (GLUCOPHAGE ) 500 MG tablet Take 1 tablet (500 mg total) by mouth 2 (two) times daily with a meal. 180 tablet 1   miconazole  (MICATIN) 2 % cream Apply 1 Application topically 2 (two) times daily. 30 g 0   Multiple Vitamins-Minerals (MULTIVITAMIN WITH MINERALS) tablet Take 1 tablet by mouth daily. Centrum 50+     naproxen  sodium (ALEVE ) 220 MG tablet Take 2 tablets (440 mg total) by mouth daily as needed (cramps).  30 tablet 0   nicotine  (NICODERM CQ  - DOSED IN MG/24 HOURS) 14 mg/24hr patch Place 1 patch (14 mg total) onto the skin daily. 28 patch 2   valsartan  (DIOVAN ) 40 MG tablet Take 1 tablet (40 mg total) by mouth daily. 90 tablet 1   No current facility-administered medications on file prior to visit.    Allergies  Allergen Reactions   Augmentin  [Amoxicillin -Pot Clavulanate] Other (See Comments)    Significant abdominal pain, diarrhea, following Augmentin  specifically (not penicillin allergy)   Lisinopril  Cough   Terbinafine  And Related Hives    Social History   Socioeconomic History   Marital status: Married    Spouse name: Not on file   Number of children: 0   Years of education: Not on file   Highest education level: Associate degree: occupational, Scientist, product/process development, or vocational program  Occupational History   Occupation: Food services - [employer]  Tobacco Use   Smoking status: Every Day    Current packs/day: 0.25    Types: Cigarettes   Smokeless tobacco: Never  Vaping Use   Vaping status: Never Used  Substance and Sexual Activity   Alcohol use: Yes    Comment: occasionally   Drug use: Yes    Types: Marijuana    Comment: Smoked at the beginning of month   Sexual activity: Yes    Birth control/protection: None  Other Topics Concern   Not on file  Social History Narrative   Not on file   Social Drivers of Health   Financial Resource Strain: Low Risk  (08/24/2023)   Overall Financial Resource Strain (CARDIA)    Difficulty of Paying Living Expenses: Not very hard  Food Insecurity: No Food Insecurity (08/24/2023)   Hunger Vital Sign    Worried About Running Out of Food in the Last Year: Never true    Ran Out of Food in the Last Year: Never true  Transportation Needs: No Transportation Needs (05/31/2023)   PRAPARE - Administrator, Civil Service (Medical): No    Lack of Transportation (Non-Medical): No  Physical Activity: Insufficiently Active (08/24/2023)   Exercise  Vital Sign    Days of Exercise per Week: 2 days    Minutes of Exercise per Session: 30 min  Stress: No Stress Concern Present (08/24/2023)   [employer] of Occupational Health - Occupational Stress Questionnaire    Feeling of Stress : Not at all  Social Connections: Socially Integrated (08/24/2023)   Social Connection and Isolation Panel [NHANES]    Frequency of Communication with Friends and Family: Never    Frequency of Social Gatherings with Friends and Family: More than three times a week    Attends Religious Services: 1 to 4 times per year    Active Member of [employer] or Organizations: No    Attends Banker Meetings: 1 to 4 times per  year    Marital Status: Married  Catering manager Violence: Not At Risk (08/24/2023)   Humiliation, Afraid, Rape, and Kick questionnaire    Fear of Current or Ex-Partner: No    Emotionally Abused: No    Physically Abused: No    Sexually Abused: No    Family History  Problem Relation Age of Onset   Hypertension Mother    Diabetes Mother    Breast cancer Mother    Hypertension Maternal Grandmother    Esophageal cancer Paternal Grandmother    Hypertension Paternal Grandmother    Hypertension Paternal Grandfather    Colon cancer Neg Hx    Rectal cancer Neg Hx    Stomach cancer Neg Hx    Inflammatory bowel disease Neg Hx    Liver disease Neg Hx    Pancreatic cancer Neg Hx    Colon polyps Neg Hx    Crohn's disease Neg Hx    Ulcerative colitis Neg Hx     Past Surgical History:  Procedure Laterality Date   COLONOSCOPY     COLONOSCOPY WITH PROPOFOL  N/A 04/07/2021   Procedure: COLONOSCOPY WITH PROPOFOL ;  Surgeon: Mansouraty, Albino Alu., MD;  Location: [facility];  Service: Gastroenterology;  Laterality: N/A;   ENDOSCOPIC MUCOSAL RESECTION N/A 04/07/2021   Procedure: ENDOSCOPIC MUCOSAL RESECTION;  Surgeon: Brice Campi Albino Alu., MD;  Location: [facility];  Service: Gastroenterology;  Laterality: N/A;   HEMOSTASIS CLIP  PLACEMENT  04/07/2021   Procedure: HEMOSTASIS CLIP PLACEMENT;  Surgeon: Normie Becton., MD;  Location: [facility];  Service: Gastroenterology;;   LAPAROSCOPIC RIGHT HEMI COLECTOMY Right 05/27/2021   Procedure: LAPAROSCOPIC RIGHT HEMI COLECTOMY;  Surgeon: Melvenia Stabs, MD;  Location: [facility];  Service: General;  Laterality: Right;   laporoscopy     MOUTH SURGERY     SUBMUCOSAL LIFTING INJECTION  04/07/2021   Procedure: SUBMUCOSAL LIFTING INJECTION;  Surgeon: Normie Becton., MD;  Location: [facility];  Service: Gastroenterology;;   TUBAL LIGATION     WISDOM TOOTH EXTRACTION      ROS: Review of Systems Negative except as stated above  PHYSICAL EXAM: BP 129/68 (BP Location: Left Arm, Patient Position: Sitting, Cuff Size: Normal)   Pulse 80   Wt 182 lb 3.2 oz (82.6 kg)   SpO2 94%   BMI 33.32 kg/m   Wt Readings from Last 3 Encounters:  10/04/23 182 lb 3.2 oz (82.6 kg)  08/24/23 186 lb (84.4 kg)  08/09/23 181 lb (82.1 kg)    Physical Exam  General appearance - alert, well appearing, and in no distress Mental status - normal mood, behavior, speech, dress, motor activity, and thought processes Neck - supple, no significant adenopathy Chest - clear to auscultation, no wheezes, rales or rhonchi, symmetric air entry Heart - normal rate, regular rhythm, normal S1, S2, no murmurs, rubs, clicks or gallops Extremities - peripheral pulses normal, no pedal edema, no clubbing or cyanosis      Latest Ref Rng & Units 06/02/2023    4:50 PM 09/24/2022    9:32 AM 05/24/2022    4:29 PM  CMP  Glucose 70 - 99 mg/dL 161  096  88   BUN 6 - 24 mg/dL 12  10  12    Creatinine 0.57 - 1.00 mg/dL 0.45  4.09  8.11   Sodium 134 - 144 mmol/L 142  138  136   Potassium 3.5 - 5.2 mmol/L 3.7  3.9  4.2   Chloride 96 - 106 mmol/L 103  108  102  CO2 20 - 29 mmol/L 23  23  22    Calcium  8.7 - 10.2 mg/dL 9.7  8.9  9.7   Total Protein 6.0 - 8.5 g/dL 7.3  7.4  7.3   Total Bilirubin  0.0 - 1.2 mg/dL <0.9  0.7  <8.1   Alkaline Phos 44 - 121 IU/L 88  73  89   AST 0 - 40 IU/L 19  19  12    ALT 0 - 32 IU/L 19  17  14     Lipid Panel     Component Value Date/Time   CHOL 159 06/02/2023 1650   TRIG 100 06/02/2023 1650   HDL 50 06/02/2023 1650   CHOLHDL 3.2 06/02/2023 1650   LDLCALC 91 06/02/2023 1650    CBC    Component Value Date/Time   WBC 6.7 09/24/2022 0932   RBC 4.79 09/24/2022 0932   HGB 13.6 09/24/2022 0932   HGB 13.0 05/24/2022 1629   HCT 39.9 09/24/2022 0932   HCT 37.9 05/24/2022 1629   PLT 245 09/24/2022 0932   PLT 305 05/24/2022 1629   MCV 83.3 09/24/2022 0932   MCV 82 05/24/2022 1629   MCH 28.4 09/24/2022 0932   MCHC 34.1 09/24/2022 0932   RDW 15.4 09/24/2022 0932   RDW 14.0 05/24/2022 1629    ASSESSMENT AND PLAN: 1. Type 2 diabetes mellitus with other specified complication, without long-term current use of insulin  (HCC) (Primary) A1c is at goal. Continue metformin  Patient advised to eliminate sugary drinks from the diet, cut back on portion sizes especially of white carbohydrates, eat more white lean meat like chicken [place] and seafood instead of beef or pork and incorporate fresh fruits and vegetables into the diet daily. Challenged her to increase her walking to 20 minutes daily.  - POCT glycosylated hemoglobin (Hb A1C) - POCT glucose (manual entry)  2. Obesity (BMI 30.0-34.9) See #1 above.  3. Hypertension associated with type 2 diabetes mellitus (HCC) At goal.  Continue amlodipine  10 mg daily and Diovan  40 mg daily  4. Hyperlipidemia associated with type 2 diabetes mellitus (HCC) Continue atorvastatin  10 mg daily  5. Tobacco dependence Strongly encouraged her to quit.  Commended her on cutting back.  Encouraged her to use the nicotine  patches consistently every day.  Patient was given the opportunity to ask questions.  Patient verbalized understanding of the plan and was able to repeat key elements of the plan.   This  documentation was completed using Paediatric nurse.  Any transcriptional errors are unintentional.  Orders Placed This Encounter  Procedures   POCT glycosylated hemoglobin (Hb A1C)   POCT glucose (manual entry)     Requested Prescriptions    No prescriptions requested or ordered in this encounter    Return in about 4 weeks (around 11/01/2023) for PAP.  Shelby Dee, MD, FACP

## 2023-10-26 ENCOUNTER — Ambulatory Visit (INDEPENDENT_AMBULATORY_CARE_PROVIDER_SITE_OTHER)

## 2023-10-26 ENCOUNTER — Ambulatory Visit (HOSPITAL_COMMUNITY)

## 2023-10-26 ENCOUNTER — Ambulatory Visit (HOSPITAL_COMMUNITY)
Admission: EM | Admit: 2023-10-26 | Discharge: 2023-10-26 | Disposition: A | Discharge status: Home / Self Care | Attending: Sports Medicine | Admitting: Sports Medicine

## 2023-10-26 ENCOUNTER — Other Ambulatory Visit (HOSPITAL_COMMUNITY): Payer: Self-pay

## 2023-10-26 ENCOUNTER — Encounter (HOSPITAL_COMMUNITY): Payer: Self-pay | Admitting: *Deleted

## 2023-10-26 DIAGNOSIS — R051 Acute cough: Secondary | ICD-10-CM

## 2023-10-26 DIAGNOSIS — J189 Pneumonia, unspecified organism: Secondary | ICD-10-CM | POA: Diagnosis not present

## 2023-10-26 MED ORDER — ALBUTEROL SULFATE HFA 108 (90 BASE) MCG/ACT IN AERS
2.0000 | INHALATION_SPRAY | Freq: Once | RESPIRATORY_TRACT | Status: AC
  Administered 2023-10-26: 2 via RESPIRATORY_TRACT

## 2023-10-26 MED ORDER — DOXYCYCLINE HYCLATE 100 MG PO CAPS
100.0000 mg | ORAL_CAPSULE | Freq: Two times a day (BID) | ORAL | 0 refills | Status: AC
  Filled 2023-10-26: qty 14, 7d supply, fill #0

## 2023-10-26 MED ORDER — ALBUTEROL SULFATE HFA 108 (90 BASE) MCG/ACT IN AERS
INHALATION_SPRAY | RESPIRATORY_TRACT | Status: AC
  Filled 2023-10-26: qty 6.7

## 2023-10-26 NOTE — ED Triage Notes (Addendum)
 Pt states that she has had cough which has caused SOB, sore throat, and chest hurting from coughing x 2 weeks. She has been using coricidin and cough drops.

## 2023-10-26 NOTE — ED Provider Notes (Signed)
 [facility]    CSN: [id number] Arrival date & time: 10/26/23  1191      History   Chief Complaint Chief Complaint  Patient presents with   Cough   Shortness of Breath   Sore Throat    HPI Shelby Wyatt is a 58 y.o. female with PMH of colon cancer, tobacco use, and prediabetes here with 2 weeks of cough, dyspnea and sore throat. She reports her symptoms have been progressively worsening over the past 2 weeks. Has tried cough drops and coricidin without improvement. + tobacco use, states 1/2 ppd for past 35 years. No history of asthma or COPD. Denies recent fevers, chills, N/V. No recent antibiotics.   Cough Associated symptoms: shortness of breath   Shortness of Breath Associated symptoms: cough   Sore Throat Associated symptoms include shortness of breath.    Past Medical History:  Diagnosis Date   Allergy    seasonal   Anemia    Cancer (HCC)    colon, cancer dec 2022   Hypertension    No pertinent past medical history    Pre-diabetes    Tubal pregnancy     Patient Active Problem List   Diagnosis Date Noted   Numbness of fingers of both hands 10/12/2021   Glaucoma suspect 05/27/2021   Serrated polyp of colon 04/02/2021   Hx of adenomatous colonic polyps 04/02/2021   Cecal polyp 04/02/2021   Abnormal colonoscopy 04/02/2021   Cough due to ACE inhibitor 03/05/2021   Obesity (BMI 30-39.9) 03/05/2021   Essential hypertension 12/25/2020   Prediabetes 12/25/2020   Perimenopausal 12/25/2020   Tobacco dependence 12/01/2020   Incontinence of feces 12/01/2020   Abdominal pain 12/01/2020    Past Surgical History:  Procedure Laterality Date   COLONOSCOPY     COLONOSCOPY WITH PROPOFOL  N/A 04/07/2021   Procedure: COLONOSCOPY WITH PROPOFOL ;  Surgeon: Normie Becton., MD;  Location: [facility];  Service: Gastroenterology;  Laterality: N/A;   ENDOSCOPIC MUCOSAL RESECTION N/A 04/07/2021   Procedure: ENDOSCOPIC MUCOSAL RESECTION;  Surgeon:  Brice Campi Albino Alu., MD;  Location: [facility];  Service: Gastroenterology;  Laterality: N/A;   HEMOSTASIS CLIP PLACEMENT  04/07/2021   Procedure: HEMOSTASIS CLIP PLACEMENT;  Surgeon: Normie Becton., MD;  Location: [facility];  Service: Gastroenterology;;   LAPAROSCOPIC RIGHT HEMI COLECTOMY Right 05/27/2021   Procedure: LAPAROSCOPIC RIGHT HEMI COLECTOMY;  Surgeon: Melvenia Stabs, MD;  Location: [facility];  Service: General;  Laterality: Right;   laporoscopy     MOUTH SURGERY     SUBMUCOSAL LIFTING INJECTION  04/07/2021   Procedure: SUBMUCOSAL LIFTING INJECTION;  Surgeon: Normie Becton., MD;  Location: [facility];  Service: Gastroenterology;;   TUBAL LIGATION     WISDOM TOOTH EXTRACTION      OB History     Gravida  2   Para      Term      Preterm      AB  2   Living         SAB      IAB      Ectopic  2   Multiple      Live Births               Home Medications    Prior to Admission medications   Medication Sig Start Date End Date Taking? Authorizing Provider  amLODipine  (NORVASC ) 10 MG tablet Take 1 tablet (10 mg total) by mouth daily. 06/02/23  Yes Lawrance Presume, MD  atorvastatin  (LIPITOR) 10 MG tablet Take 1 tablet (10 mg total) by mouth daily. 06/04/23  Yes Lawrance Presume, MD  doxycycline (VIBRAMYCIN) 100 MG capsule Take 1 capsule (100 mg total) by mouth 2 (two) times daily for 7 days. 10/26/23 11/02/23 Yes Jaspreet Bodner D, MD  metFORMIN  (GLUCOPHAGE ) 500 MG tablet Take 1 tablet (500 mg total) by mouth 2 (two) times daily with a meal. 06/02/23  Yes Lawrance Presume, MD  Multiple Vitamins-Minerals (MULTIVITAMIN WITH MINERALS) tablet Take 1 tablet by mouth daily. Centrum 50+   Yes [provider]  valsartan  (DIOVAN ) 40 MG tablet Take 1 tablet (40 mg total) by mouth daily. 06/02/23  Yes Lawrance Presume, MD  miconazole  (MICATIN) 2 % cream Apply 1 Application topically 2 (two) times daily. 09/17/22   Lawrance Presume, MD  naproxen  sodium (ALEVE ) 220 MG tablet Take 2 tablets (440 mg total) by mouth daily as needed (cramps). 04/21/21   Mansouraty, Albino Alu., MD  nicotine  (NICODERM CQ  - DOSED IN MG/24 HOURS) 14 mg/24hr patch Place 1 patch (14 mg total) onto the skin daily. 06/02/23   Lawrance Presume, MD    Family History Family History  Problem Relation Age of Onset   Hypertension Mother    Diabetes Mother    Breast cancer Mother    Hypertension Maternal Grandmother    Esophageal cancer Paternal Grandmother    Hypertension Paternal Grandmother    Hypertension Paternal Grandfather    Colon cancer Neg Hx    Rectal cancer Neg Hx    Stomach cancer Neg Hx    Inflammatory bowel disease Neg Hx    Liver disease Neg Hx    Pancreatic cancer Neg Hx    Colon polyps Neg Hx    Crohn's disease Neg Hx    Ulcerative colitis Neg Hx     Social History Social History   Tobacco Use   Smoking status: Every Day    Current packs/day: 0.25    Types: Cigarettes   Smokeless tobacco: Never  Vaping Use   Vaping status: Never Used  Substance Use Topics   Alcohol use: Yes    Comment: occasionally   Drug use: Yes    Types: Marijuana    Comment: Smoked at the beginning of month     Allergies   Augmentin  [amoxicillin -pot clavulanate], Lisinopril , and Terbinafine  and related   Review of Systems Review of Systems  Respiratory:  Positive for cough and shortness of breath.      Physical Exam Triage Vital Signs ED Triage Vitals  Encounter Vitals Group     BP 10/26/23 0820 132/76     Systolic BP Percentile --      Diastolic BP Percentile --      Pulse Rate 10/26/23 0820 85     Resp 10/26/23 0820 20     Temp 10/26/23 0820 98.1 F (36.7 C)     Temp Source 10/26/23 0820 Oral     SpO2 10/26/23 0820 95 %     Weight --      Height --      Head Circumference --      Peak Flow --      Pain Score 10/26/23 0818 8     Pain Loc --      Pain Education --      Exclude from Growth Chart --    No  data found.  Updated Vital Signs BP 132/76 (BP Location: Left Arm)   Pulse 85  Temp 98.1 F (36.7 C) (Oral)   Resp 20   SpO2 95%   Physical Exam Constitutional:      General: She is not in acute distress.    Appearance: She is well-developed. She is not toxic-appearing or diaphoretic.  HENT:     Head: Normocephalic and atraumatic.     Mouth/Throat:     Mouth: Mucous membranes are moist.     Pharynx: No pharyngeal swelling or oropharyngeal exudate.  Eyes:     Extraocular Movements: Extraocular movements intact.     Pupils: Pupils are equal, round, and reactive to light.  Cardiovascular:     Rate and Rhythm: Normal rate and regular rhythm.     Pulses: Normal pulses.     Heart sounds: Normal heart sounds.  Pulmonary:     Effort: Pulmonary effort is normal. No tachypnea.     Breath sounds: No stridor. Examination of the right-lower field reveals decreased breath sounds and rhonchi. Decreased breath sounds, wheezing and rhonchi present. No rales.     Comments: Frequent coughing. Faint expiratory wheezes throughout. Diminished breath and rhonchi in RLL. Chest:     Chest wall: Tenderness present. No deformity or crepitus.  Abdominal:     Palpations: Abdomen is soft.     Tenderness: There is no abdominal tenderness. There is no guarding.  Musculoskeletal:        General: Normal range of motion.     Cervical back: Normal range of motion and neck supple.     Right lower leg: No edema.     Left lower leg: No edema.  Lymphadenopathy:     Cervical: No cervical adenopathy.  Skin:    General: Skin is warm.     Capillary Refill: Capillary refill takes less than 2 seconds.  Neurological:     General: No focal deficit present.     Mental Status: She is alert and oriented to person, place, and time.  Psychiatric:        Mood and Affect: Mood normal.        Behavior: Behavior normal.    UC Treatments / Results  Labs (all labs ordered are listed, but only abnormal results are  displayed) Labs Reviewed - No data to display  EKG   Radiology DG Chest 2 View Result Date: 10/26/2023 CLINICAL DATA:  Cough, dyspnea. EXAM: CHEST - 2 VIEW COMPARISON:  Oct 20, 2018. FINDINGS: The heart size and mediastinal contours are within normal limits. Both lungs are clear. The visualized skeletal structures are unremarkable. IMPRESSION: No active cardiopulmonary disease. Electronically Signed   By: Rosalene Colon M.D.   On: 10/26/2023 08:52    Procedures Procedures (including critical care time)  Medications Ordered in UC Medications  albuterol (VENTOLIN HFA) 108 (90 Base) MCG/ACT inhaler 2 puff (2 puffs Inhalation Given 10/26/23 0834)    Initial Impression / Assessment and Plan / UC Course  I have reviewed the triage vital signs and the nursing notes.  Pertinent labs & imaging results that were available during my care of the patient were reviewed by me and considered in my medical decision making (see chart for details).    Patient is well-appearing, normotensive, afebrile, not tachycardic, not tachypneic, oxygenating well on room air.   Acute cough - Plan: DG Chest 2 View, DG Chest 2 View  Community acquired pneumonia of right lower lobe of lung On recheck after albuterol treatment she was moving air well and without any wheeze, though still rhoncherous in the RLL. CXR  reviewed and shows focal consolidation in RLLand perihilar thickening. No pleural effusion on my impression. Formal radiology read pending. Given her smoking history she was treated with an albuterol inhaler and discharged with this as well. Will treat the pneumonia with Doxycyline 100mg  BID x 7d. Supportive care discussed. Return and ER precautions discussed Patient's questions were answered and they are in agreement with this plan  Final Clinical Impressions(s) / UC Diagnoses   Final diagnoses:  Acute cough  Community acquired pneumonia of right lower lobe of lung     Discharge Instructions       You have pneumonia of the right lower lobe.  We will contact you if anything else returns on your chest x-ray after the radiologist reviews it. I have sent you an antibiotic (Doxycyline) to take twice daily for 7 days. Complete the full course of this even if you start feeling improved sooner. The inhaler can be continued up to every 4 hours if this is helpful. Follow-up with your primary care provider in 1-2 weeks if not improved. If your symptoms worsen return here.   ED Prescriptions     Medication Sig Dispense Auth. Provider   doxycycline (VIBRAMYCIN) 100 MG capsule Take 1 capsule (100 mg total) by mouth 2 (two) times daily for 7 days. 14 capsule Loi Rennaker D, MD      PDMP not reviewed this encounter.   Marliss Simple, MD 10/26/23 [phone]

## 2023-10-26 NOTE — Discharge Instructions (Addendum)
 You have pneumonia of the right lower lobe.  We will contact you if anything else returns on your chest x-ray after the radiologist reviews it. I have sent you an antibiotic (Doxycyline) to take twice daily for 7 days. Complete the full course of this even if you start feeling improved sooner. The inhaler can be continued up to every 4 hours if this is helpful. Follow-up with your primary care provider in 1-2 weeks if not improved. If your symptoms worsen return here.

## 2023-10-28 ENCOUNTER — Ambulatory Visit: Attending: Internal Medicine | Admitting: Internal Medicine

## 2023-10-28 ENCOUNTER — Other Ambulatory Visit (HOSPITAL_COMMUNITY): Payer: Self-pay

## 2023-10-28 ENCOUNTER — Encounter: Payer: Self-pay | Admitting: Internal Medicine

## 2023-10-28 VITALS — BP 126/67 | HR 85 | Temp 98.5°F | Ht 62.0 in | Wt 183.0 lb

## 2023-10-28 DIAGNOSIS — J40 Bronchitis, not specified as acute or chronic: Secondary | ICD-10-CM

## 2023-10-28 MED ORDER — BENZONATATE 100 MG PO CAPS
100.0000 mg | ORAL_CAPSULE | Freq: Three times a day (TID) | ORAL | 0 refills | Status: AC | PRN
  Filled 2023-10-28: qty 30, 10d supply, fill #0

## 2023-10-28 NOTE — Patient Instructions (Signed)
 VISIT SUMMARY:  During your visit, we discussed your persistent cough and symptoms following an urgent care visit for pneumonia. You have been experiencing a severe cough, congestion, shortness of breath, and a sore throat for the past two weeks. We reviewed your current medications and symptoms to adjust your treatment plan.  YOUR PLAN:  -ACUTE BRONCHITIS: Acute bronchitis is an inflammation of the bronchial tubes in the lungs, often causing coughing and congestion. To help manage your symptoms, I have prescribed Tessalon Perles to be taken three times daily as needed for your cough. Additionally, I recommend taking loratadine for 5-7 days to help with congestion. Please continue and complete your current course of doxycycline. I have also provided a work absence note for five days to allow you time to rest and recover.  INSTRUCTIONS:  Please follow up if your symptoms do not improve or if they worsen. Complete the full course of doxycycline as prescribed. Use Tessalon Perles as needed for your cough and take loratadine for 5-7 days for congestion. Rest and take care of yourself during this recovery period.

## 2023-10-28 NOTE — Progress Notes (Signed)
 [name], female    DOB: [birth year]  MRN: [id number]  CC: Follow-up (UC f/u. /On-going runny nose, congestion, cough, sore throat X3 weeks)   Subjective: Antha Dosier is a 58 y.o. female who presents for chronic ds management. Her concerns today include:  Tob dep, fecal incont, HTN, DM, obesity, colon CA (s/p RT hemicolectomy 05/2021),   Discussed the use of AI scribe software for clinical note transcription with the patient, who gave verbal consent to proceed.  History of Present Illness Shelby Wyatt is a 58 year old female who presents with a persistent cough following an urgent care visit for pneumonia.  She experienced extreme coughing, leading to an urgent care visit on Oct 26, 2023, where an x-ray was done and she was told she has right lower lobe pneumonia. She was prescribed doxycycline and an inhaler, currently on her second or third day of doxycycline use.  The official x-ray reading from the radiologist however showed no radiologic findings of pneumonia.  The cough has persisted for at least two weeks with occasional phlegm production. She experiences shortness of breath, congestion, and a sore throat that worsens with excessive coughing. The cough is severe enough to disrupt her sleep at night. No fever is present.  She has been taking Coricidin HBP for congestion and cough but reports no significant improvement with the current treatment regimen.  She was given 2 days off work from urgent care starting 10/26/2023.  She does not feel well enough to return to work as yet.  She states that I will also be receiving an FMLA form from [employer] to complete for time out of work for this acute event.    Patient Active Problem List   Diagnosis Date Noted   Numbness of fingers of both hands 10/12/2021   Glaucoma suspect 05/27/2021   Serrated polyp of colon 04/02/2021   Hx of adenomatous colonic polyps 04/02/2021   Cecal polyp 04/02/2021   Abnormal  colonoscopy 04/02/2021   Cough due to ACE inhibitor 03/05/2021   Obesity (BMI 30-39.9) 03/05/2021   Essential hypertension 12/25/2020   Prediabetes 12/25/2020   Perimenopausal 12/25/2020   Tobacco dependence 12/01/2020   Incontinence of feces 12/01/2020   Abdominal pain 12/01/2020     Current Outpatient Medications on File Prior to Visit  Medication Sig Dispense Refill   amLODipine  (NORVASC ) 10 MG tablet Take 1 tablet (10 mg total) by mouth daily. 90 tablet 1   atorvastatin  (LIPITOR) 10 MG tablet Take 1 tablet (10 mg total) by mouth daily. 90 tablet 3   doxycycline (VIBRAMYCIN) 100 MG capsule Take 1 capsule (100 mg total) by mouth 2 (two) times daily for 7 days. 14 capsule 0   metFORMIN  (GLUCOPHAGE ) 500 MG tablet Take 1 tablet (500 mg total) by mouth 2 (two) times daily with a meal. 180 tablet 1   miconazole  (MICATIN) 2 % cream Apply 1 Application topically 2 (two) times daily. 30 g 0   Multiple Vitamins-Minerals (MULTIVITAMIN WITH MINERALS) tablet Take 1 tablet by mouth daily. Centrum 50+     naproxen  sodium (ALEVE ) 220 MG tablet Take 2 tablets (440 mg total) by mouth daily as needed (cramps). 30 tablet 0   nicotine  (NICODERM CQ  - DOSED IN MG/24 HOURS) 14 mg/24hr patch Place 1 patch (14 mg total) onto the skin daily. 28 patch 2   valsartan  (DIOVAN ) 40 MG tablet Take 1 tablet (40 mg total) by mouth daily. 90 tablet 1   No current facility-administered  medications on file prior to visit.    Allergies  Allergen Reactions   Augmentin  [Amoxicillin -Pot Clavulanate] Other (See Comments)    Significant abdominal pain, diarrhea, following Augmentin  specifically (not penicillin allergy)   Lisinopril  Cough   Terbinafine  And Related Hives    Social History   Socioeconomic History   Marital status: Married    Spouse name: Not on file   Number of children: 0   Years of education: Not on file   Highest education level: Associate degree: occupational, Scientist, product/process development, or vocational program   Occupational History   Occupation: Food services - [employer]  Tobacco Use   Smoking status: Every Day    Current packs/day: 0.25    Types: Cigarettes   Smokeless tobacco: Never  Vaping Use   Vaping status: Never Used  Substance and Sexual Activity   Alcohol use: Yes    Comment: occasionally   Drug use: Yes    Types: Marijuana    Comment: Smoked at the beginning of month   Sexual activity: Yes    Birth control/protection: None  Other Topics Concern   Not on file  Social History Narrative   Not on file   Social Drivers of Health   Financial Resource Strain: Low Risk  (08/24/2023)   Overall Financial Resource Strain (CARDIA)    Difficulty of Paying Living Expenses: Not very hard  Food Insecurity: No Food Insecurity (08/24/2023)   Hunger Vital Sign    Worried About Running Out of Food in the Last Year: Never true    Ran Out of Food in the Last Year: Never true  Transportation Needs: No Transportation Needs (05/31/2023)   PRAPARE - Administrator, Civil Service (Medical): No    Lack of Transportation (Non-Medical): No  Physical Activity: Insufficiently Active (08/24/2023)   Exercise Vital Sign    Days of Exercise per Week: 2 days    Minutes of Exercise per Session: 30 min  Stress: No Stress Concern Present (08/24/2023)   [employer] of Occupational Health - Occupational Stress Questionnaire    Feeling of Stress : Not at all  Social Connections: Socially Integrated (08/24/2023)   Social Connection and Isolation Panel [NHANES]    Frequency of Communication with Friends and Family: Never    Frequency of Social Gatherings with Friends and Family: More than three times a week    Attends Religious Services: 1 to 4 times per year    Active Member of [employer] or Organizations: No    Attends Engineer, structural: 1 to 4 times per year    Marital Status: Married  Catering manager Violence: Not At Risk (08/24/2023)   Humiliation, Afraid, Rape, and Kick questionnaire     Fear of Current or Ex-Partner: No    Emotionally Abused: No    Physically Abused: No    Sexually Abused: No    Family History  Problem Relation Age of Onset   Hypertension Mother    Diabetes Mother    Breast cancer Mother    Hypertension Maternal Grandmother    Esophageal cancer Paternal Grandmother    Hypertension Paternal Grandmother    Hypertension Paternal Grandfather    Colon cancer Neg Hx    Rectal cancer Neg Hx    Stomach cancer Neg Hx    Inflammatory bowel disease Neg Hx    Liver disease Neg Hx    Pancreatic cancer Neg Hx    Colon polyps Neg Hx    Crohn's disease Neg Hx  Ulcerative colitis Neg Hx     Past Surgical History:  Procedure Laterality Date   COLONOSCOPY     COLONOSCOPY WITH PROPOFOL  N/A 04/07/2021   Procedure: COLONOSCOPY WITH PROPOFOL ;  Surgeon: Mansouraty, Albino Alu., MD;  Location: [facility];  Service: Gastroenterology;  Laterality: N/A;   ENDOSCOPIC MUCOSAL RESECTION N/A 04/07/2021   Procedure: ENDOSCOPIC MUCOSAL RESECTION;  Surgeon: Brice Campi Albino Alu., MD;  Location: [facility];  Service: Gastroenterology;  Laterality: N/A;   HEMOSTASIS CLIP PLACEMENT  04/07/2021   Procedure: HEMOSTASIS CLIP PLACEMENT;  Surgeon: Normie Becton., MD;  Location: [facility];  Service: Gastroenterology;;   LAPAROSCOPIC RIGHT HEMI COLECTOMY Right 05/27/2021   Procedure: LAPAROSCOPIC RIGHT HEMI COLECTOMY;  Surgeon: Melvenia Stabs, MD;  Location: [facility];  Service: General;  Laterality: Right;   laporoscopy     MOUTH SURGERY     SUBMUCOSAL LIFTING INJECTION  04/07/2021   Procedure: SUBMUCOSAL LIFTING INJECTION;  Surgeon: Normie Becton., MD;  Location: [facility];  Service: Gastroenterology;;   TUBAL LIGATION     WISDOM TOOTH EXTRACTION      ROS: Review of Systems Negative except as stated above  PHYSICAL EXAM: BP 126/67 (BP Location: Left Arm, Patient Position: Sitting, Cuff Size: Normal)   Pulse 85   Temp 98.5 F (36.9 C)  (Oral)   Ht 5\' 2"  (1.575 m)   Wt 183 lb (83 kg)   SpO2 98%   BMI 33.47 kg/m   Physical Exam  General appearance - alert, well appearing, older AAF  in no distress.  She has audible congestion and intermittent coughing spasms in my presence. Mental status - normal mood, behavior, speech, dress, motor activity, and thought processes Nose -moderate enlargement of nasal turbinates Mouth -throat is without erythema or exudates Neck - supple, no significant adenopathy Chest - clear to auscultation, no wheezes, rales or rhonchi, symmetric air entry Heart - normal rate, regular rhythm, normal S1, S2, no murmurs, rubs, clicks or gallops      Latest Ref Rng & Units 06/02/2023    4:50 PM 09/24/2022    9:32 AM 05/24/2022    4:29 PM  CMP  Glucose 70 - 99 mg/dL 161  096  88   BUN 6 - 24 mg/dL 12  10  12    Creatinine 0.57 - 1.00 mg/dL 0.45  4.09  8.11   Sodium 134 - 144 mmol/L 142  138  136   Potassium 3.5 - 5.2 mmol/L 3.7  3.9  4.2   Chloride 96 - 106 mmol/L 103  108  102   CO2 20 - 29 mmol/L 23  23  22    Calcium  8.7 - 10.2 mg/dL 9.7  8.9  9.7   Total Protein 6.0 - 8.5 g/dL 7.3  7.4  7.3   Total Bilirubin 0.0 - 1.2 mg/dL <9.1  0.7  <4.7   Alkaline Phos 44 - 121 IU/L 88  73  89   AST 0 - 40 IU/L 19  19  12    ALT 0 - 32 IU/L 19  17  14     Lipid Panel     Component Value Date/Time   CHOL 159 06/02/2023 1650   TRIG 100 06/02/2023 1650   HDL 50 06/02/2023 1650   CHOLHDL 3.2 06/02/2023 1650   LDLCALC 91 06/02/2023 1650    CBC    Component Value Date/Time   WBC 6.7 09/24/2022 0932   RBC 4.79 09/24/2022 0932   HGB 13.6 09/24/2022 0932   HGB 13.0 05/24/2022  1629   HCT 39.9 09/24/2022 0932   HCT 37.9 05/24/2022 1629   PLT 245 09/24/2022 0932   PLT 305 05/24/2022 1629   MCV 83.3 09/24/2022 0932   MCV 82 05/24/2022 1629   MCH 28.4 09/24/2022 0932   MCHC 34.1 09/24/2022 0932   RDW 15.4 09/24/2022 0932   RDW 14.0 05/24/2022 1629    ASSESSMENT AND PLAN: 1. Bronchitis  (Primary) Most likely viral but we will have her complete the antibiotics that she was prescribed from urgent care.  I have given some Tessalon Perles for her to use as needed for the cough.  I recommend purchasing some Claritin to help decrease the congestion.  I have extended her time out of work until the 19th of this month.  She will return to work on 11/01/2023.  I will complete FMLA form once it is received. - benzonatate (TESSALON PERLES) 100 MG capsule; Take 1 capsule (100 mg total) by mouth 3 (three) times daily as needed.  Dispense: 30 capsule; Refill: 0    Patient was given the opportunity to ask questions.  Patient verbalized understanding of the plan and was able to repeat key elements of the plan.   This documentation was completed using Paediatric nurse.  Any transcriptional errors are unintentional.  No orders of the defined types were placed in this encounter.    Requested Prescriptions   Signed Prescriptions Disp Refills   benzonatate (TESSALON PERLES) 100 MG capsule 30 capsule 0    Sig: Take 1 capsule (100 mg total) by mouth 3 (three) times daily as needed.    No follow-ups on file.  Concetta Dee, MD, FACP

## 2023-11-08 ENCOUNTER — Ambulatory Visit: Payer: Commercial Managed Care - PPO | Admitting: Podiatry

## 2023-11-16 ENCOUNTER — Other Ambulatory Visit (HOSPITAL_COMMUNITY): Payer: Self-pay

## 2023-11-16 ENCOUNTER — Ambulatory Visit (INDEPENDENT_AMBULATORY_CARE_PROVIDER_SITE_OTHER): Payer: Commercial Managed Care - PPO | Admitting: Podiatry

## 2023-11-16 ENCOUNTER — Ambulatory Visit: Attending: Nurse Practitioner | Admitting: Nurse Practitioner

## 2023-11-16 ENCOUNTER — Encounter: Payer: Self-pay | Admitting: Podiatry

## 2023-11-16 ENCOUNTER — Ambulatory Visit: Payer: Self-pay

## 2023-11-16 ENCOUNTER — Encounter: Payer: Self-pay | Admitting: Nurse Practitioner

## 2023-11-16 VITALS — BP 123/69 | HR 92 | Ht 62.0 in | Wt 184.4 lb

## 2023-11-16 DIAGNOSIS — E119 Type 2 diabetes mellitus without complications: Secondary | ICD-10-CM

## 2023-11-16 DIAGNOSIS — M79674 Pain in right toe(s): Secondary | ICD-10-CM | POA: Diagnosis not present

## 2023-11-16 DIAGNOSIS — B351 Tinea unguium: Secondary | ICD-10-CM | POA: Diagnosis not present

## 2023-11-16 DIAGNOSIS — Q828 Other specified congenital malformations of skin: Secondary | ICD-10-CM

## 2023-11-16 DIAGNOSIS — L304 Erythema intertrigo: Secondary | ICD-10-CM | POA: Diagnosis not present

## 2023-11-16 DIAGNOSIS — M791 Myalgia, unspecified site: Secondary | ICD-10-CM | POA: Diagnosis not present

## 2023-11-16 DIAGNOSIS — L84 Corns and callosities: Secondary | ICD-10-CM | POA: Diagnosis not present

## 2023-11-16 DIAGNOSIS — D72829 Elevated white blood cell count, unspecified: Secondary | ICD-10-CM

## 2023-11-16 DIAGNOSIS — M79675 Pain in left toe(s): Secondary | ICD-10-CM | POA: Diagnosis not present

## 2023-11-16 MED ORDER — NAPROXEN 500 MG PO TABS
500.0000 mg | ORAL_TABLET | Freq: Two times a day (BID) | ORAL | 0 refills | Status: DC
  Filled 2023-11-16: qty 60, 30d supply, fill #0

## 2023-11-16 MED ORDER — METHOCARBAMOL 500 MG PO TABS
500.0000 mg | ORAL_TABLET | Freq: Four times a day (QID) | ORAL | 0 refills | Status: DC
  Filled 2023-11-16: qty 60, 15d supply, fill #0

## 2023-11-16 MED ORDER — KETOCONAZOLE 2 % EX CREA
1.0000 | TOPICAL_CREAM | Freq: Every day | CUTANEOUS | 0 refills | Status: DC
  Filled 2023-11-16: qty 15, 15d supply, fill #0

## 2023-11-16 NOTE — Progress Notes (Signed)
 Assessment & Plan:  [name] was seen today for muscle pain.  Diagnoses and all orders for this visit:  Intertrigo -     ketoconazole (NIZORAL) 2 % cream; Apply 1 Application topically daily.  Leukocytosis, unspecified type -     CBC with Differential  Myalgia -     methocarbamol (ROBAXIN) 500 MG tablet; Take 1 tablet (500 mg total) by mouth 4 (four) times daily. -     naproxen  (NAPROSYN ) 500 MG tablet; Take 1 tablet (500 mg total) by mouth 2 (two) times daily with a meal.    Patient has been counseled on age-appropriate routine health concerns for screening and prevention. These are reviewed and up-to-date. Referrals have been placed accordingly. Immunizations are up-to-date or declined.    Subjective:   Chief Complaint  Patient presents with   Muscle Pain    Shelby Wyatt 58 y.o. female presents to office today with complaints of pain under breasts and "bruising" under left breast   She states she was lifting 3 heavy boxes together to put in the freezer at work and felt like she pulled a muscle under her breasts and in her torso. She had 2 dark areas under breast that she assumed were bruises. She put tiger balm and icy hot under her breasts for the pain and the darkness went away under the right breast but remained under the left. She has been taking aleve  for pain which relieves pain from 9/10 to 5-7/10.      Review of Systems  Constitutional:  Negative for fever, malaise/fatigue and weight loss.  HENT: Negative.  Negative for nosebleeds.   Eyes: Negative.  Negative for blurred vision, double vision and photophobia.  Respiratory: Negative.  Negative for cough and shortness of breath.   Cardiovascular: Negative.  Negative for chest pain, palpitations and leg swelling.  Gastrointestinal: Negative.  Negative for heartburn, nausea and vomiting.  Musculoskeletal: Negative.  Negative for myalgias.  Skin:  Positive for rash.  Neurological: Negative.  Negative for  dizziness, focal weakness, seizures and headaches.  Psychiatric/Behavioral: Negative.  Negative for suicidal ideas.     Past Medical History:  Diagnosis Date   Allergy    seasonal   Anemia    Cancer (HCC)    colon, cancer dec 2022   Hypertension    No pertinent past medical history    Pre-diabetes    Tubal pregnancy     Past Surgical History:  Procedure Laterality Date   COLON SURGERY  12/22   Right hemicolectomy   COLONOSCOPY     COLONOSCOPY WITH PROPOFOL  N/A 04/07/2021   Procedure: COLONOSCOPY WITH PROPOFOL ;  Surgeon: Brice Campi Albino Alu., MD;  Location: [facility];  Service: Gastroenterology;  Laterality: N/A;   ENDOSCOPIC MUCOSAL RESECTION N/A 04/07/2021   Procedure: ENDOSCOPIC MUCOSAL RESECTION;  Surgeon: Brice Campi Albino Alu., MD;  Location: [facility];  Service: Gastroenterology;  Laterality: N/A;   HEMOSTASIS CLIP PLACEMENT  04/07/2021   Procedure: HEMOSTASIS CLIP PLACEMENT;  Surgeon: Normie Becton., MD;  Location: [facility];  Service: Gastroenterology;;   LAPAROSCOPIC RIGHT HEMI COLECTOMY Right 05/27/2021   Procedure: LAPAROSCOPIC RIGHT HEMI COLECTOMY;  Surgeon: Melvenia Stabs, MD;  Location: [facility];  Service: General;  Laterality: Right;   laporoscopy     MOUTH SURGERY     SUBMUCOSAL LIFTING INJECTION  04/07/2021   Procedure: SUBMUCOSAL LIFTING INJECTION;  Surgeon: Normie Becton., MD;  Location: [facility];  Service: Gastroenterology;;   TUBAL LIGATION     WISDOM TOOTH  EXTRACTION      Family History  Problem Relation Age of Onset   Hypertension Mother    Diabetes Mother    Breast cancer Mother    Arthritis Mother    Hypertension Maternal Grandmother    Stroke Maternal Grandmother    Esophageal cancer Paternal Grandmother    Hypertension Paternal Grandmother    Cancer Paternal Grandmother    Hypertension Paternal Grandfather    Learning disabilities Sister    Colon cancer Neg Hx    Rectal cancer Neg Hx    Stomach  cancer Neg Hx    Inflammatory bowel disease Neg Hx    Liver disease Neg Hx    Pancreatic cancer Neg Hx    Colon polyps Neg Hx    Crohn's disease Neg Hx    Ulcerative colitis Neg Hx     Social History Reviewed with no changes to be made today.   Outpatient Medications Prior to Visit  Medication Sig Dispense Refill   amLODipine  (NORVASC ) 10 MG tablet Take 1 tablet (10 mg total) by mouth daily. 90 tablet 1   atorvastatin  (LIPITOR) 10 MG tablet Take 1 tablet (10 mg total) by mouth daily. 90 tablet 3   benzonatate  (TESSALON  PERLES) 100 MG capsule Take 1 capsule (100 mg total) by mouth 3 (three) times daily as needed. 30 capsule 0   metFORMIN  (GLUCOPHAGE ) 500 MG tablet Take 1 tablet (500 mg total) by mouth 2 (two) times daily with a meal. 180 tablet 1   miconazole  (MICATIN) 2 % cream Apply 1 Application topically 2 (two) times daily. 30 g 0   Multiple Vitamins-Minerals (MULTIVITAMIN WITH MINERALS) tablet Take 1 tablet by mouth daily. Centrum 50+     naproxen  sodium (ALEVE ) 220 MG tablet Take 2 tablets (440 mg total) by mouth daily as needed (cramps). 30 tablet 0   nicotine  (NICODERM CQ  - DOSED IN MG/24 HOURS) 14 mg/24hr patch Place 1 patch (14 mg total) onto the skin daily. 28 patch 2   valsartan  (DIOVAN ) 40 MG tablet Take 1 tablet (40 mg total) by mouth daily. 90 tablet 1   No facility-administered medications prior to visit.    Allergies  Allergen Reactions   Augmentin  [Amoxicillin -Pot Clavulanate] Other (See Comments)    Significant abdominal pain, diarrhea, following Augmentin  specifically (not penicillin allergy)   Lisinopril  Cough   Terbinafine  And Related Hives       Objective:    BP 123/69 (BP Location: Left Arm, Patient Position: Sitting, Cuff Size: Normal)   Pulse 92   Ht 5\' 2"  (1.575 m)   Wt 184 lb 6.4 oz (83.6 kg)   SpO2 98%   BMI 33.73 kg/m  Wt Readings from Last 3 Encounters:  11/16/23 184 lb 6.4 oz (83.6 kg)  10/28/23 183 lb (83 kg)  10/04/23 182 lb 3.2 oz  (82.6 kg)    Physical Exam Vitals and nursing note reviewed.  Constitutional:      Appearance: She is well-developed.  HENT:     Head: Normocephalic and atraumatic.  Cardiovascular:     Rate and Rhythm: Normal rate and regular rhythm.     Heart sounds: Normal heart sounds. No murmur heard.    No friction rub. No gallop.  Pulmonary:     Effort: Pulmonary effort is normal. No tachypnea or respiratory distress.     Breath sounds: Normal breath sounds. No decreased breath sounds, wheezing, rhonchi or rales.  Chest:     Chest wall: Tenderness present.    Abdominal:  General: Bowel sounds are normal.     Palpations: Abdomen is soft.  Musculoskeletal:        General: Normal range of motion.     Cervical back: Normal range of motion.  Skin:    General: Skin is warm and dry.     Findings: Rash present. Rash is macular.  Neurological:     Mental Status: She is alert and oriented to person, place, and time.     Coordination: Coordination normal.  Psychiatric:        Behavior: Behavior normal. Behavior is cooperative.        Thought Content: Thought content normal.        Judgment: Judgment normal.          Patient has been counseled extensively about nutrition and exercise as well as the importance of adherence with medications and regular follow-up. The patient was given clear instructions to go to ER or return to medical center if symptoms don't improve, worsen or new problems develop. The patient verbalized understanding.   Follow-up: Return if symptoms worsen or fail to improve.   Collins Dean, FNP-BC [facility] [place], [place] [phone]   11/16/2023, 2:59 PM

## 2023-11-16 NOTE — Telephone Encounter (Signed)
 FYI Only or Action Required?: FYI only for provider  Patient was last seen in primary care on 10/28/2023 by Lawrance Presume, MD. Called Nurse Triage reporting Chest Pain. Symptoms began several weeks ago. Interventions attempted: OTC medications: Tylenol , Rest, hydration, or home remedies, and Ice/heat application. Symptoms are: gradually worsening.  Triage Disposition: See PCP When Office is Open (Within 3 Days)  Patient/caregiver understands and will follow disposition?: Yes         Copied From CRM [id number]. Reason for Triage: Patient is calling in because she believes she pulled a muscle in her chest. Patient says she only feels pain if she's moving around. No shortness of breath or tightness, no other symptoms. Patient wants to know what she should do. She also says there is a dark scar under her breast.   Reason for Disposition  [1] Chest pain(s) lasting a few seconds AND [2] persists > 3 days  Answer Assessment - Initial Assessment Questions 1. LOCATION: "Where does it hurt?"       L chest 2. RADIATION: "Does the pain go anywhere else?" (e.g., into neck, jaw, arms, back)     No  3. ONSET: "When did the chest pain begin?" (Minutes, hours or days)      2 wks - started getting better (using Tiger Balm and Icy/Hot). Then, pt states last night she applied Icy/Hot and it started burning worse  4. PATTERN: "Does the pain come and go, or has it been constant since it started?"  "Does it get worse with exertion?"      Always there 5. DURATION: "How long does it last" (e.g., seconds, minutes, hours)     Constant  6. SEVERITY: "How bad is the pain?"  (e.g., Scale 1-10; mild, moderate, or severe)    - MILD (1-3): doesn't interfere with normal activities     - MODERATE (4-7): interferes with normal activities or awakens from sleep    - SEVERE (8-10): excruciating pain, unable to do any normal activities       9/10 7. CARDIAC RISK FACTORS: "Do you have any history of heart problems or  risk factors for heart disease?" (e.g., angina, prior heart attack; diabetes, high blood pressure, high cholesterol, smoker, or strong family history of heart disease)     HTN 8. PULMONARY RISK FACTORS: "Do you have any history of lung disease?"  (e.g., blood clots in lung, asthma, emphysema, birth control pills)     no 9. CAUSE: "What do you think is causing the chest pain?"     Pulled muscle 10. OTHER SYMPTOMS: "Do you have any other symptoms?" (e.g., dizziness, nausea, vomiting, sweating, fever, difficulty breathing, cough)       "Really dark bruise" to her chest, under her L breast where her bra lies  "I had lifted some boxes at work and carried them, when I let them go it felt funny, I coughed and pulled a muscle"   Denies difficulty breathing; "if I take a deep breath and inhale too hard, I can feel the pain then"  Using tiger balm, icy/hot, aleve  (helps)  Protocols used: Chest Pain-A-AH

## 2023-11-17 ENCOUNTER — Ambulatory Visit: Payer: Self-pay | Admitting: Nurse Practitioner

## 2023-11-17 LAB — CBC WITH DIFFERENTIAL/PLATELET
Basophils Absolute: 0 10*3/uL (ref 0.0–0.2)
Basos: 0 %
EOS (ABSOLUTE): 0.2 10*3/uL (ref 0.0–0.4)
Eos: 2 %
Hematocrit: 41 % (ref 34.0–46.6)
Hemoglobin: 13.3 g/dL (ref 11.1–15.9)
Immature Grans (Abs): 0 10*3/uL (ref 0.0–0.1)
Immature Granulocytes: 0 %
Lymphocytes Absolute: 3.2 10*3/uL — ABNORMAL HIGH (ref 0.7–3.1)
Lymphs: 39 %
MCH: 27.6 pg (ref 26.6–33.0)
MCHC: 32.4 g/dL (ref 31.5–35.7)
MCV: 85 fL (ref 79–97)
Monocytes Absolute: 0.7 10*3/uL (ref 0.1–0.9)
Monocytes: 8 %
Neutrophils Absolute: 4.2 10*3/uL (ref 1.4–7.0)
Neutrophils: 51 %
Platelets: 269 10*3/uL (ref 150–450)
RBC: 4.82 x10E6/uL (ref 3.77–5.28)
RDW: 14.5 % (ref 11.7–15.4)
WBC: 8.2 10*3/uL (ref 3.4–10.8)

## 2023-11-21 ENCOUNTER — Other Ambulatory Visit (HOSPITAL_COMMUNITY): Payer: Self-pay | Admitting: Occupational Medicine

## 2023-11-21 ENCOUNTER — Ambulatory Visit (HOSPITAL_COMMUNITY)
Admission: RE | Admit: 2023-11-21 | Discharge: 2023-11-21 | Disposition: A | Discharge status: Home / Self Care | Source: Ambulatory Visit | Attending: Occupational Medicine | Admitting: Occupational Medicine

## 2023-11-21 DIAGNOSIS — R0789 Other chest pain: Secondary | ICD-10-CM | POA: Insufficient documentation

## 2023-11-23 NOTE — Progress Notes (Signed)
 Subjective:  [name], female    DOB: Oct 14, 1965,  MRN: [id number]  Shelby Wyatt presents to clinic today for preventative diabetic foot care and callus(es) of both feet, porokeratotic lesion(s) right foot, and painful mycotic nails. Painful toenails interfere with ambulation. Aggravating factors include wearing enclosed shoe gear. Pain is relieved with periodic professional debridement. Painful callus(es) and porokeratotic lesion(s) are aggravated when weightbearing with and without shoegear. Pain is relieved with periodic professional debridement.  Chief Complaint  Patient presents with   Diabetic Foot Care    Rm 17/ DFC/ A1C 6.3/Last visit May 2025   New problem(s): None.   PCP is Lawrance Presume, MD.  Allergies  Allergen Reactions   Augmentin  [Amoxicillin -Pot Clavulanate] Other (See Comments)    Significant abdominal pain, diarrhea, following Augmentin  specifically (not penicillin allergy)   Lisinopril  Cough   Terbinafine  And Related Hives    Review of Systems: Negative except as noted in the HPI.  Objective: No changes noted in today's physical examination. There were no vitals filed for this visit. RICHELLE GLICK is a pleasant 58 y.o. female WD, WN in NAD. AAO x 3.  Vascular Examination: Vascular status intact b/l with palpable pedal pulses. CFT immediate b/l. Pedal hair present. No edema. No pain with calf compression b/l. Skin temperature gradient WNL b/l. No varicosities noted. No cyanosis or clubbing noted.  Neurological Examination: Sensation grossly intact b/l with 10 gram monofilament. Vibratory sensation intact b/l.  Dermatological Examination: Pedal skin with normal turgor, texture and tone b/l. No open wounds nor interdigital macerations noted. Toenails 1-5 b/l thick, discolored, elongated with subungual debris and pain on dorsal palpation.   Hyperkeratotic lesion(s) medial IPJ of left great toe and medial IPJ of right great  toe.  No erythema, no edema, no drainage, no fluctuance. Porokeratotic lesion(s) submet head 4 left foot. No erythema, no edema, no drainage, no fluctuance.  Musculoskeletal Examination: Muscle strength 5/5 to b/l LE.  No pain, crepitus noted b/l. No gross pedal deformities. Patient ambulates independently without assistive aids.   Radiographs: None  Lab Results  Component Value Date   HGBA1C 6.3 10/04/2023   HGBA1C 6.6 (H) 06/02/2023   HGBA1C 6.1 10/20/2022   Assessment/Plan: 1. Pain due to onychomycosis of toenails of both feet   2. Porokeratosis   3. Controlled type 2 diabetes mellitus without complication, without long-term current use of insulin  [facility])   -Patient was evaluated today. All questions/concerns addressed on today's visit. -Toenails 1-5 b/l were debrided in length and girth with sterile nail nippers and dremel without iatrogenic bleeding.  -Callus(es) medial IPJ of left great toe and medial IPJ of right great toe pared utilizing sterile scalpel blade without complication or incident. Total number debrided =2. -Porokeratotic lesion(s) submet head 4 left foot pared and enucleated with sterile currette without incident. Total number of lesions debrided=1. -Offloaded callus(es)/porokeratotic lesion(s) submet head 4 left foot with felt dancers pad applied to insole(s) of shoe. -Patient/POA to call should there be question/concern in the interim.   Return in about 3 months (around 02/16/2024).  Luella Sager, DPM      [place] LOCATION: [address], [place] [address]  Office [phone]   [facility] LOCATION: [address] [place], [place] [address] Office [phone]

## 2023-11-28 ENCOUNTER — Other Ambulatory Visit (HOSPITAL_COMMUNITY): Payer: Self-pay

## 2023-11-28 MED ORDER — METHOCARBAMOL 500 MG PO TABS
500.0000 mg | ORAL_TABLET | Freq: Three times a day (TID) | ORAL | 0 refills | Status: AC | PRN
  Filled 2023-11-28: qty 60, 20d supply, fill #0

## 2023-11-28 MED ORDER — NAPROXEN 500 MG PO TABS
500.0000 mg | ORAL_TABLET | Freq: Two times a day (BID) | ORAL | 0 refills | Status: AC | PRN
  Filled 2023-12-15: qty 60, 30d supply, fill #0

## 2023-12-15 ENCOUNTER — Other Ambulatory Visit: Payer: Self-pay | Admitting: Internal Medicine

## 2023-12-15 DIAGNOSIS — I10 Essential (primary) hypertension: Secondary | ICD-10-CM

## 2023-12-15 DIAGNOSIS — R7303 Prediabetes: Secondary | ICD-10-CM

## 2023-12-17 ENCOUNTER — Other Ambulatory Visit (HOSPITAL_COMMUNITY): Payer: Self-pay

## 2023-12-19 ENCOUNTER — Other Ambulatory Visit (HOSPITAL_COMMUNITY): Payer: Self-pay

## 2023-12-19 ENCOUNTER — Other Ambulatory Visit: Payer: Self-pay

## 2023-12-19 MED ORDER — METFORMIN HCL 500 MG PO TABS
500.0000 mg | ORAL_TABLET | Freq: Two times a day (BID) | ORAL | 1 refills | Status: DC
  Filled 2023-12-19: qty 180, 90d supply, fill #0
  Filled 2024-04-14: qty 180, 90d supply, fill #1

## 2023-12-19 MED ORDER — AMLODIPINE BESYLATE 10 MG PO TABS
10.0000 mg | ORAL_TABLET | Freq: Every day | ORAL | 1 refills | Status: DC
  Filled 2023-12-19: qty 90, 90d supply, fill #0
  Filled 2024-04-14: qty 90, 90d supply, fill #1

## 2023-12-19 MED ORDER — VALSARTAN 40 MG PO TABS
40.0000 mg | ORAL_TABLET | Freq: Every day | ORAL | 1 refills | Status: DC
  Filled 2023-12-19: qty 90, 90d supply, fill #0
  Filled 2024-04-14: qty 90, 90d supply, fill #1

## 2023-12-26 ENCOUNTER — Other Ambulatory Visit (HOSPITAL_COMMUNITY): Payer: Self-pay

## 2023-12-26 MED ORDER — METHYLPREDNISOLONE 4 MG PO TBPK
ORAL_TABLET | ORAL | 0 refills | Status: AC
Start: 2023-12-26 — End: ?
  Filled 2023-12-26: qty 21, 6d supply, fill #0

## 2023-12-29 ENCOUNTER — Other Ambulatory Visit (HOSPITAL_COMMUNITY): Payer: Self-pay

## 2024-02-10 ENCOUNTER — Telehealth: Payer: Self-pay | Admitting: Internal Medicine

## 2024-02-10 DIAGNOSIS — R7303 Prediabetes: Secondary | ICD-10-CM

## 2024-02-10 MED ORDER — METFORMIN HCL 500 MG PO TABS: 500.0000 mg | ORAL_TABLET | Freq: Two times a day (BID) | ORAL | 0 refills | Status: DC

## 2024-02-10 NOTE — Telephone Encounter (Addendum)
 Patient called the office from [facility] stating she left her Metformin  at home. She will return on Monday and is requesting a 3-day supply of the medication to be sent to the pharmacy,   [facility]:  [address] [place], [place] [address]

## 2024-02-10 NOTE — Addendum Note (Signed)
 Addended by: VICCI SOBER B on: 02/10/2024 10:52 PM   Modules accepted: Orders

## 2024-02-14 NOTE — Telephone Encounter (Unsigned)
 Copied from CRM [id number]. Topic: Clinical - Medication Question >> Feb 10, 2024  5:23 PM Dedra B wrote: Reason for CRM: Pt is out of town and calling to follow up on request for 3 day metformin  supply. Pls call pt.

## 2024-02-14 NOTE — Telephone Encounter (Signed)
 Called & spoke to the patient. Verified name & DOB. Informed that refill has been sent to requested pharmacy in [place]. Patient expressed verbal understanding.

## 2024-02-21 ENCOUNTER — Ambulatory Visit: Payer: Commercial Managed Care - PPO | Admitting: Podiatry

## 2024-02-21 ENCOUNTER — Encounter: Payer: Self-pay | Admitting: Podiatry

## 2024-02-21 DIAGNOSIS — B351 Tinea unguium: Secondary | ICD-10-CM

## 2024-02-21 DIAGNOSIS — M79674 Pain in right toe(s): Secondary | ICD-10-CM | POA: Diagnosis not present

## 2024-02-21 DIAGNOSIS — B353 Tinea pedis: Secondary | ICD-10-CM | POA: Diagnosis not present

## 2024-02-21 DIAGNOSIS — L304 Erythema intertrigo: Secondary | ICD-10-CM

## 2024-02-21 DIAGNOSIS — M79675 Pain in left toe(s): Secondary | ICD-10-CM | POA: Diagnosis not present

## 2024-02-21 MED ORDER — KETOCONAZOLE 2 % EX CREA: 1.0000 | TOPICAL_CREAM | Freq: Every day | CUTANEOUS | 0 refills | Status: AC

## 2024-02-26 NOTE — Progress Notes (Signed)
  Subjective:  [name], female    DOB: 13-Jun-1966,  MRN: [id number]  Shelby Wyatt presents to clinic today for painful thick toenails that are difficult to trim. Pain interferes with ambulation. Aggravating factors include wearing enclosed shoe gear. Pain is relieved with periodic professional debridement.  Chief Complaint  Patient presents with   Nail Problem    Thick painful toenails, 3 month follow up    New problem(s): None.   PCP is Vicci Barnie NOVAK, MD.  Allergies  Allergen Reactions   Augmentin  [Amoxicillin -Pot Clavulanate] Other (See Comments)    Significant abdominal pain, diarrhea, following Augmentin  specifically (not penicillin allergy)   Lisinopril  Cough   Terbinafine  And Related Hives    Review of Systems: Negative except as noted in the HPI.  Objective:  There were no vitals filed for this visit. Shelby Wyatt is a pleasant 58 y.o. female WD, WN in NAD. AAO x 3.  Vascular Examination: Vascular status intact b/l with palpable pedal pulses. CFT immediate b/l. Pedal hair present. No edema. No pain with calf compression b/l. Skin temperature gradient WNL b/l. No varicosities noted. No cyanosis or clubbing noted.  Neurological Examination: Sensation grossly intact b/l with 10 gram monofilament. Vibratory sensation intact b/l.  Dermatological Examination: Pedal skin with normal turgor and tone b/l. No open wounds nor interdigital macerations noted. Toenails 1-5 b/l thick, discolored, elongated with subungual debris and pain on dorsal palpation.   Diffuse scaling noted peripherally and plantarly b/l feet.  No interdigital macerations.  No blisters, no weeping. No signs of secondary bacterial infection noted.  Resolved hyperkeratotic lesion(s) medial IPJ of left great toe and medial IPJ of right great toe and resolved porokeratotic lesion(s) submet head 4 left foot. No erythema, no edema, no drainage, no  fluctuance.  Musculoskeletal Examination: Muscle strength 5/5 to b/l LE.  No pain, crepitus noted b/l. No gross pedal deformities. Patient ambulates independently without assistive aids.   Radiographs: None  Assessment/Plan: 1. Tinea pedis of both feet   2. Pain due to onychomycosis of toenails of both feet     Meds ordered this encounter  Medications   ketoconazole  (NIZORAL ) 2 % cream    Sig: Apply 1 Application topically daily.    Dispense:  15 g    Refill:  0  Consent given for treatment. Patient examined. All patient's and/or POA's questions/concerns addressed on today's visit. Mycotic toenails 1-5 debrided in length and girth without incident. Continue soft, supportive shoe gear daily. Report any pedal injuries to medical professional. Call office if there are any quesitons/concerns. -For tinea pedis, Rx sent to pharmacy for Ketoconazole  Cream 2% to be applied once daily for six weeks. -Patient/POA to call should there be question/concern in the interim.   Return in about 3 months (around 05/22/2024).  Delon LITTIE Merlin, DPM      [place] LOCATION: [address], [place] [address]                   Office [phone]   [facility] LOCATION: [address] [place], [place] [address] Office [phone]

## 2024-03-06 ENCOUNTER — Ambulatory Visit: Admitting: Podiatry

## 2024-04-16 ENCOUNTER — Encounter: Payer: Self-pay | Admitting: Radiology

## 2024-05-29 ENCOUNTER — Encounter: Payer: Self-pay | Admitting: Podiatry

## 2024-05-29 ENCOUNTER — Ambulatory Visit: Admitting: Podiatry

## 2024-05-29 DIAGNOSIS — M79671 Pain in right foot: Secondary | ICD-10-CM | POA: Diagnosis not present

## 2024-05-29 DIAGNOSIS — M79675 Pain in left toe(s): Secondary | ICD-10-CM | POA: Diagnosis not present

## 2024-05-29 DIAGNOSIS — B351 Tinea unguium: Secondary | ICD-10-CM

## 2024-05-29 DIAGNOSIS — M79674 Pain in right toe(s): Secondary | ICD-10-CM | POA: Diagnosis not present

## 2024-05-29 DIAGNOSIS — R7303 Prediabetes: Secondary | ICD-10-CM

## 2024-05-29 NOTE — Progress Notes (Signed)
°  Subjective:  [name], female    DOB: 02-21-1966,  MRN: [id number]  Shelby Wyatt presents to clinic today for painful elongated mycotic toenails 1-5 bilaterally which are tender when wearing enclosed shoe gear. Pain is relieved with periodic professional debridement.She has new concern as described below:  Chief Complaint  Patient presents with   Diabetes    Do my toenails.  This right foot is giving me problems.  The pain goes from the top of my foot to my ankles. Saw Vicci Barnie NOVAK, MD. - August 2025; A1c - 6.3 on 10/04/2023 N - pain top of foot L - dorsal lateral side D - 2 mos O - suddenly C - sharp pain, radiates to ankle A - flex my foot forward, put pressure on it T - none    PCP is Vicci Barnie NOVAK, MD.  Allergies[1]  Review of Systems: Negative except as noted in the HPI.  Objective: No changes noted in today's physical examination. There were no vitals filed for this visit. Shelby Wyatt is a pleasant 58 y.o. female in NAD. AAO x 3.  Vascular Examination: Vascular status intact b/l with palpable pedal pulses. CFT immediate b/l. Pedal hair present. No edema. No pain with calf compression b/l. Skin temperature gradient WNL b/l. No varicosities noted. No cyanosis or clubbing noted.  Neurological Examination: Sensation grossly intact b/l with 10 gram monofilament. Vibratory sensation intact b/l.  Dermatological Examination: No open wounds nor interdigital macerations noted. Toenails 1-5 b/l thick, discolored, elongated with subungual debris and pain on dorsal palpation.   Resolved hyperkeratotic lesion(s) medial IPJ of left great toe and medial IPJ of right great toe and resolved porokeratotic lesion(s) submet head 4 left foot. No erythema, no edema, no drainage, no fluctuance.  Musculoskeletal Examination: Muscle strength 5/5 to b/l LE.  No pain, crepitus noted b/l. Patient has antalgic gait. Pain on palpation along 4th/5th  metatarsals extending proximal-lateral to lateral ankle joint. Trace edema lateral aspect right ankle joint. No warmth, no fluctuance.  Radiographs: None  Assessment/Plan: 1. Pain due to onychomycosis of toenails of both feet   2. Pain in right foot   3. Prediabetes   Patient was evaluated and treated. All patient's and/or POA's questions/concerns addressed on today's visit. Toenails 1-5 b/l debrided in length and girth without incident. Continue soft, supportive shoe gear daily. Report any pedal injuries to medical professional. Call office if there are any questions/concerns. -Patient referred to Dr. Thresa Sar for evaluation of right foot/ankle pain. -Patient/POA to call should there be question/concern in the interim. Return in about 3 months (around 08/27/2024).  Shelby Wyatt, DPM      [place] LOCATION: [address] [place] [address]                   Office [phone]   [place] LOCATION: [address] [place], [place] [address] Office [phone]     [1]  Allergies Allergen Reactions   Augmentin  [Amoxicillin -Pot Clavulanate] Other (See Comments)    Significant abdominal pain, diarrhea, following Augmentin  specifically (not penicillin allergy)   Lisinopril  Cough   Terbinafine  And Related Hives

## 2024-05-30 ENCOUNTER — Ambulatory Visit: Admitting: Podiatry

## 2024-05-30 ENCOUNTER — Encounter: Payer: Self-pay | Admitting: Podiatry

## 2024-05-30 ENCOUNTER — Ambulatory Visit (INDEPENDENT_AMBULATORY_CARE_PROVIDER_SITE_OTHER)

## 2024-05-30 VITALS — Ht 62.0 in | Wt 184.4 lb

## 2024-05-30 DIAGNOSIS — M722 Plantar fascial fibromatosis: Secondary | ICD-10-CM

## 2024-05-30 DIAGNOSIS — M79671 Pain in right foot: Secondary | ICD-10-CM

## 2024-05-30 MED ORDER — MELOXICAM 15 MG PO TABS: 15.0000 mg | ORAL_TABLET | Freq: Every day | ORAL | 1 refills | Status: DC

## 2024-06-04 ENCOUNTER — Telehealth: Payer: Self-pay | Admitting: Podiatry

## 2024-06-04 ENCOUNTER — Encounter: Payer: Self-pay | Admitting: Podiatry

## 2024-06-04 DIAGNOSIS — Z0279 Encounter for issue of other medical certificate: Secondary | ICD-10-CM

## 2024-06-04 NOTE — Telephone Encounter (Signed)
 cld to adv pt of 25 for forms from [employer]. She will pay after lunch. I adv would fax [employer] and email her today her copy of what I fax She is out of work until 06/11/24.

## 2024-06-05 ENCOUNTER — Telehealth: Payer: Self-pay | Admitting: Podiatry

## 2024-06-05 NOTE — Telephone Encounter (Signed)
 Faxed [employer] [phone] forms RTW 06/11/24

## 2024-06-12 DIAGNOSIS — M722 Plantar fascial fibromatosis: Secondary | ICD-10-CM | POA: Diagnosis not present

## 2024-06-12 MED ORDER — BETAMETHASONE SOD PHOS & ACET 6 (3-3) MG/ML IJ SUSP
3.0000 mg | Freq: Once | INTRAMUSCULAR | Status: AC
  Administered 2024-06-12: 3 mg via INTRA_ARTICULAR

## 2024-06-12 NOTE — Progress Notes (Signed)
 "  Chief Complaint  Patient presents with   Foot Pain    Pt is here due to right foot pain, this has been going on for a few months she states sometimes its hard to walk on.    HPI: 58 y.o. female presenting today for right heel pain ongoing for several months.  Past Medical History:  Diagnosis Date   Allergy    seasonal   Anemia    Cancer (HCC)    colon, cancer dec 2022   Hypertension    No pertinent past medical history    Pre-diabetes    Tubal pregnancy     Past Surgical History:  Procedure Laterality Date   COLON SURGERY  12/22   Right hemicolectomy   COLONOSCOPY     COLONOSCOPY WITH PROPOFOL  N/A 04/07/2021   Procedure: COLONOSCOPY WITH PROPOFOL ;  Surgeon: Wilhelmenia Aloha Raddle., MD;  Location: [facility];  Service: Gastroenterology;  Laterality: N/A;   ENDOSCOPIC MUCOSAL RESECTION N/A 04/07/2021   Procedure: ENDOSCOPIC MUCOSAL RESECTION;  Surgeon: Wilhelmenia Aloha Raddle., MD;  Location: [facility];  Service: Gastroenterology;  Laterality: N/A;   HEMOSTASIS CLIP PLACEMENT  04/07/2021   Procedure: HEMOSTASIS CLIP PLACEMENT;  Surgeon: Wilhelmenia Aloha Raddle., MD;  Location: [facility];  Service: Gastroenterology;;   LAPAROSCOPIC RIGHT HEMI COLECTOMY Right 05/27/2021   Procedure: LAPAROSCOPIC RIGHT HEMI COLECTOMY;  Surgeon: Teresa Lonni HERO, MD;  Location: [facility];  Service: General;  Laterality: Right;   laporoscopy     MOUTH SURGERY     SUBMUCOSAL LIFTING INJECTION  04/07/2021   Procedure: SUBMUCOSAL LIFTING INJECTION;  Surgeon: Wilhelmenia Aloha Raddle., MD;  Location: [facility];  Service: Gastroenterology;;   TUBAL LIGATION     WISDOM TOOTH EXTRACTION      Allergies[1]   Physical Exam: General: The patient is alert and oriented x3 in no acute distress.  Dermatology: Skin is warm, dry and supple bilateral lower extremities.   Vascular: Palpable pedal pulses bilaterally. Capillary refill within normal limits.  No appreciable edema.  No  erythema.  Neurological: Grossly intact via light touch  Musculoskeletal Exam: Tenderness to palpation noted to the plantar fascia right  Radiographic Exam RT foot 05/30/2024:  Normal osseous mineralization. Joint spaces preserved.  No fractures or osseous irregularities noted.  Plantar heel spur noted on lateral view.  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  Plantar fasciitis right  -Injection of 0.5 cc Celestone  Soluspan injected in the plantar fascia right -Medrol  Dosepak -Meloxicam  15 mg daily after completion of the Dosepak -Recommend supportive shoes and arch supports.  Refrain from going barefoot -Recommend daily stretching exercises -Return to clinic 4 weeks       Thresa EMERSON Sar, DPM [facility]  Dr. Thresa EMERSON Sar, DPM    [address] [place] [address]                Office [phone]  Fax [phone]        [1]  Allergies Allergen Reactions   Augmentin  [Amoxicillin -Pot Clavulanate] Other (See Comments)    Significant abdominal pain, diarrhea, following Augmentin  specifically (not penicillin allergy)   Lisinopril  Cough   Terbinafine  And Related Hives   "

## 2024-07-04 ENCOUNTER — Encounter: Payer: Self-pay | Admitting: Podiatry

## 2024-07-04 ENCOUNTER — Ambulatory Visit: Admitting: Podiatry

## 2024-07-04 VITALS — Ht 62.0 in | Wt 184.4 lb

## 2024-07-04 DIAGNOSIS — M722 Plantar fascial fibromatosis: Secondary | ICD-10-CM | POA: Diagnosis not present

## 2024-07-04 NOTE — Progress Notes (Signed)
" ° °  Chief Complaint  Patient presents with   Plantar Fasciitis    Pt is here to f/u on right foot due to pain.    HPI: 59 y.o. female presenting today for follow-up evaluation of plantar fasciitis right foot  Past Medical History:  Diagnosis Date   Allergy    seasonal   Anemia    Cancer (HCC)    colon, cancer dec 2022   Hypertension    No pertinent past medical history    Pre-diabetes    Tubal pregnancy     Past Surgical History:  Procedure Laterality Date   COLON SURGERY  12/22   Right hemicolectomy   COLONOSCOPY     COLONOSCOPY WITH PROPOFOL  N/A 04/07/2021   Procedure: COLONOSCOPY WITH PROPOFOL ;  Surgeon: Wilhelmenia Aloha Raddle., MD;  Location: [facility];  Service: Gastroenterology;  Laterality: N/A;   ENDOSCOPIC MUCOSAL RESECTION N/A 04/07/2021   Procedure: ENDOSCOPIC MUCOSAL RESECTION;  Surgeon: Wilhelmenia Aloha Raddle., MD;  Location: [facility];  Service: Gastroenterology;  Laterality: N/A;   HEMOSTASIS CLIP PLACEMENT  04/07/2021   Procedure: HEMOSTASIS CLIP PLACEMENT;  Surgeon: Wilhelmenia Aloha Raddle., MD;  Location: [facility];  Service: Gastroenterology;;   LAPAROSCOPIC RIGHT HEMI COLECTOMY Right 05/27/2021   Procedure: LAPAROSCOPIC RIGHT HEMI COLECTOMY;  Surgeon: Teresa Lonni HERO, MD;  Location: [facility];  Service: General;  Laterality: Right;   laporoscopy     MOUTH SURGERY     SUBMUCOSAL LIFTING INJECTION  04/07/2021   Procedure: SUBMUCOSAL LIFTING INJECTION;  Surgeon: Wilhelmenia Aloha Raddle., MD;  Location: [facility];  Service: Gastroenterology;;   TUBAL LIGATION     WISDOM TOOTH EXTRACTION      Allergies[1]   Physical Exam: General: The patient is alert and oriented x3 in no acute distress.  Dermatology: Skin is warm, dry and supple bilateral lower extremities.   Vascular: Palpable pedal pulses bilaterally. Capillary refill within normal limits.  No appreciable edema.  No erythema.  Neurological: Grossly intact via light  touch  Musculoskeletal Exam: Minimal tenderness to palpation noted to the plantar fascia right  Radiographic Exam RT foot 05/30/2024:  Normal osseous mineralization. Joint spaces preserved.  No fractures or osseous irregularities noted.  Plantar heel spur noted on lateral view.  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  Plantar fasciitis right  - Overall significant improvement.  She no longer has the severe pain that she was experiencing -Continue wearing good supportive tennis shoes and sneakers -Refrain from going barefoot -Follow-up in office next scheduled appointment for routine footcare with Dr. May Thresa EMERSON Janit, DPM [facility]  Dr. Thresa EMERSON Janit, DPM    [address] [place] [address]                Office [phone]  Fax [phone]        [1]  Allergies Allergen Reactions   Augmentin  [Amoxicillin -Pot Clavulanate] Other (See Comments)    Significant abdominal pain, diarrhea, following Augmentin  specifically (not penicillin allergy)   Lisinopril  Cough   Terbinafine  And Related Hives   "

## 2024-07-11 ENCOUNTER — Other Ambulatory Visit: Payer: Self-pay | Admitting: Internal Medicine

## 2024-07-11 ENCOUNTER — Encounter (HOSPITAL_COMMUNITY): Payer: Self-pay

## 2024-07-11 ENCOUNTER — Other Ambulatory Visit (HOSPITAL_COMMUNITY): Payer: Self-pay

## 2024-07-11 DIAGNOSIS — I10 Essential (primary) hypertension: Secondary | ICD-10-CM

## 2024-07-11 DIAGNOSIS — R7303 Prediabetes: Secondary | ICD-10-CM

## 2024-07-11 MED ORDER — VALSARTAN 40 MG PO TABS
40.0000 mg | ORAL_TABLET | Freq: Every day | ORAL | 0 refills | Status: AC
  Filled 2024-07-11: qty 30, 30d supply, fill #0

## 2024-07-11 MED ORDER — METFORMIN HCL 500 MG PO TABS
500.0000 mg | ORAL_TABLET | Freq: Two times a day (BID) | ORAL | 0 refills | Status: AC
  Filled 2024-07-11: qty 60, 30d supply, fill #0

## 2024-07-11 MED ORDER — AMLODIPINE BESYLATE 10 MG PO TABS
10.0000 mg | ORAL_TABLET | Freq: Every day | ORAL | 0 refills | Status: AC
  Filled 2024-07-11: qty 30, 30d supply, fill #0

## 2024-07-11 MED ORDER — ATORVASTATIN CALCIUM 10 MG PO TABS
10.0000 mg | ORAL_TABLET | Freq: Every day | ORAL | 0 refills | Status: AC
  Filled 2024-07-11: qty 30, 30d supply, fill #0

## 2024-09-12 ENCOUNTER — Ambulatory Visit: Admitting: Podiatry
